# Patient Record
Sex: Female | Born: 1955 | Race: White | Hispanic: No | Smoking: Former smoker
Health system: Southern US, Community
[De-identification: ages and names within clinical notes are randomized; demographics above are authoritative.]

## PROBLEM LIST (undated history)

## (undated) DIAGNOSIS — K219 Gastro-esophageal reflux disease without esophagitis: Secondary | ICD-10-CM

## (undated) DIAGNOSIS — I10 Essential (primary) hypertension: Secondary | ICD-10-CM

## (undated) DIAGNOSIS — M1990 Unspecified osteoarthritis, unspecified site: Secondary | ICD-10-CM

## (undated) DIAGNOSIS — F419 Anxiety disorder, unspecified: Secondary | ICD-10-CM

## (undated) DIAGNOSIS — S2239XA Fracture of one rib, unspecified side, initial encounter for closed fracture: Secondary | ICD-10-CM

## (undated) DIAGNOSIS — N3281 Overactive bladder: Secondary | ICD-10-CM

## (undated) DIAGNOSIS — E7800 Pure hypercholesterolemia, unspecified: Secondary | ICD-10-CM

## (undated) DIAGNOSIS — S42409A Unspecified fracture of lower end of unspecified humerus, initial encounter for closed fracture: Secondary | ICD-10-CM

## (undated) DIAGNOSIS — F329 Major depressive disorder, single episode, unspecified: Secondary | ICD-10-CM

## (undated) DIAGNOSIS — M549 Dorsalgia, unspecified: Secondary | ICD-10-CM

## (undated) DIAGNOSIS — F32A Depression, unspecified: Secondary | ICD-10-CM

## (undated) HISTORY — DX: Gastro-esophageal reflux disease without esophagitis: K21.9

## (undated) HISTORY — DX: Overactive bladder: N32.81

## (undated) HISTORY — DX: Depression, unspecified: F32.A

## (undated) HISTORY — PX: TUBAL LIGATION: SHX77

## (undated) HISTORY — DX: Anxiety disorder, unspecified: F41.9

## (undated) HISTORY — DX: Major depressive disorder, single episode, unspecified: F32.9

## (undated) HISTORY — DX: Fracture of one rib, unspecified side, initial encounter for closed fracture: S22.39XA

## (undated) HISTORY — PX: HEMORRHOID SURGERY: SHX153

## (undated) HISTORY — DX: Unspecified osteoarthritis, unspecified site: M19.90

## (undated) HISTORY — DX: Dorsalgia, unspecified: M54.9

## (undated) HISTORY — DX: Essential (primary) hypertension: I10

## (undated) HISTORY — DX: Unspecified fracture of lower end of unspecified humerus, initial encounter for closed fracture: S42.409A

## (undated) HISTORY — PX: WRIST SURGERY: SHX841

---

## 1984-01-22 HISTORY — PX: ABDOMINAL HYSTERECTOMY: SHX81

## 1992-01-22 HISTORY — PX: CERVICAL DISC SURGERY: SHX588

## 2000-03-21 ENCOUNTER — Encounter: Payer: Self-pay | Admitting: Family Medicine

## 2000-03-21 LAB — CONVERTED CEMR LAB

## 2002-02-02 ENCOUNTER — Encounter: Payer: Self-pay | Admitting: General Surgery

## 2002-02-05 ENCOUNTER — Ambulatory Visit (HOSPITAL_COMMUNITY): Admission: RE | Admit: 2002-02-05 | Discharge: 2002-02-05 | Payer: Self-pay | Admitting: General Surgery

## 2002-02-05 ENCOUNTER — Encounter (INDEPENDENT_AMBULATORY_CARE_PROVIDER_SITE_OTHER): Payer: Self-pay | Admitting: *Deleted

## 2003-01-22 HISTORY — PX: ANKLE SURGERY: SHX546

## 2003-12-13 ENCOUNTER — Ambulatory Visit: Payer: Self-pay | Admitting: Professional

## 2004-09-27 ENCOUNTER — Ambulatory Visit: Payer: Self-pay | Admitting: Family Medicine

## 2005-03-03 ENCOUNTER — Emergency Department: Payer: Self-pay | Admitting: General Practice

## 2005-03-11 ENCOUNTER — Emergency Department (HOSPITAL_COMMUNITY): Admission: EM | Admit: 2005-03-11 | Discharge: 2005-03-11 | Payer: Self-pay | Admitting: Emergency Medicine

## 2005-03-12 ENCOUNTER — Emergency Department (HOSPITAL_COMMUNITY): Admission: EM | Admit: 2005-03-12 | Discharge: 2005-03-12 | Payer: Self-pay | Admitting: *Deleted

## 2005-04-16 ENCOUNTER — Ambulatory Visit: Payer: Self-pay | Admitting: Family Medicine

## 2005-05-17 ENCOUNTER — Ambulatory Visit: Payer: Self-pay | Admitting: Family Medicine

## 2005-07-05 ENCOUNTER — Ambulatory Visit: Payer: Self-pay | Admitting: Family Medicine

## 2005-11-25 ENCOUNTER — Ambulatory Visit: Payer: Self-pay | Admitting: Family Medicine

## 2005-12-02 ENCOUNTER — Ambulatory Visit: Payer: Self-pay | Admitting: Family Medicine

## 2005-12-25 ENCOUNTER — Ambulatory Visit: Payer: Self-pay | Admitting: Family Medicine

## 2006-04-07 ENCOUNTER — Ambulatory Visit: Payer: Self-pay | Admitting: Family Medicine

## 2006-06-19 ENCOUNTER — Emergency Department: Payer: Self-pay | Admitting: General Practice

## 2006-06-26 ENCOUNTER — Encounter: Payer: Self-pay | Admitting: Family Medicine

## 2006-06-26 DIAGNOSIS — F411 Generalized anxiety disorder: Secondary | ICD-10-CM

## 2006-06-26 DIAGNOSIS — I10 Essential (primary) hypertension: Secondary | ICD-10-CM | POA: Insufficient documentation

## 2006-06-26 DIAGNOSIS — K649 Unspecified hemorrhoids: Secondary | ICD-10-CM | POA: Insufficient documentation

## 2006-06-26 DIAGNOSIS — F329 Major depressive disorder, single episode, unspecified: Secondary | ICD-10-CM

## 2006-06-26 DIAGNOSIS — K219 Gastro-esophageal reflux disease without esophagitis: Secondary | ICD-10-CM

## 2006-06-27 ENCOUNTER — Ambulatory Visit: Payer: Self-pay | Admitting: Family Medicine

## 2006-06-27 DIAGNOSIS — N951 Menopausal and female climacteric states: Secondary | ICD-10-CM | POA: Insufficient documentation

## 2006-07-01 ENCOUNTER — Ambulatory Visit: Payer: Self-pay | Admitting: Family Medicine

## 2006-07-04 ENCOUNTER — Telehealth (INDEPENDENT_AMBULATORY_CARE_PROVIDER_SITE_OTHER): Payer: Self-pay | Admitting: *Deleted

## 2006-07-29 ENCOUNTER — Ambulatory Visit: Payer: Self-pay | Admitting: Family Medicine

## 2006-07-29 LAB — CONVERTED CEMR LAB
ALT: 11 units/L (ref 0–35)
AST: 15 units/L (ref 0–37)
Albumin: 3.6 g/dL (ref 3.5–5.2)
Alkaline Phosphatase: 91 units/L (ref 39–117)
BUN: 10 mg/dL (ref 6–23)
Basophils Absolute: 0.1 10*3/uL (ref 0.0–0.1)
Basophils Relative: 1.3 % — ABNORMAL HIGH (ref 0.0–1.0)
Bilirubin, Direct: 0.1 mg/dL (ref 0.0–0.3)
CO2: 29 meq/L (ref 19–32)
Calcium: 8.8 mg/dL (ref 8.4–10.5)
Chloride: 110 meq/L (ref 96–112)
Cholesterol: 264 mg/dL (ref 0–200)
Creatinine, Ser: 1 mg/dL (ref 0.4–1.2)
Direct LDL: 199.5 mg/dL
Eosinophils Absolute: 0.3 10*3/uL (ref 0.0–0.6)
Eosinophils Relative: 4.6 % (ref 0.0–5.0)
GFR calc Af Amer: 76 mL/min
GFR calc non Af Amer: 63 mL/min
Glucose, Bld: 80 mg/dL (ref 70–99)
HCT: 37.2 % (ref 36.0–46.0)
HDL: 28.3 mg/dL — ABNORMAL LOW (ref >39.0)
Hemoglobin: 13 g/dL (ref 12.0–15.0)
Lymphocytes Relative: 46.5 % — ABNORMAL HIGH (ref 12.0–46.0)
MCHC: 35 g/dL (ref 30.0–36.0)
MCV: 94.4 fL (ref 78.0–100.0)
Monocytes Absolute: 0.7 10*3/uL (ref 0.2–0.7)
Monocytes Relative: 9.6 % (ref 3.0–11.0)
Neutro Abs: 2.7 10*3/uL (ref 1.4–7.7)
Neutrophils Relative %: 38 % — ABNORMAL LOW (ref 43.0–77.0)
Platelets: 315 10*3/uL (ref 150–400)
Potassium: 4.4 meq/L (ref 3.5–5.1)
RBC: 3.94 M/uL (ref 3.87–5.11)
RDW: 12.2 % (ref 11.5–14.6)
Sodium: 141 meq/L (ref 135–145)
TSH: 3.49 microintl units/mL (ref 0.35–5.50)
Total Bilirubin: 0.4 mg/dL (ref 0.3–1.2)
Total CHOL/HDL Ratio: 9.3
Total Protein: 6.4 g/dL (ref 6.0–8.3)
Triglycerides: 195 mg/dL — ABNORMAL HIGH (ref 0–149)
VLDL: 39 mg/dL (ref 0–40)
WBC: 7 10*3/uL (ref 4.5–10.5)

## 2006-07-31 ENCOUNTER — Encounter: Payer: Self-pay | Admitting: Family Medicine

## 2006-07-31 ENCOUNTER — Ambulatory Visit: Payer: Self-pay | Admitting: Family Medicine

## 2006-07-31 ENCOUNTER — Other Ambulatory Visit: Admission: RE | Admit: 2006-07-31 | Discharge: 2006-07-31 | Payer: Self-pay | Admitting: Family Medicine

## 2006-07-31 DIAGNOSIS — E7800 Pure hypercholesterolemia, unspecified: Secondary | ICD-10-CM

## 2006-07-31 DIAGNOSIS — E782 Mixed hyperlipidemia: Secondary | ICD-10-CM | POA: Insufficient documentation

## 2006-08-06 ENCOUNTER — Encounter (INDEPENDENT_AMBULATORY_CARE_PROVIDER_SITE_OTHER): Payer: Self-pay | Admitting: *Deleted

## 2006-08-25 ENCOUNTER — Telehealth (INDEPENDENT_AMBULATORY_CARE_PROVIDER_SITE_OTHER): Payer: Self-pay | Admitting: *Deleted

## 2006-08-27 ENCOUNTER — Encounter: Payer: Self-pay | Admitting: Family Medicine

## 2006-08-27 ENCOUNTER — Ambulatory Visit: Payer: Self-pay | Admitting: Family Medicine

## 2006-08-29 ENCOUNTER — Encounter (INDEPENDENT_AMBULATORY_CARE_PROVIDER_SITE_OTHER): Payer: Self-pay | Admitting: *Deleted

## 2006-09-02 ENCOUNTER — Ambulatory Visit: Payer: Self-pay | Admitting: Family Medicine

## 2006-09-02 ENCOUNTER — Telehealth (INDEPENDENT_AMBULATORY_CARE_PROVIDER_SITE_OTHER): Payer: Self-pay | Admitting: *Deleted

## 2006-09-08 LAB — CONVERTED CEMR LAB
ALT: 13 units/L (ref 0–35)
AST: 13 units/L (ref 0–37)

## 2006-09-16 ENCOUNTER — Telehealth: Payer: Self-pay | Admitting: Family Medicine

## 2006-10-30 ENCOUNTER — Ambulatory Visit: Payer: Self-pay | Admitting: Family Medicine

## 2006-10-30 LAB — CONVERTED CEMR LAB
ALT: 46 units/L — ABNORMAL HIGH (ref 0–35)
AST: 36 units/L (ref 0–37)
BUN: 8 mg/dL (ref 6–23)
CO2: 28 meq/L (ref 19–32)
Calcium: 9.1 mg/dL (ref 8.4–10.5)
Chloride: 106 meq/L (ref 96–112)
Cholesterol: 143 mg/dL (ref 0–200)
Creatinine, Ser: 1 mg/dL (ref 0.4–1.2)
GFR calc Af Amer: 76 mL/min
GFR calc non Af Amer: 63 mL/min
Glucose, Bld: 94 mg/dL (ref 70–99)
HDL: 26.9 mg/dL — ABNORMAL LOW (ref >39.0)
LDL Cholesterol: 92 mg/dL (ref 0–99)
Potassium: 3.9 meq/L (ref 3.5–5.1)
Sodium: 140 meq/L (ref 135–145)
Total CHOL/HDL Ratio: 5.3
Triglycerides: 123 mg/dL (ref 0–149)
VLDL: 25 mg/dL (ref 0–40)

## 2006-11-03 ENCOUNTER — Ambulatory Visit: Payer: Self-pay | Admitting: Family Medicine

## 2006-12-25 ENCOUNTER — Ambulatory Visit: Payer: Self-pay | Admitting: Family Medicine

## 2006-12-31 ENCOUNTER — Ambulatory Visit: Payer: Self-pay | Admitting: Family Medicine

## 2006-12-31 DIAGNOSIS — G8929 Other chronic pain: Secondary | ICD-10-CM | POA: Insufficient documentation

## 2006-12-31 DIAGNOSIS — M549 Dorsalgia, unspecified: Secondary | ICD-10-CM | POA: Insufficient documentation

## 2007-02-03 ENCOUNTER — Telehealth (INDEPENDENT_AMBULATORY_CARE_PROVIDER_SITE_OTHER): Payer: Self-pay | Admitting: *Deleted

## 2007-03-16 ENCOUNTER — Telehealth: Payer: Self-pay | Admitting: Family Medicine

## 2007-03-17 ENCOUNTER — Telehealth: Payer: Self-pay | Admitting: Family Medicine

## 2007-03-25 ENCOUNTER — Ambulatory Visit: Payer: Self-pay | Admitting: Family Medicine

## 2007-04-02 ENCOUNTER — Ambulatory Visit: Payer: Self-pay | Admitting: Family Medicine

## 2007-04-20 ENCOUNTER — Ambulatory Visit: Payer: Self-pay | Admitting: Family Medicine

## 2007-05-08 ENCOUNTER — Ambulatory Visit: Payer: Self-pay | Admitting: Family Medicine

## 2007-06-17 ENCOUNTER — Encounter: Payer: Self-pay | Admitting: Family Medicine

## 2007-07-03 ENCOUNTER — Telehealth: Payer: Self-pay | Admitting: Family Medicine

## 2007-07-13 ENCOUNTER — Telehealth: Payer: Self-pay | Admitting: Family Medicine

## 2007-07-27 ENCOUNTER — Encounter (INDEPENDENT_AMBULATORY_CARE_PROVIDER_SITE_OTHER): Payer: Self-pay | Admitting: *Deleted

## 2007-08-07 ENCOUNTER — Telehealth: Payer: Self-pay | Admitting: Family Medicine

## 2007-09-17 ENCOUNTER — Telehealth (INDEPENDENT_AMBULATORY_CARE_PROVIDER_SITE_OTHER): Payer: Self-pay | Admitting: *Deleted

## 2007-10-22 ENCOUNTER — Encounter: Payer: Self-pay | Admitting: Family Medicine

## 2007-11-21 ENCOUNTER — Emergency Department: Payer: Self-pay | Admitting: Emergency Medicine

## 2007-12-10 ENCOUNTER — Encounter: Payer: Self-pay | Admitting: Family Medicine

## 2007-12-15 ENCOUNTER — Ambulatory Visit: Payer: Self-pay | Admitting: Family Medicine

## 2007-12-15 LAB — CONVERTED CEMR LAB
ALT: 16 units/L (ref 0–35)
AST: 16 units/L (ref 0–37)
Albumin: 3.8 g/dL (ref 3.5–5.2)
Alkaline Phosphatase: 83 units/L (ref 39–117)
BUN: 12 mg/dL (ref 6–23)
Basophils Absolute: 0 10*3/uL (ref 0.0–0.1)
Basophils Relative: 0.4 % (ref 0.0–3.0)
Bilirubin, Direct: 0.1 mg/dL (ref 0.0–0.3)
CO2: 27 meq/L (ref 19–32)
Calcium: 9.1 mg/dL (ref 8.4–10.5)
Chloride: 106 meq/L (ref 96–112)
Cholesterol: 147 mg/dL (ref 0–200)
Creatinine, Ser: 1.1 mg/dL (ref 0.4–1.2)
Eosinophils Absolute: 0.1 10*3/uL (ref 0.0–0.7)
Eosinophils Relative: 1.3 % (ref 0.0–5.0)
GFR calc Af Amer: 68 mL/min
GFR calc non Af Amer: 56 mL/min
Glucose, Bld: 92 mg/dL (ref 70–99)
HCT: 39.6 % (ref 36.0–46.0)
HDL: 37.9 mg/dL — ABNORMAL LOW (ref >39.0)
Hemoglobin: 13.6 g/dL (ref 12.0–15.0)
LDL Cholesterol: 87 mg/dL (ref 0–99)
Lymphocytes Relative: 15.9 % (ref 12.0–46.0)
MCHC: 34.4 g/dL (ref 30.0–36.0)
MCV: 94.1 fL (ref 78.0–100.0)
Monocytes Absolute: 0.6 10*3/uL (ref 0.1–1.0)
Monocytes Relative: 7.2 % (ref 3.0–12.0)
Neutro Abs: 6.5 10*3/uL (ref 1.4–7.7)
Neutrophils Relative %: 75.2 % (ref 43.0–77.0)
Platelets: 180 10*3/uL (ref 150–400)
Potassium: 4.3 meq/L (ref 3.5–5.1)
RBC: 4.21 M/uL (ref 3.87–5.11)
RDW: 11.9 % (ref 11.5–14.6)
Sodium: 138 meq/L (ref 135–145)
TSH: 1.4 microintl units/mL (ref 0.35–5.50)
Total Bilirubin: 0.9 mg/dL (ref 0.3–1.2)
Total CHOL/HDL Ratio: 3.9
Total Protein: 7.4 g/dL (ref 6.0–8.3)
Triglycerides: 109 mg/dL (ref 0–149)
VLDL: 22 mg/dL (ref 0–40)
WBC: 8.6 10*3/uL (ref 4.5–10.5)

## 2007-12-23 ENCOUNTER — Ambulatory Visit: Payer: Self-pay | Admitting: Family Medicine

## 2007-12-23 DIAGNOSIS — K921 Melena: Secondary | ICD-10-CM | POA: Insufficient documentation

## 2008-01-05 ENCOUNTER — Telehealth: Payer: Self-pay | Admitting: Family Medicine

## 2008-01-11 ENCOUNTER — Encounter: Payer: Self-pay | Admitting: Family Medicine

## 2008-01-14 ENCOUNTER — Encounter (INDEPENDENT_AMBULATORY_CARE_PROVIDER_SITE_OTHER): Payer: Self-pay | Admitting: *Deleted

## 2008-01-14 ENCOUNTER — Ambulatory Visit: Payer: Self-pay | Admitting: Family Medicine

## 2008-01-14 LAB — CONVERTED CEMR LAB
OCCULT 1: POSITIVE
OCCULT 2: NEGATIVE
OCCULT 3: NEGATIVE

## 2008-03-09 ENCOUNTER — Telehealth: Payer: Self-pay | Admitting: Family Medicine

## 2008-07-04 ENCOUNTER — Telehealth: Payer: Self-pay | Admitting: Family Medicine

## 2008-10-27 ENCOUNTER — Telehealth: Payer: Self-pay | Admitting: Family Medicine

## 2008-11-04 ENCOUNTER — Encounter: Payer: Self-pay | Admitting: Family Medicine

## 2008-12-29 ENCOUNTER — Emergency Department: Payer: Self-pay | Admitting: Emergency Medicine

## 2008-12-29 ENCOUNTER — Encounter: Payer: Self-pay | Admitting: Family Medicine

## 2008-12-30 ENCOUNTER — Encounter: Payer: Self-pay | Admitting: Family Medicine

## 2009-01-12 ENCOUNTER — Ambulatory Visit: Payer: Self-pay | Admitting: Family Medicine

## 2009-01-18 ENCOUNTER — Telehealth: Payer: Self-pay | Admitting: Family Medicine

## 2009-01-23 ENCOUNTER — Telehealth: Payer: Self-pay | Admitting: Family Medicine

## 2009-01-30 ENCOUNTER — Telehealth: Payer: Self-pay | Admitting: Family Medicine

## 2009-02-27 ENCOUNTER — Telehealth: Payer: Self-pay | Admitting: Family Medicine

## 2009-04-06 ENCOUNTER — Encounter: Payer: Self-pay | Admitting: Family Medicine

## 2009-04-27 ENCOUNTER — Encounter: Payer: Self-pay | Admitting: Family Medicine

## 2009-05-01 ENCOUNTER — Encounter: Payer: Self-pay | Admitting: Family Medicine

## 2009-05-17 ENCOUNTER — Ambulatory Visit: Payer: Self-pay | Admitting: Internal Medicine

## 2009-05-25 ENCOUNTER — Encounter: Payer: Self-pay | Admitting: Family Medicine

## 2009-05-25 ENCOUNTER — Telehealth: Payer: Self-pay | Admitting: Family Medicine

## 2009-06-29 ENCOUNTER — Encounter: Payer: Self-pay | Admitting: Family Medicine

## 2009-08-18 ENCOUNTER — Telehealth: Payer: Self-pay | Admitting: Family Medicine

## 2009-08-28 ENCOUNTER — Telehealth: Payer: Self-pay | Admitting: Family Medicine

## 2009-08-29 ENCOUNTER — Encounter (INDEPENDENT_AMBULATORY_CARE_PROVIDER_SITE_OTHER): Payer: Self-pay | Admitting: *Deleted

## 2009-09-15 ENCOUNTER — Ambulatory Visit: Payer: Self-pay | Admitting: Family Medicine

## 2009-09-15 ENCOUNTER — Encounter: Payer: Self-pay | Admitting: Family Medicine

## 2009-10-30 ENCOUNTER — Telehealth: Payer: Self-pay | Admitting: Family Medicine

## 2009-11-20 ENCOUNTER — Telehealth: Payer: Self-pay | Admitting: Family Medicine

## 2009-12-05 ENCOUNTER — Encounter: Payer: Self-pay | Admitting: Family Medicine

## 2010-01-10 ENCOUNTER — Ambulatory Visit: Payer: Self-pay | Admitting: Family Medicine

## 2010-01-10 LAB — CONVERTED CEMR LAB
ALT: 18 units/L (ref 0–35)
AST: 18 units/L (ref 0–37)
BUN: 16 mg/dL (ref 6–23)
Basophils Relative: 0.8 % (ref 0.0–3.0)
Bilirubin, Direct: 0.1 mg/dL (ref 0.0–0.3)
Chloride: 109 meq/L (ref 96–112)
Eosinophils Relative: 4 % (ref 0.0–5.0)
GFR calc non Af Amer: 60.98 mL/min (ref >60.00)
HCT: 37.4 % (ref 36.0–46.0)
LDL Cholesterol: 72 mg/dL (ref 0–99)
Lymphs Abs: 2.5 10*3/uL (ref 0.7–4.0)
Monocytes Relative: 7.3 % (ref 3.0–12.0)
Neutrophils Relative %: 49.1 % (ref 43.0–77.0)
Platelets: 163 10*3/uL (ref 150.0–400.0)
Potassium: 3.7 meq/L (ref 3.5–5.1)
RBC: 3.9 M/uL (ref 3.87–5.11)
Total Bilirubin: 0.7 mg/dL (ref 0.3–1.2)
Total Protein: 6.7 g/dL (ref 6.0–8.3)
VLDL: 17.2 mg/dL (ref 0.0–40.0)
WBC: 6.4 10*3/uL (ref 4.5–10.5)

## 2010-01-17 ENCOUNTER — Telehealth: Payer: Self-pay | Admitting: Family Medicine

## 2010-01-18 ENCOUNTER — Ambulatory Visit: Payer: Self-pay | Admitting: Family Medicine

## 2010-01-18 ENCOUNTER — Other Ambulatory Visit
Admission: RE | Admit: 2010-01-18 | Discharge: 2010-01-18 | Payer: Self-pay | Source: Home / Self Care | Admitting: Family Medicine

## 2010-01-18 LAB — CONVERTED CEMR LAB: Pap Smear: NORMAL

## 2010-01-31 ENCOUNTER — Encounter (INDEPENDENT_AMBULATORY_CARE_PROVIDER_SITE_OTHER): Payer: Self-pay | Admitting: *Deleted

## 2010-02-06 ENCOUNTER — Encounter: Payer: Self-pay | Admitting: Family Medicine

## 2010-02-06 ENCOUNTER — Ambulatory Visit: Payer: Self-pay

## 2010-02-08 ENCOUNTER — Encounter: Payer: Self-pay | Admitting: Family Medicine

## 2010-02-08 ENCOUNTER — Ambulatory Visit: Payer: Self-pay

## 2010-02-15 ENCOUNTER — Telehealth (INDEPENDENT_AMBULATORY_CARE_PROVIDER_SITE_OTHER): Payer: Self-pay | Admitting: *Deleted

## 2010-02-20 NOTE — Progress Notes (Signed)
Summary: refill request for alprazolam  Phone Note Refill Request Message from:  Fax from Pharmacy  Refills Requested: Medication #1:  ALPRAZOLAM 1 MG TABS take one by mouth two times a day   Last Refilled: 07/22/2009 Faxed request from [facility], [phone].  Initial call taken by: Lowella Petties CMA,  August 18, 2009 3:04 PM  Follow-up for Phone Call        signed.  Follow-up by: Crawford Givens MD,  August 18, 2009 3:09 PM  Additional Follow-up for Phone Call Additional follow up Details #1::        Medication phoned to pharmacy.  Additional Follow-up by: Delilah Shan CMA Filipe Greathouse Dull),  August 18, 2009 4:41 PM    Prescriptions: ALPRAZOLAM 1 MG TABS (ALPRAZOLAM) take one by mouth two times a day  #60 x 2   Entered and Authorized by:   Crawford Givens MD   Signed by:   Crawford Givens MD on 08/18/2009   Method used:   Print then Give to Patient   RxID:   [id number]

## 2010-02-20 NOTE — Progress Notes (Signed)
Summary: [facility]  Phone Note Refill Request Message from:  Ozzie Hoyle [phone] on October 30, 2009 9:48 AM  faxed request for [facility] 15mg , not on patient active or non-active med list. Last filled 11/07/08   Method Requested: Electronic Initial call taken by: Mervin Hack CMA Duncan Dull),  October 30, 2009 9:49 AM  Follow-up for Phone Call        tried calling pt's number in chart, number has been disconnected. Will call the pharmacy to ask the pt to call the office. DeShannon Katrinka Blazing CMA Duncan Dull)  October 30, 2009 9:50 AM   Additional Follow-up for Phone Call Additional follow up Details #1::        I agree with the above.  This needs clarification before refilling it.  No reply to me needed, unless extra information becomes available.  Additional Follow-up by: Crawford Givens MD,  October 30, 2009 1:49 PM    Additional Follow-up for Phone Call Additional follow up Details #2::    Called and spoke to pharmacist Thayer Ohm) and was informed that this rx was transferred from [facility] on 11/07/08 and the rx had 6 refills when they got this rx from them and that the rx was written by Dr. Hetty Ely. Thayer Ohm states that they have refilled it 3 times since it was transferred and the refills have now expired. Unable to reach patient at phone numbers and requested that Thayer Ohm have patient call the office with more information regarding this refill request. Sydell Axon LPN  October 30, 2009 4:49 PM  Follow-up by: Crawford Givens MD,  October 30, 2009 5:01 PM  Additional Follow-up for Phone Call Additional follow up Details #3:: Details for Additional Follow-up Action Taken: agreed, will await further details.  Additional Follow-up by: Crawford Givens MD,  October 30, 2009 5:01 PM   Appended Document: [facility] Patient called in.  Phone number was changed in EMR.  Patient requests this Rx.  She has an appt. with Dr. Hetty Ely in December.  Appended Document: [facility] Will you refill this until December?  Appended Document:  [facility] See script sent to [facility].  Appended Document: [facility] Patient notified that script was sent to [facility] per Dr. Hetty Ely.

## 2010-02-20 NOTE — Progress Notes (Signed)
Summary: Rx-Xanax  Phone Note Refill Request Message from:  Fax from Pharmacy on February 27, 2009 9:00 AM  Refills Requested: Medication #1:  ALPRAZOLAM 1 MG TABS take one by mouth two times a day   Last Refilled: 01/28/2009 #60. [facility] [address] ph# [phone] fax# [phone]  Initial call taken by: Silas Sacramento CMA,  February 27, 2009 9:03 AM  Follow-up for Phone Call        Rx called to pharmacy Follow-up by: Sydell Axon LPN,  February 27, 2009 10:21 AM    Prescriptions: ALPRAZOLAM 1 MG TABS (ALPRAZOLAM) take one by mouth two times a day  #60 x 2   Entered and Authorized by:   Shaune Leeks MD   Signed by:   Shaune Leeks MD on 02/27/2009   Method used:   Telephoned to ...       [facility] [address] (retail)       [address]       [place], [place]  [address]       Ph: [phone]       Fax: [phone]   RxID:   [id number]

## 2010-02-20 NOTE — Medication Information (Signed)
Summary: [facility] [employer] [facility]-Accupril,Lipitor  [facility] Assistance [facility]-Accupril,Lipitor   Imported By: Beau Fanny 04/07/2009 09:16:56  _____________________________________________________________________  External Attachment:    Type:   Image     Comment:   External Document
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
## 2010-02-20 NOTE — Progress Notes (Signed)
Summary: refill request for mobic  Phone Note Refill Request Message from:  Fax from Pharmacy  Refills Requested: Medication #1:  mobic 15 mg's Faxed request from [facility], this is not on med list.  Initial call taken by: Lowella Petties CMA,  August 28, 2009 3:01 PM  Follow-up for Phone Call        not on med list.  please call patient and verify all meds and then send it back to me.  thanks.  Follow-up by: Crawford Givens MD,  August 28, 2009 9:58 PM  Additional Follow-up for Phone Call Additional follow up Details #1::        Pt.s VM states that her mailbox is full so I could not leave a message.  That is the only phone number we have for her.  I phoned the pharmacy and relayed this message to them and to ask her to call this office.  Lugene Fuquay CMA (AAMA)  August 29, 2009 10:14 AM

## 2010-02-20 NOTE — Medication Information (Signed)
Summary: [facility] Patient Application  [facility] Patient Application   Imported By: Beau Fanny 01/24/2009 15:47:39  _____________________________________________________________________  External Attachment:    Type:   Image     Comment:   External Document

## 2010-02-20 NOTE — Medication Information (Signed)
Summary: Armed forces technical officer to Care-notification pt has been accepted  Armed forces technical officer to Care-notification pt has been accepted   Imported By: Beau Fanny 07/11/2009 10:43:19  _____________________________________________________________________  External Attachment:    Type:   Image     Comment:   External Document

## 2010-02-20 NOTE — Letter (Signed)
Summary: Beth Parker letter  [place] at [facility]  [address] [place], [place] [address]   Phone: [phone]  Fax: [phone]       08/29/2009 MRN: [id number]  [facility] [address] [place], [place]  [address]  Dear Beth Parker,  [facility] [place] and [place] announce the retirement of Beth Parker, M.D., from full-time practice at the [facility] office effective July 20, 2009 and his plans of returning part-time.  It is important to Beth Parker and to our practice that you understand that [facility] has seven physicians in our office for your health care needs.  We will continue to offer the same exceptional care that you have today.    Beth Parker has spoken to many of you about his plans for retirement and returning part-time in the fall.   We will continue to work with you through the transition to schedule appointments for you in the office and meet the high standards that [facility] is committed to.   Again, it is with great pleasure that we share the news that Beth Parker will return to [facility] in October of 2011 with a reduced schedule.    If you have any questions, or would like to request an appointment with one of our physicians, please call us at [phone] and press the option for Scheduling an appointment.  We take pleasure in providing you with excellent patient care and look forward to seeing you at your next office visit.  Our [facility] Physicians are:  Beth Parker, M.D. Beth Parker, M.D. Beth Parker, M.D. Beth Parker, M.D. Beth Parker, M.D. Beth Parker, M.D. We proudly welcomed Beth Parker, M.D. and Beth Parker, M.D. to the practice in July/August 2011.  Sincerely,  Beth Parker [facility]

## 2010-02-20 NOTE — Miscellaneous (Signed)
  Clinical Lists Changes  Medications: Rx of ACCUPRIL 20 MG TABS (QUINAPRIL HCL) 1 DAILY BY MOUTH;  #90 x 3;  Signed;  Entered by: Shaune Leeks MD;  Authorized by: Shaune Leeks MD;  Method used: Print then Give to Patient Rx of LIPITOR 40 MG TABS (ATORVASTATIN CALCIUM) one tab by mouth at night.;  #90 x 3;  Signed;  Entered by: Shaune Leeks MD;  Authorized by: Shaune Leeks MD;  Method used: Print then Give to Patient    Prescriptions: LIPITOR 40 MG TABS (ATORVASTATIN CALCIUM) one tab by mouth at night.  #90 x 3   Entered and Authorized by:   Shaune Leeks MD   Signed by:   Shaune Leeks MD on 04/06/2009   Method used:   Print then Give to Patient   RxID:   [id number] ACCUPRIL 20 MG TABS (QUINAPRIL HCL) 1 DAILY BY MOUTH  #90 x 3   Entered and Authorized by:   Shaune Leeks MD   Signed by:   Shaune Leeks MD on 04/06/2009   Method used:   Print then Give to Patient   RxID:   [id number]

## 2010-02-20 NOTE — Progress Notes (Signed)
Summary: refill request for xanax  Phone Note Refill Request Message from:  Fax from Pharmacy  Refills Requested: Medication #1:  ALPRAZOLAM 1 MG TABS take one by mouth two times a day   Last Refilled: 10/21/2009 Faxed request from [place], [phone].  Initial call taken by: Lowella Petties CMA, AAMA,  November 20, 2009 9:16 AM  Follow-up for Phone Call        needs 30 min OV before next round of refills.  please call in.  Follow-up by: Crawford Givens MD,  November 20, 2009 9:38 AM  Additional Follow-up for Phone Call Additional follow up Details #1::        The person that answered this phone says that this is not [facility]'s number.  Left message at pharmacy for patient to return call.  Delilah Shan CMA Duncan Dull)  November 20, 2009 10:13 AM   Patient returned call.  Phone number changed in EMR.  Pt. has appt. with Dr. Hetty Ely in December.  Medication phoned to pharmacy. Lugene Fuquay CMA (AAMA)  November 21, 2009 9:10 AM     Prescriptions: ALPRAZOLAM 1 MG TABS (ALPRAZOLAM) take one by mouth two times a day  #60 x 2   Entered and Authorized by:   Crawford Givens MD   Signed by:   Crawford Givens MD on 11/20/2009   Method used:   Telephoned to ...         RxID:   [id number]

## 2010-02-20 NOTE — Medication Information (Signed)
Summary: [facility] Patient Assistance Program Form  [employer] Patient Assistance [employer] Form   Imported By: Beau Fanny 12/20/2009 13:46:09  _____________________________________________________________________  External Attachment:    Type:   Image     Comment:   External Document

## 2010-02-20 NOTE — Letter (Signed)
Summary: [employer] notification of pt's appt. w/   [employer]ability [employer] Services notification of pt's appt. w/ Dr. Toy Cookey   Imported By: Beau Fanny 05/26/2009 09:59:16  _____________________________________________________________________  External Attachment:    Type:   Image     Comment:   External Document

## 2010-02-20 NOTE — Progress Notes (Signed)
Summary: RX Alprazolam  Phone Note Refill Request Call back at [phone] Message from:  [facility] on January 30, 2009 9:36 AM  Refills Requested: Medication #1:  ALPRAZOLAM 1 MG TABS take one by mouth two times a day   Last Refilled: 12/28/2008 Received faxed refill request.   Method Requested: Telephone to Pharmacy Initial call taken by: Sydell Axon LPN,  January 30, 2009 9:37 AM  Follow-up for Phone Call        she got 60 on 12/23 (directions are for two times a day )-- ? is early to refill Follow-up by: Judith Part MD,  January 30, 2009 10:26 AM  Additional Follow-up for Phone Call Additional follow up Details #1::        Spoke to pharmacist and was informed that patient did get #60 on 01/12/09. Pharmacist Rosanne Ashing) will inform patient that refill is too early. Sydell Axon LPN  January 30, 2009 11:09 AM

## 2010-02-20 NOTE — Medication Information (Signed)
Summary: [facility] Form-Protonix  [facility] Form-Protonix   Imported By: Beau Fanny 05/02/2009 10:22:50  _____________________________________________________________________  External Attachment:    Type:   Image     Comment:   External Document

## 2010-02-20 NOTE — Progress Notes (Signed)
Summary: Rx Alprazolam  Phone Note Refill Request Call back at [phone] Message from:  [facility] on May 25, 2009 8:13 AM  Refills Requested: Medication #1:  ALPRAZOLAM 1 MG TABS take one by mouth two times a day Received faxed refill request please advise.   Method Requested: Telephone to Pharmacy Initial call taken by: Linde Gillis CMA Duncan Dull),  May 25, 2009 8:14 AM  Follow-up for Phone Call        Rx called to [facility] pharmacy Follow-up by: Linde Gillis CMA Duncan Dull),  May 25, 2009 2:11 PM    Prescriptions: ALPRAZOLAM 1 MG TABS (ALPRAZOLAM) take one by mouth two times a day  #60 x 2   Entered and Authorized by:   Shaune Leeks MD   Signed by:   Shaune Leeks MD on 05/25/2009   Method used:   Telephoned to ...         RxID:   [id number]

## 2010-02-22 ENCOUNTER — Ambulatory Visit (INDEPENDENT_AMBULATORY_CARE_PROVIDER_SITE_OTHER): Payer: Self-pay | Admitting: Family Medicine

## 2010-02-22 ENCOUNTER — Encounter: Payer: Self-pay | Admitting: Family Medicine

## 2010-02-22 DIAGNOSIS — S8000XA Contusion of unspecified knee, initial encounter: Secondary | ICD-10-CM

## 2010-02-22 DIAGNOSIS — S139XXA Sprain of joints and ligaments of unspecified parts of neck, initial encounter: Secondary | ICD-10-CM
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
## 2010-02-22 NOTE — Letter (Signed)
Summary: Results Follow up Letter  [facility]  [address] [place], [place] [address]   Phone: [phone]  Fax: [phone]    01/31/2010 MRN: [id number]    [facility] [address] [place], [place]  [address]    Dear Ms. Mesa,  The following are the results of your recent test(s):  Test         Result    Pap Smear:        Normal __X___  Not Normal _____ Comments: Please repeat in one year. ______________________________________________________ Cholesterol: LDL(Bad cholesterol):         Your goal is less than:         HDL (Good cholesterol):       Your goal is more than: Comments:  ______________________________________________________ Mammogram:        Normal _____  Not Normal _____ Comments:  ___________________________________________________________________ Hemoccult:        Normal _____  Not normal _______ Comments:    _____________________________________________________________________ Other Tests:    We routinely do not discuss normal results over the telephone.  If you desire a copy of the results, or you have any questions about this information we can discuss them at your next office visit.   Sincerely,    Laurita Quint, MD

## 2010-02-22 NOTE — Progress Notes (Signed)
Summary: refil request for mobic  Phone Note Refill Request Message from:  Fax from Pharmacy  Refills Requested: Medication #1:  MOBIC 15 MG TABS one tab by mouth once daily as needed.   Last Refilled: 11/22/2009 Faxed request from [facility].  Initial call taken by: Lowella Petties CMA, AAMA,  January 17, 2010 10:00 AM  Follow-up for Phone Call        Will hold until seen tomm. Follow-up by: Shaune Leeks MD,  January 17, 2010 1:24 PM

## 2010-02-22 NOTE — Progress Notes (Signed)
Summary: copy of med list mailed  Phone Note Call from Patient   Caller: Patient Summary of Call: Pt requests a copy of her med list be mailed to her home address.  Copy mailed. Initial call taken by: Lowella Petties CMA, AAMA,  February 15, 2010 2:41 PM
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
## 2010-03-09 ENCOUNTER — Telehealth: Payer: Self-pay | Admitting: Family Medicine

## 2010-03-14 NOTE — Progress Notes (Signed)
Summary: refill request for vicodin  Phone Note Refill Request Message from:  Fax from Pharmacy  Refills Requested: Medication #1:  VICODIN ES 7.5-750 MG TABS one tab by mouth three times a day for pain.   Last Refilled: Dec 20, 1957 Faxed request from [facility].  Initial call taken by: Lowella Petties CMA, AAMA,  March 09, 2010 8:36 AM  Follow-up for Phone Call        please call in.  If pain is not improved, please notify Dr. Hetty Ely for patient to go to ortho.  Follow-up by: Crawford Givens MD,  March 09, 2010 9:10 AM  Additional Follow-up for Phone Call Additional follow up Details #1::        Patient Advised. Medication phoned to pharmacy.  Additional Follow-up by: Delilah Shan CMA (AAMA),  March 09, 2010 9:27 AM    Prescriptions: VICODIN ES 7.5-750 MG TABS (HYDROCODONE-ACETAMINOPHEN) one tab by mouth three times a day for pain.  #60 x 0   Entered and Authorized by:   Crawford Givens MD   Signed by:   Crawford Givens MD on 03/09/2010   Method used:   Telephoned to ...         RxID:   [id number]

## 2010-04-05 ENCOUNTER — Encounter: Payer: Self-pay | Admitting: Family Medicine

## 2010-04-10 NOTE — Miscellaneous (Signed)
  Clinical Lists Changes  Observations: Added new observation of PAST SURG HX: Caesarean section Hemorrhoidectomy- int and ext Hysterectomy- R ovary remains. (cramps/pain) (1986) Tubal ligation Disk surgery (Dr Gerrit Heck) (1994) Right wrist surgery- ganglion cyst EGD- esophagitis (Dr Sharen Hint)  (08/25/1997) INT&EXT HEMMorhoidectomies (Dr Derrell Lolling)  (01/2002) L ankle surgery (2005) R Brest Biopsy Benign (02/28/2010) (04/05/2010 17:27) Added new observation of CHIEF CMPLNT: Preventive Care (04/05/2010 17:27) Added new observation of MAMMOGRAM: Abnormal Right, See U/S Report (02/08/2010 17:30) Added new observation of MAMMOGRAM: Normal Left, Abnormal Right (02/06/2010 17:28)      PAP Screening:    Last PAP smear:  01/18/2010  Mammogram Screening:    Last Mammogram:  02/08/2010  Mammogram Results:    Date of Exam:  02/08/2010    Results:  Abnormal Right, See U/S Report  Mammogram Comments:    Suspicious lesion, referred to surgery.  Osteoporosis Risk Assessment:  Risk Factors for Fracture or Low Bone Density:   Race (White or Asian):     yes   Smoking status:       quit  Immunization & Chemoprophylaxis:    Tetanus vaccine: Td  (06/19/2006)    Past Surgical History:    Caesarean section    Hemorrhoidectomy- int and ext    Hysterectomy- R ovary remains. (cramps/pain) (1986)    Tubal ligation    Disk surgery (Dr Gerrit Heck) (1994)    Right wrist surgery- ganglion cyst    EGD- esophagitis (Dr Sharen Hint)  (08/25/1997)    INT&EXT HEMMorhoidectomies (Dr Derrell Lolling)  (01/2002)    L ankle surgery (2005)    R Brest Biopsy Benign (02/28/2010)

## 2010-04-16 ENCOUNTER — Encounter: Payer: Self-pay | Admitting: Family Medicine

## 2010-04-19 ENCOUNTER — Telehealth: Payer: Self-pay | Admitting: *Deleted

## 2010-04-19 ENCOUNTER — Ambulatory Visit: Payer: Self-pay | Admitting: Family Medicine

## 2010-04-19 DIAGNOSIS — M549 Dorsalgia, unspecified: Secondary | ICD-10-CM

## 2010-04-19 NOTE — Telephone Encounter (Signed)
She is at risk for taking NSAIDs due tio her past stomach issues and I will not prescribe narcotics. I would suggest she see ortho. She can take Tyl ES 2 tabs 3 times a day.

## 2010-04-19 NOTE — Telephone Encounter (Signed)
Patient had an appt for today, but called and cancelled it. She is now calling back asking if she can get something called in for her knee and back pain. Uses [facility] [address]

## 2010-04-19 NOTE — Telephone Encounter (Signed)
I would also suggest she start on Glucosamine per label.

## 2010-04-20 NOTE — Telephone Encounter (Signed)
Spoke with patient, she is agreeable to seeing ortho, but says that she will need referral. She says that she prefers going to [facility].

## 2010-04-25 ENCOUNTER — Encounter: Payer: Self-pay | Admitting: Family Medicine

## 2010-04-25 ENCOUNTER — Ambulatory Visit (INDEPENDENT_AMBULATORY_CARE_PROVIDER_SITE_OTHER): Payer: Self-pay | Admitting: Family Medicine

## 2010-04-25 DIAGNOSIS — F411 Generalized anxiety disorder: Secondary | ICD-10-CM

## 2010-04-25 DIAGNOSIS — F329 Major depressive disorder, single episode, unspecified: Secondary | ICD-10-CM

## 2010-04-25 DIAGNOSIS — I10 Essential (primary) hypertension: Secondary | ICD-10-CM

## 2010-04-25 DIAGNOSIS — F3289 Other specified depressive episodes: Secondary | ICD-10-CM

## 2010-04-25 MED ORDER — FLUOXETINE HCL 40 MG PO CAPS: 40.0000 mg | ORAL_CAPSULE | Freq: Every day | ORAL | Status: DC

## 2010-04-25 MED ORDER — ALPRAZOLAM 1 MG PO TABS: 1.0000 mg | ORAL_TABLET | Freq: Two times a day (BID) | ORAL | Status: DC

## 2010-04-25 NOTE — Patient Instructions (Addendum)
RTC 3 mos for recheck. Hopefully decrease Xanax back to bid then.

## 2010-04-25 NOTE — Assessment & Plan Note (Signed)
Stable. BP: 100/60 mmHg

## 2010-04-25 NOTE — Assessment & Plan Note (Signed)
Anxiety is elevated due to situational problems. Will increase Meds to help deal with this and hopefully be able to decrease in the future.

## 2010-04-25 NOTE — Assessment & Plan Note (Signed)
Sounds to be handling this part of the psychologic problem ok today. But will adjust meds due to anxiousness.

## 2010-04-25 NOTE — Progress Notes (Signed)
Addended by: Sydell Axon on: 04/25/2010 12:36 PM   Modules accepted: Orders
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
## 2010-04-26 MED ORDER — FLUOXETINE HCL 40 MG PO CAPS: 40.0000 mg | ORAL_CAPSULE | Freq: Every day | ORAL | Status: DC

## 2010-04-26 NOTE — Progress Notes (Signed)
Addended by: Laurita Quint on: 04/26/2010 05:53 PM   Modules accepted: Orders

## 2010-06-08 NOTE — Op Note (Signed)
NAME:  ALEAHA, FICKLING                          ACCOUNT NO.:  [id number]   MEDICAL RECORD NO.:  [id number]                   PATIENT TYPE:  AMB   LOCATION:  DAY                                  FACILITY:  [facility]   PHYSICIAN:  Angelia Mould. Derrell Lolling, M.D.             DATE OF BIRTH:  1955/04/20   DATE OF PROCEDURE:  02/05/2002  DATE OF DISCHARGE:                                 OPERATIVE REPORT   PREOPERATIVE DIAGNOSIS:  Internal and external hemorrhoids.   POSTOPERATIVE DIAGNOSIS:  Internal and external hemorrhoids.   OPERATION PERFORMED:  1. Rigid proctoscopy.  2. Internal and external hemorrhoidectomy.   SURGEON:  Angelia Mould. Derrell Lolling, M.D.   ANESTHESIA:   INDICATIONS FOR PROCEDURE:  The patient is a 55 year old white female who  has had hemorrhoids for many years.  More recently, she has had significant  protrusion and pain but minimal bleeding.  This has been getting very  uncomfortable for her and is interfering with her lifestyle.  On exam, she  has huge external hemorrhoids in the right posterior position which are  keratinized and completely epithelialized.  On anoscopy, she has small  internal hemorrhoids, right anterior and right posterior and there is  nothing on the left side.  The patient was brought to the operating room  electively.   DESCRIPTION OF PROCEDURE:  Following induction of general endotracheal  anesthesia, the patient was placed in dorsal lithotomy position.  The  patient's perianal area was prepped and draped in sterile fashion.  Rigid  proctoscopy was carried out to about 18 or 19 cm.  That was as far as I  could go because of poor prep.  I did wash out everything I could and the  rectal mucosa looks normal.   We then recleansed the perianal skin.  Anoscope was inserted after a gentle  rectal dilatation.  I injected the internal hemorrhoids, right anterior and  right posterior with 0.5% Marcaine with epinephrine, 9 cc mixed with Wydase  1 cc.  I then  injected all of the perianal tissues fairly extensively with  about 25 cc of 0.5% Marcaine with epinephrine.   Anoscope was inserted.  I excised hemorrhoids in the right posterior  position which was the large symptomatic area and I also excised hemorrhoids  in the right anterior position which were smaller.  Nothing was required in  the left lateral.  I did both areas on the right side with a similar  technique which will be dictated a single time.   A transfixion figure-of-eight suture of 2-0 chromic was placed above the  hemorrhoidal pile in the rectal mucosa being careful to take bites of mucosa  only.  Using electrocautery I scored the area of intended excision of mucosa  and skin being very conservative on the anoderm.  All of the hemorrhoidal  piles were excised all the way down to the level of the internal sphincter  but the internal sphincter was left intact.  Some mucosal dissection was  required to get all of the venous plexus.  Hemostasis was excellent and  achieved with electrocautery.  I closed the rectal mucosa, dentate line, and  anoderm with running sutures of 2-0 chromic.  This was done right anterior  and right posterior.  I had a nice mucosal bridge between the two suture  lines.  I observed the wound for over five minutes and there really was no  bleeding.  External bandage was placed and the patient taken to recovery  room in stable condition.  The estimated blood loss was about 20 cc.  Complications were none.  Sponge, needle and instrument counts were correct.                                                Angelia Mould. Derrell Lolling, M.D.    HMI/MEDQ  D:  02/05/2002  T:  02/05/2002  Job:  [id number]   cc:   Laurita Quint, M.D.  [address] [place]  [place] [address]  Fax: [phone]

## 2010-06-13 ENCOUNTER — Ambulatory Visit (INDEPENDENT_AMBULATORY_CARE_PROVIDER_SITE_OTHER): Payer: Self-pay | Admitting: Family Medicine

## 2010-06-13 ENCOUNTER — Encounter: Payer: Self-pay | Admitting: Family Medicine

## 2010-06-13 DIAGNOSIS — N6310 Unspecified lump in the right breast, unspecified quadrant: Secondary | ICD-10-CM | POA: Insufficient documentation

## 2010-06-13 DIAGNOSIS — M549 Dorsalgia, unspecified: Secondary | ICD-10-CM

## 2010-06-13 DIAGNOSIS — N63 Unspecified lump in unspecified breast: Secondary | ICD-10-CM

## 2010-06-13 MED ORDER — HYDROCODONE-ACETAMINOPHEN 7.5-750 MG PO TABS: 1.0000 | ORAL_TABLET | Freq: Three times a day (TID) | ORAL | Status: DC | PRN

## 2010-06-13 NOTE — Patient Instructions (Signed)
Refer to Dr Lemar Livings for right breast mass. Was recem ntly seen for excisional biopsy.

## 2010-06-13 NOTE — Assessment & Plan Note (Signed)
Feels to be scar tissue. She was just seen and treated by Dr Lemar Livings for what sounds like excisional biopsy so will have her see him to eval and decide on mammo or other avenue.

## 2010-06-13 NOTE — Progress Notes (Signed)
  Subjective:    Patient ID: Beth Parker, female    DOB: November 30, 1955, 55 y.o.   MRN: [id number]  HPI Pt here for breast knot in the right breast. She has been feeling this for the last couple of months. She has never been aware of this previously. No nipple discharge nor changes of the skin. It is slightly tender to palpation and feels to her to be the size of a pea, slightly larger than a BB. She has had a spot removed from the right breast at [facility] approx 8 mos ago by unknown doctor. There is no record of this in the chart.. The lump was removed from the RLQ of the right breast and she now feels the pea in the RUQ.  She also complains of back pain today, a chronic problem for her. She has been evaluated multiple times by multiple people in the past for this and typically takes pain medication as needed.     Review of SystemsNoncontributory except as above.       Objective:   Physical Exam  Pulmonary/Chest:       L breast pendulous but nml. Right breast with pea-sized superficial scar-tissue feeling lesion at he upper end of what appears to be a strretchmark or old small laceration, mobile and nontender.  Back mobile, ROM minimally limited, Gait nml.        Assessment & Plan:
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
## 2010-06-25 ENCOUNTER — Other Ambulatory Visit: Payer: Self-pay | Admitting: Family Medicine

## 2010-06-25 NOTE — Telephone Encounter (Signed)
Is it okay to refill this? Please advise dispense amount and number of refills.

## 2010-06-25 NOTE — Telephone Encounter (Signed)
Rx called to pharmacy

## 2010-07-06 ENCOUNTER — Other Ambulatory Visit: Payer: Self-pay | Admitting: Family Medicine

## 2010-07-06 NOTE — Telephone Encounter (Signed)
Please call in

## 2010-07-09 ENCOUNTER — Other Ambulatory Visit: Payer: Self-pay | Admitting: Family Medicine

## 2010-07-09 NOTE — Telephone Encounter (Signed)
Rx called to [facility]. 

## 2010-07-10 ENCOUNTER — Telehealth: Payer: Self-pay | Admitting: *Deleted

## 2010-07-10 NOTE — Telephone Encounter (Signed)
Forms from [facility] and [employer] are on your desk.  This is Dr. Lorenza Chick pt but [employer] is asking if you can sign the forms for patient assistance and write out new scripts for accupril and lipitor.

## 2010-07-10 NOTE — Telephone Encounter (Signed)
I'll address the hard copy.  

## 2010-07-11 ENCOUNTER — Other Ambulatory Visit: Payer: Self-pay | Admitting: *Deleted

## 2010-07-11 MED ORDER — FLUOXETINE HCL 20 MG PO CAPS: 60.0000 mg | ORAL_CAPSULE | Freq: Every day | ORAL | Status: DC

## 2010-07-11 NOTE — Telephone Encounter (Signed)
This was sent to me about a refill on the xanax.  It was sent in on 04/25/10 with refills.  Please clarify with the pharmacy.  Thanks.

## 2010-07-11 NOTE — Telephone Encounter (Signed)
She was upped to 40mg , 1.5 a day to make 60mg  total.  I changed it in the chart.  Please call it to her pharmacy.  Thanks.

## 2010-07-11 NOTE — Telephone Encounter (Signed)
Patient says that Dr. Hetty Ely recently increase her prozac to 60 mg daily, but we have 40 mg in chart. She says that her insurance will not cover the 60 mg, but if rx can be written for the 20 mg taking 3 daily they would cover. Please advise.

## 2010-07-13 ENCOUNTER — Telehealth: Payer: Self-pay | Admitting: *Deleted

## 2010-07-13 NOTE — Telephone Encounter (Signed)
Rx called to pharmacy and to clarify dosage and instructions.

## 2010-07-13 NOTE — Telephone Encounter (Signed)
Opened in error

## 2010-07-18 NOTE — Telephone Encounter (Signed)
Prozac called to [facility]- pt needed the 20 mg's capsules, to take 3 a day because she couldn't half the capsules to take one and a half a day.

## 2010-07-20 ENCOUNTER — Other Ambulatory Visit: Payer: Self-pay | Admitting: Family Medicine

## 2010-07-21 NOTE — Telephone Encounter (Signed)
Is it okay to refill this ?  

## 2010-07-22 NOTE — Telephone Encounter (Signed)
Please clarify this with pharmacy.  This appears to be too soon.  Let me know.  Thanks.

## 2010-07-23 NOTE — Telephone Encounter (Signed)
Spoke to pharmacist and was advised that she picked #30 up on 07/20/10, which would be a 10 day supply.

## 2010-07-23 NOTE — Telephone Encounter (Signed)
This is too soon.  Deny this.  Please forward to Dr. Hetty Ely as a FYI.  Thanks.

## 2010-07-24 NOTE — Telephone Encounter (Signed)
Pharmacist Thayer Ohm) notified as instructed by telephone. Sent to Dr. Hetty Ely for his review.

## 2010-07-25 NOTE — Telephone Encounter (Incomplete)
Please let pt know, eventhough the script is written as it is, I expect her to take no more than an average of one a day. In other words, 30 should last her one month and we have discussed that.

## 2010-07-26 ENCOUNTER — Ambulatory Visit: Payer: Self-pay | Admitting: Family Medicine

## 2010-07-26 NOTE — Telephone Encounter (Signed)
Spoke with patient and advised her as instructed.

## 2010-07-26 NOTE — Telephone Encounter (Signed)
Left message for patient to call back  

## 2010-08-01 ENCOUNTER — Other Ambulatory Visit: Payer: Self-pay | Admitting: Family Medicine

## 2010-08-01 MED ORDER — FLUOXETINE HCL 20 MG PO CAPS: 60.0000 mg | ORAL_CAPSULE | Freq: Every day | ORAL | Status: DC

## 2010-08-02 ENCOUNTER — Other Ambulatory Visit: Payer: Self-pay | Admitting: *Deleted

## 2010-08-02 NOTE — Telephone Encounter (Signed)
Written script for prozac mailed to [facility].

## 2010-08-17 ENCOUNTER — Other Ambulatory Visit: Payer: Self-pay | Admitting: Family Medicine

## 2010-08-17 MED ORDER — PANTOPRAZOLE SODIUM 40 MG PO TBEC: 40.0000 mg | DELAYED_RELEASE_TABLET | Freq: Two times a day (BID) | ORAL | Status: DC

## 2010-08-23 ENCOUNTER — Other Ambulatory Visit: Payer: Self-pay | Admitting: Family Medicine

## 2010-08-23 HISTORY — PX: BREAST BIOPSY: SHX20

## 2010-08-25 ENCOUNTER — Emergency Department: Payer: Self-pay | Admitting: Emergency Medicine

## 2010-08-29 ENCOUNTER — Ambulatory Visit: Payer: Self-pay

## 2010-08-30 ENCOUNTER — Ambulatory Visit (INDEPENDENT_AMBULATORY_CARE_PROVIDER_SITE_OTHER): Payer: Self-pay | Admitting: Family Medicine

## 2010-08-30 ENCOUNTER — Encounter: Payer: Self-pay | Admitting: Family Medicine

## 2010-08-30 DIAGNOSIS — I10 Essential (primary) hypertension: Secondary | ICD-10-CM

## 2010-08-30 MED ORDER — METOPROLOL SUCCINATE ER 100 MG PO TB24: 100.0000 mg | ORAL_TABLET | Freq: Every day | ORAL | Status: DC

## 2010-08-30 NOTE — Assessment & Plan Note (Signed)
Mildly high today but significantly higher when seen in the ER with pain of knee and back from a fall. Will increase Metoprolol to 100mg  a day and change dosing of this XL equivalent medication to once a day at night. She is currently on 25mg  bid, eqiuv to 25mg x2 at night. Will increase to 25mg  x 3 for one week and then go to 25mg  x 4 until out of current pills and start 100mg  at night with prescription given today. See back in two months. BP Readings from Last 3 Encounters:  08/30/10 134/88  06/13/10 108/70  04/25/10 100/60

## 2010-08-30 NOTE — Progress Notes (Signed)
  Subjective:    Patient ID: Beth Parker, female    DOB: November 08, 1955, 55 y.o.   MRN: [id number]  HPI Pt here for high BP. She was seen at [facility] ER for knee pain and her BP was 170/95 on repeat after being slightly higher initially. She had not missed a lot of her medication for BP was out of her Prozac for a week prior. She was having a lot of pain when seen from her knee and her back. She had fallen down the back steps and was not paying attention as she was going to make a phonecall on her mobile phone. Her knee occas gives out on her and cannot afford to see the orthopedist. She hit her knee and twisted it (Xray at ER negative for fracture) and her back causes chronic pain but unable to afford to see an Ortho.  SHe knows to restrict NSAID use due to her BP and says she does not use this class of medications. She had her mammo yesterday. She has not yet seen Dr Lemar Livings again but has appt to see him 8/.23 (reason for her last visit here was pea sized lesion she felt in her breast.)    Review of Systems  Constitutional: Negative for fever, chills, diaphoresis, activity change and fatigue.  HENT: Negative for ear pain, congestion, rhinorrhea and postnasal drip.   Eyes: Negative for redness.  Respiratory: Negative for cough, chest tightness, shortness of breath and wheezing.   Cardiovascular: Negative for chest pain.  Musculoskeletal: Positive for back pain (chronic, stable for long time.).  Neurological: Negative for dizziness, tremors and light-headedness.       Reason for fall seems to be inattention.       Objective:   Physical Exam        Assessment & Plan:

## 2010-08-30 NOTE — Telephone Encounter (Signed)
Rx called to pharmacy

## 2010-08-30 NOTE — Patient Instructions (Signed)
RTC 2 mos for BP recheck. Start taking Metoprolol (Toprol XL equiv) 4 pills at night (currently taking one twice a day) Take 3 pills at night for the first week then go to four.  Given new prescription to take the place of the current Toprol prescription.

## 2010-08-30 NOTE — Telephone Encounter (Signed)
Opened in error

## 2010-09-10 ENCOUNTER — Other Ambulatory Visit: Payer: Self-pay | Admitting: *Deleted

## 2010-09-10 NOTE — Telephone Encounter (Signed)
Received refill request electronically from pharmacy. Is it okay to refill medications? Please verify dispense amount and number of refills.

## 2010-09-10 NOTE — Telephone Encounter (Signed)
Request rx's for xanax and vicodin sent to [facility]

## 2010-09-10 NOTE — Telephone Encounter (Signed)
Okay #30 x 0 of the hydrocodone The alprazolam was refilled for 6 months in April. Not due till October as fas as I can tell. Please check with the pharmacy about whether she used all them. This may need to wait for Dr Loni Muse review

## 2010-09-11 NOTE — Telephone Encounter (Signed)
Please check to see if this was handled

## 2010-09-11 NOTE — Telephone Encounter (Signed)
This is a duplicate request. Refill request already sent to Dr. Hetty Ely.

## 2010-09-12 ENCOUNTER — Other Ambulatory Visit: Payer: Self-pay | Admitting: *Deleted

## 2010-09-12 MED ORDER — ALPRAZOLAM 1 MG PO TABS: 1.0000 mg | ORAL_TABLET | Freq: Two times a day (BID) | ORAL | Status: DC

## 2010-09-12 MED ORDER — HYDROCODONE-ACETAMINOPHEN 7.5-750 MG PO TABS: ORAL_TABLET | ORAL | Status: DC

## 2010-09-12 NOTE — Telephone Encounter (Signed)
Dr Hetty Ely, requests came in earlier in the week but somehow got lost in system.  Patient would like to pick these up today.  Uses [facility] [address]

## 2010-09-12 NOTE — Telephone Encounter (Signed)
Medicines [facility] to [facility], pt advised.

## 2010-09-26 ENCOUNTER — Other Ambulatory Visit: Payer: Self-pay | Admitting: Family Medicine

## 2010-09-26 NOTE — Telephone Encounter (Signed)
Rx called to pharmacy

## 2010-09-26 NOTE — Telephone Encounter (Signed)
Received faxed refill request from pharmacy. Medication was last refilled on 09/12/10 with #30. Is it okay to refill medication?

## 2010-10-02 ENCOUNTER — Telehealth: Payer: Self-pay | Admitting: *Deleted
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
## 2010-10-03 MED ORDER — ALPRAZOLAM 1 MG PO TABS: 1.0000 mg | ORAL_TABLET | Freq: Two times a day (BID) | ORAL | Status: DC

## 2010-10-03 NOTE — Telephone Encounter (Signed)
Patient notified as instructed by telephone. Rx called to pharmacy. 

## 2010-10-03 NOTE — Telephone Encounter (Signed)
Pt may have 30 more. She just got 75 tablets less than one month ago. Please be more careful with how many of these she is taking.

## 2010-10-08 ENCOUNTER — Telehealth: Payer: Self-pay | Admitting: *Deleted

## 2010-10-08 NOTE — Telephone Encounter (Signed)
Dr Sherin Quarry called to let you know that he is seeing pt and she will be given psychotropic meds.  Please call him if you need to discuss.  Pt will be seeing you later in the week.

## 2010-10-10 ENCOUNTER — Other Ambulatory Visit: Payer: Self-pay | Admitting: Family Medicine

## 2010-10-10 NOTE — Telephone Encounter (Signed)
Medication phoned to pharmacy.  

## 2010-10-11 ENCOUNTER — Ambulatory Visit: Payer: Self-pay | Admitting: Family Medicine

## 2010-10-26 ENCOUNTER — Other Ambulatory Visit: Payer: Self-pay | Admitting: Family Medicine

## 2010-10-28 NOTE — Telephone Encounter (Signed)
I denied the rx.  According to Schaller's last rx note, it was done in 9/12 and that should have lasted 2 months.  Will need to be seen to discuss, she had OV with Hetty Ely this week. Please forward to him as a FYI.  Thanks.

## 2010-10-29 ENCOUNTER — Other Ambulatory Visit: Payer: Self-pay | Admitting: Family Medicine

## 2010-10-29 NOTE — Telephone Encounter (Signed)
Received refill request electronically from pharmacy. Is it okay to refill medication? 

## 2010-10-29 NOTE — Telephone Encounter (Signed)
This was supposed to last two months. It has been one. She has appt to see me Wed. Will discuss then.

## 2010-10-31 ENCOUNTER — Encounter: Payer: Self-pay | Admitting: Family Medicine

## 2010-10-31 ENCOUNTER — Ambulatory Visit (INDEPENDENT_AMBULATORY_CARE_PROVIDER_SITE_OTHER): Payer: Self-pay | Admitting: Family Medicine

## 2010-10-31 DIAGNOSIS — S96919A Strain of unspecified muscle and tendon at ankle and foot level, unspecified foot, initial encounter: Secondary | ICD-10-CM

## 2010-10-31 DIAGNOSIS — M549 Dorsalgia, unspecified: Secondary | ICD-10-CM

## 2010-10-31 DIAGNOSIS — G4720 Circadian rhythm sleep disorder, unspecified type: Secondary | ICD-10-CM

## 2010-10-31 DIAGNOSIS — S8000XA Contusion of unspecified knee, initial encounter: Secondary | ICD-10-CM

## 2010-10-31 DIAGNOSIS — G4700 Insomnia, unspecified: Secondary | ICD-10-CM | POA: Insufficient documentation

## 2010-10-31 DIAGNOSIS — S93409A Sprain of unspecified ligament of unspecified ankle, initial encounter: Secondary | ICD-10-CM

## 2010-10-31 MED ORDER — HYDROCODONE-ACETAMINOPHEN 7.5-750 MG PO TABS: 1.0000 | ORAL_TABLET | Freq: Three times a day (TID) | ORAL | Status: DC | PRN

## 2010-10-31 NOTE — Patient Instructions (Signed)
Avoid NSAIDS. Limit Vicodin to 30 a month. Use heat and ice. Use sleep hygiene to improve sleep pattern.

## 2010-10-31 NOTE — Assessment & Plan Note (Signed)
Discussed sleep hygiene and method of reestablishing good sleep pattern. Avoid medication, make bedroom cool, dark, quiet place. Routine going to bed and when to get up.

## 2010-10-31 NOTE — Assessment & Plan Note (Signed)
Continues. Shown gentle ROM exercises shown. Use Vicodin as needed no more than an average of one a day, 30 a month. Use heat and ice as discussed.

## 2010-10-31 NOTE — Assessment & Plan Note (Signed)
Shown ROM exercises to be done regularly for one month and then go to strengthening exercises. Heat and ice as needed.
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
## 2010-10-31 NOTE — Assessment & Plan Note (Signed)
Try knee brace she has for a few weeks to see if bracing helps but do not use chronically as my opinion is braces weaken intrinsic stability over the long term. Use heat and ice. Avoid lateral stress. Avoid NSAIDS. Use Tyl prn. Shown quad strengthening exercises.

## 2010-11-27 ENCOUNTER — Other Ambulatory Visit: Payer: Self-pay | Admitting: Family Medicine

## 2010-11-27 NOTE — Telephone Encounter (Signed)
Received refill request electronically from pharmacy. Is it okay to refill medication? 

## 2010-11-27 NOTE — Telephone Encounter (Signed)
Rx called to pharmacy

## 2010-12-19 ENCOUNTER — Other Ambulatory Visit: Payer: Self-pay | Admitting: Family Medicine

## 2010-12-19 NOTE — Telephone Encounter (Signed)
Received refill request electronically from pharmacy. Is it okay to refill medication? 

## 2010-12-19 NOTE — Telephone Encounter (Signed)
Rx called to pharmacy as instructed. 

## 2010-12-29 ENCOUNTER — Emergency Department: Payer: Self-pay | Admitting: Internal Medicine

## 2011-01-02 ENCOUNTER — Ambulatory Visit (INDEPENDENT_AMBULATORY_CARE_PROVIDER_SITE_OTHER): Payer: Self-pay | Admitting: Family Medicine

## 2011-01-02 ENCOUNTER — Encounter: Payer: Self-pay | Admitting: Family Medicine

## 2011-01-02 DIAGNOSIS — I10 Essential (primary) hypertension: Secondary | ICD-10-CM

## 2011-01-02 DIAGNOSIS — M272 Inflammatory conditions of jaws: Secondary | ICD-10-CM

## 2011-01-02 MED ORDER — HYDROCODONE-ACETAMINOPHEN 7.5-750 MG PO TABS: ORAL_TABLET | ORAL | Status: DC

## 2011-01-02 NOTE — Progress Notes (Signed)
  Subjective:    Patient ID: Beth Parker, female    DOB: Jan 26, 1955, 55 y.o.   MRN: [id number]  HPI  Pt here as acute appt after being seen in the ER at [facility] for left lower jaw infection, having had an abscess I&D'd. She was given Clindamycin 300mg  tid for ten days and told to use heat. She has not soaked but continues to take her medication which she is tolerating well. She also says she was given Vicodin for pain which continues to bother her from the jaw toward the ear on the left side. She continues to have pain and requests more pain medication.    Review of SystemsNoncontributory except as above.       Objective:   Physical ExamSmall 5-8 mm scab on the left lower jaw with approx 1cm sclerotic area below the skin surrounding the scab, no fluctulance felt, no erythema and no warmth.        Assessment & Plan:

## 2011-01-02 NOTE — Assessment & Plan Note (Signed)
BP slightly high today but has been great in the past. Will follow and recheck next appt. BP Readings from Last 3 Encounters:  01/02/11 140/88  10/31/10 120/80  08/30/10 134/88

## 2011-01-02 NOTE — Assessment & Plan Note (Signed)
Resolving. Finish clindamycin and start using hot soaks routinely. See instructions. Will give 15 more Vicodin for pain to augment her routine once a day Vicodin. Taper per the prescription. RTC if infection worsens.

## 2011-01-02 NOTE — Patient Instructions (Signed)
Use heat applied to jaw regularly for the next few weeks, at least three times a day for a week. Augment regular dose of Vicodin with extra Vicodin for max of three a day and rapidly taper back to usual once a day dosing.

## 2011-01-17 ENCOUNTER — Other Ambulatory Visit: Payer: Self-pay | Admitting: Family Medicine

## 2011-01-17 NOTE — Telephone Encounter (Signed)
Received refill request electronically from pharmacy. Is it okay to refill medication? 

## 2011-01-17 NOTE — Telephone Encounter (Signed)
Rx called to pharmacy

## 2011-01-31 ENCOUNTER — Emergency Department: Payer: Self-pay | Admitting: *Deleted

## 2011-02-04 ENCOUNTER — Emergency Department: Payer: Self-pay | Admitting: Emergency Medicine

## 2011-02-04 ENCOUNTER — Other Ambulatory Visit: Payer: Self-pay | Admitting: Family Medicine

## 2011-02-04 LAB — COMPREHENSIVE METABOLIC PANEL
Albumin: 3.6 g/dL (ref 3.4–5.0)
BUN: 12 mg/dL (ref 7–18)
Bilirubin,Total: 0.4 mg/dL (ref 0.2–1.0)
Chloride: 108 mmol/L — ABNORMAL HIGH (ref 98–107)
Creatinine: 0.93 mg/dL (ref 0.60–1.30)
EGFR (African American): >60
Glucose: 88 mg/dL (ref 65–99)
SGPT (ALT): 33 U/L
Total Protein: 7.3 g/dL (ref 6.4–8.2)

## 2011-02-04 LAB — CBC
HGB: 12.2 g/dL (ref 12.0–16.0)
MCH: 31.1 pg (ref 26.0–34.0)
MCV: 94 fL (ref 80–100)
RBC: 3.93 10*6/uL (ref 3.80–5.20)
WBC: 6.8 10*3/uL (ref 3.6–11.0)

## 2011-02-04 NOTE — Telephone Encounter (Signed)
Received refill electronically from pharmacy. Is it okay to refill medication? 

## 2011-02-05 NOTE — Telephone Encounter (Signed)
Medication phoned to pharmacy.  

## 2011-02-05 NOTE — Telephone Encounter (Signed)
Please call in.  Per Dr. Lorenza Chick note, she was to average 1 pill a day.

## 2011-02-07 ENCOUNTER — Encounter: Payer: Self-pay | Admitting: Family Medicine

## 2011-02-07 ENCOUNTER — Ambulatory Visit (INDEPENDENT_AMBULATORY_CARE_PROVIDER_SITE_OTHER): Payer: Self-pay | Admitting: Family Medicine

## 2011-02-07 VITALS — BP 148/90 | HR 77 | Temp 98.3°F | Wt 184.0 lb

## 2011-02-07 DIAGNOSIS — S2239XA Fracture of one rib, unspecified side, initial encounter for closed fracture: Secondary | ICD-10-CM

## 2011-02-07 DIAGNOSIS — S2231XA Fracture of one rib, right side, initial encounter for closed fracture: Secondary | ICD-10-CM | POA: Insufficient documentation
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
## 2011-02-07 NOTE — Patient Instructions (Signed)
You can start taking ibuprofen 200mg  (3 tabs at a time with food, up to 3 times a day) on the 21st.   Keep taking the percocet for now and let me know how you are doing with the pain next week.  Recheck CXR in 6 weeks.  Schedule that on the way out.  No office visit with me for that, just the CXR.

## 2011-02-07 NOTE — Assessment & Plan Note (Signed)
Continue current pain meds until percocet is used up, then taper to vicodin.  She already has rx for vicodin.  Can start ibuprofen on the 21st, GI caution given.  D/w pt about using a pillow to cushion her chest with cough.  Lungs clear, no reason to extend the abx.  Return for repeat CXR in 6 weeks.  She agrees.

## 2011-02-22 ENCOUNTER — Other Ambulatory Visit: Payer: Self-pay | Admitting: *Deleted

## 2011-02-22 MED ORDER — HYDROCODONE-ACETAMINOPHEN 7.5-500 MG PO TABS: 1.0000 | ORAL_TABLET | Freq: Three times a day (TID) | ORAL | Status: DC | PRN

## 2011-02-22 NOTE — Telephone Encounter (Signed)
[facility] sent fax stating that patient wants her Vicodin refilled due to cracked rib.

## 2011-02-22 NOTE — Telephone Encounter (Signed)
Medication phoned to pharmacy.  

## 2011-02-22 NOTE — Telephone Encounter (Signed)
Please call in

## 2011-02-25 ENCOUNTER — Ambulatory Visit: Payer: Self-pay | Admitting: Family Medicine

## 2011-03-01 ENCOUNTER — Emergency Department: Payer: Self-pay | Admitting: Emergency Medicine

## 2011-03-06 ENCOUNTER — Ambulatory Visit: Payer: Self-pay | Admitting: Family Medicine

## 2011-03-07 ENCOUNTER — Ambulatory Visit (INDEPENDENT_AMBULATORY_CARE_PROVIDER_SITE_OTHER): Payer: Self-pay | Admitting: Family Medicine

## 2011-03-07 ENCOUNTER — Encounter: Payer: Self-pay | Admitting: Family Medicine

## 2011-03-07 DIAGNOSIS — S0993XA Unspecified injury of face, initial encounter: Secondary | ICD-10-CM

## 2011-03-07 MED ORDER — HYDROCODONE-ACETAMINOPHEN 7.5-500 MG PO TABS: 1.0000 | ORAL_TABLET | Freq: Four times a day (QID) | ORAL | Status: DC | PRN
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
## 2011-03-11 ENCOUNTER — Telehealth: Payer: Self-pay | Admitting: Family Medicine

## 2011-03-11 NOTE — Telephone Encounter (Signed)
She can take the current med, 2 tabs at a time, up to 6 a day.  No change in rx/strength.  If pain continues, she'll need further eval here in clinic.
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
## 2011-03-11 NOTE — Telephone Encounter (Signed)
Patient advised.

## 2011-03-19 ENCOUNTER — Telehealth: Payer: Self-pay | Admitting: Family Medicine

## 2011-03-19 ENCOUNTER — Other Ambulatory Visit: Payer: Self-pay | Admitting: *Deleted

## 2011-03-19 MED ORDER — HYDROCODONE-ACETAMINOPHEN 7.5-500 MG PO TABS: 1.0000 | ORAL_TABLET | Freq: Four times a day (QID) | ORAL | Status: DC | PRN

## 2011-03-19 NOTE — Telephone Encounter (Signed)
She needs an OV this week.  Call in 20 pills of vicodin in meantime. Same sig, no refill.  Thanks.
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
## 2011-03-19 NOTE — Telephone Encounter (Signed)
N/A and no A/M.  Shall I still call in the Rx?

## 2011-03-19 NOTE — Telephone Encounter (Signed)
Yes and please try again.  Thanks.

## 2011-03-19 NOTE — Telephone Encounter (Signed)
Medication phoned to pharmacy.  Patient advised.  Appt. Scheduled.

## 2011-03-20 ENCOUNTER — Telehealth: Payer: Self-pay | Admitting: Family Medicine
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
## 2011-03-20 NOTE — Telephone Encounter (Signed)
See prev note, rx was sent yesterday.

## 2011-03-20 NOTE — Telephone Encounter (Signed)
Called and spoke to the patient and was advised that she has already picked the medication up from the pharmacy.

## 2011-03-21 ENCOUNTER — Ambulatory Visit (INDEPENDENT_AMBULATORY_CARE_PROVIDER_SITE_OTHER)
Admission: RE | Admit: 2011-03-21 | Discharge: 2011-03-21 | Disposition: A | Discharge status: Home / Self Care | Payer: Self-pay | Source: Ambulatory Visit | Attending: Family Medicine | Admitting: Family Medicine

## 2011-03-21 ENCOUNTER — Ambulatory Visit (INDEPENDENT_AMBULATORY_CARE_PROVIDER_SITE_OTHER): Payer: Self-pay | Admitting: Family Medicine

## 2011-03-21 ENCOUNTER — Encounter: Payer: Self-pay | Admitting: Family Medicine

## 2011-03-21 DIAGNOSIS — S2239XA Fracture of one rib, unspecified side, initial encounter for closed fracture: Secondary | ICD-10-CM

## 2011-03-21 DIAGNOSIS — S2231XA Fracture of one rib, right side, initial encounter for closed fracture: Secondary | ICD-10-CM

## 2011-03-21 DIAGNOSIS — F3289 Other specified depressive episodes: Secondary | ICD-10-CM

## 2011-03-21 DIAGNOSIS — J069 Acute upper respiratory infection, unspecified: Secondary | ICD-10-CM

## 2011-03-21 DIAGNOSIS — F329 Major depressive disorder, single episode, unspecified: Secondary | ICD-10-CM

## 2011-03-21 NOTE — Patient Instructions (Signed)
Take up to 3 pain pills a day for now, gradually work back down to 2 pills then 1 pill a day average.   Call me next week with an update.  See Shirlee Limerick about your referral before you leave today.
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
## 2011-03-22 DIAGNOSIS — J069 Acute upper respiratory infection, unspecified: Secondary | ICD-10-CM | POA: Insufficient documentation

## 2011-03-22 NOTE — Assessment & Plan Note (Signed)
Likely viral, nontoxic, f/u prn.  Supportive tx in meantime.

## 2011-03-22 NOTE — Assessment & Plan Note (Signed)
She'll need to est with new psych clinic, her old MD's office is closing.  She'll d/w them about where to f/u for psych care.

## 2011-03-22 NOTE — Assessment & Plan Note (Signed)
Negative cxr. Continue pain meds for now, call back next week with update and will wean her pain meds back to baseline.

## 2011-03-23 ENCOUNTER — Other Ambulatory Visit: Payer: Self-pay | Admitting: Family Medicine

## 2011-03-25 ENCOUNTER — Other Ambulatory Visit: Payer: Self-pay | Admitting: Family Medicine

## 2011-03-25 ENCOUNTER — Other Ambulatory Visit: Payer: Self-pay | Admitting: *Deleted

## 2011-03-25 NOTE — Telephone Encounter (Signed)
Electronic refill request

## 2011-03-26 MED ORDER — HYDROCODONE-ACETAMINOPHEN 7.5-500 MG PO TABS: 1.0000 | ORAL_TABLET | Freq: Two times a day (BID) | ORAL | Status: DC | PRN

## 2011-03-26 NOTE — Telephone Encounter (Signed)
1 month supply to be called in.  Will need to last 30 days.  Wean down to bid dosing.

## 2011-03-26 NOTE — Telephone Encounter (Signed)
See next refill request.  

## 2011-03-26 NOTE — Telephone Encounter (Signed)
Medication phoned to pharmacy. Patient advised.  

## 2011-04-24 ENCOUNTER — Encounter: Payer: Self-pay | Admitting: Family Medicine

## 2011-04-24 ENCOUNTER — Ambulatory Visit (INDEPENDENT_AMBULATORY_CARE_PROVIDER_SITE_OTHER): Payer: Self-pay | Admitting: Family Medicine

## 2011-04-24 VITALS — BP 118/80 | HR 72 | Temp 98.3°F | Wt 184.5 lb

## 2011-04-24 DIAGNOSIS — M549 Dorsalgia, unspecified: Secondary | ICD-10-CM

## 2011-04-24 DIAGNOSIS — G43909 Migraine, unspecified, not intractable, without status migrainosus: Secondary | ICD-10-CM

## 2011-04-24 MED ORDER — HYDROCODONE-ACETAMINOPHEN 7.5-500 MG PO TABS: 1.0000 | ORAL_TABLET | Freq: Two times a day (BID) | ORAL | Status: DC | PRN

## 2011-04-24 MED ORDER — METHOCARBAMOL 500 MG PO TABS: 500.0000 mg | ORAL_TABLET | Freq: Four times a day (QID) | ORAL | Status: AC

## 2011-04-24 MED ORDER — MELOXICAM 7.5 MG PO TABS: 7.5000 mg | ORAL_TABLET | Freq: Every day | ORAL | Status: DC
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
## 2011-04-24 NOTE — Patient Instructions (Signed)
Take the hydrocodone as rarely as possible.  Take the mobic with food daily and use the robaxin as needed for muscle spasms or headaches.  Take care.

## 2011-04-25 ENCOUNTER — Encounter: Payer: Self-pay | Admitting: Family Medicine

## 2011-04-25 DIAGNOSIS — G43909 Migraine, unspecified, not intractable, without status migrainosus: Secondary | ICD-10-CM | POA: Insufficient documentation
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
## 2011-05-01 ENCOUNTER — Emergency Department: Payer: Self-pay | Admitting: Emergency Medicine

## 2011-05-02 ENCOUNTER — Telehealth: Payer: Self-pay

## 2011-05-02 DIAGNOSIS — M549 Dorsalgia, unspecified: Secondary | ICD-10-CM

## 2011-05-02 NOTE — Telephone Encounter (Signed)
Pt request referral for pain management for lower back pain radiating down right leg. Pt saw Dr Para March 04/24/11; pt said med not helping pain. Pt can be reached at [phone]

## 2011-05-02 NOTE — Telephone Encounter (Signed)
Referral ordered

## 2011-05-07 ENCOUNTER — Telehealth: Payer: Self-pay | Admitting: Family Medicine

## 2011-05-07 NOTE — Telephone Encounter (Signed)
Noted  

## 2011-05-07 NOTE — Telephone Encounter (Signed)
Spoke with pt to help set up her referral for the center for pain and rehab medicine. She would have to have $365 up front and the patient says she can't do that. She is trying to get [facility] care through [facility] but hasn't got it yet. She is saying that she will call us when she has the charity care and will at that time try her referral again.

## 2011-05-08 ENCOUNTER — Telehealth: Payer: Self-pay | Admitting: Family Medicine

## 2011-05-08 NOTE — Telephone Encounter (Signed)
Patient notified as instructed by telephone. 

## 2011-05-08 NOTE — Telephone Encounter (Signed)
Will not fill early.  She needs to back down to 1 a day on average.

## 2011-05-08 NOTE — Telephone Encounter (Signed)
Caller: William/Spouse; PCP: Crawford Givens [name]); CB#: [phone]; ; ; Call regarding Wants Refill On Pain Medication/Vicodin; per Epic, last Rx for Lortab 7.5/500 #30 written 04/24/11 with a note that states "limit use as much as possible-30 pills for 30 days."  Advised that new Rx may not be written till 05/24/11; patient still requests note be sent to ask for refill.  INFO TO OFFICE FOR PROVIDER REVIEW/RX/CALLBACK. MAY REACH PATIENT AT [phone].

## 2011-05-16 ENCOUNTER — Ambulatory Visit: Payer: Self-pay | Admitting: Family Medicine

## 2011-05-17 ENCOUNTER — Ambulatory Visit (INDEPENDENT_AMBULATORY_CARE_PROVIDER_SITE_OTHER): Payer: Self-pay | Admitting: Family Medicine

## 2011-05-17 ENCOUNTER — Encounter: Payer: Self-pay | Admitting: Family Medicine

## 2011-05-17 VITALS — BP 144/100 | HR 78 | Temp 98.0°F | Wt 188.0 lb

## 2011-05-17 DIAGNOSIS — M25519 Pain in unspecified shoulder: Secondary | ICD-10-CM

## 2011-05-17 DIAGNOSIS — M549 Dorsalgia, unspecified: Secondary | ICD-10-CM

## 2011-05-17 MED ORDER — HYDROCODONE-ACETAMINOPHEN 7.5-500 MG PO TABS: 1.0000 | ORAL_TABLET | Freq: Two times a day (BID) | ORAL | Status: DC | PRN

## 2011-05-17 NOTE — Assessment & Plan Note (Signed)
Likely cuff impingement and would continue current meds for now with ROM.

## 2011-05-17 NOTE — Patient Instructions (Signed)
If you get the phone number for the charity care, I'll call on your behalf.   Take the vicodin, average 1 pill a day.
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
## 2011-05-17 NOTE — Assessment & Plan Note (Signed)
I am treating her pain in good faith.  I would like her to see about the subsidized care as they may be able to provide extra services for diagnositics/treatment at a lower cost.  She agrees.  It's reasonable not to image today as she has documented findings from prev and no emergent sx.  I'll call on her behalf if she'll get me the number.  Continue meds as is for now.  I would like not to escalate her opiates.  Continue other meds in meantime.

## 2011-05-19 ENCOUNTER — Emergency Department (HOSPITAL_COMMUNITY)
Admission: EM | Admit: 2011-05-19 | Discharge: 2011-05-20 | Disposition: A | Discharge status: Home / Self Care | Payer: Self-pay | Attending: Emergency Medicine | Admitting: Emergency Medicine

## 2011-05-19 ENCOUNTER — Emergency Department (HOSPITAL_COMMUNITY): Payer: Self-pay

## 2011-05-19 ENCOUNTER — Encounter (HOSPITAL_COMMUNITY): Payer: Self-pay | Admitting: Emergency Medicine

## 2011-05-19 DIAGNOSIS — R197 Diarrhea, unspecified: Secondary | ICD-10-CM | POA: Insufficient documentation

## 2011-05-19 DIAGNOSIS — R112 Nausea with vomiting, unspecified: Secondary | ICD-10-CM | POA: Insufficient documentation

## 2011-05-19 DIAGNOSIS — R7309 Other abnormal glucose: Secondary | ICD-10-CM | POA: Insufficient documentation

## 2011-05-19 DIAGNOSIS — K219 Gastro-esophageal reflux disease without esophagitis: Secondary | ICD-10-CM | POA: Insufficient documentation

## 2011-05-19 DIAGNOSIS — R739 Hyperglycemia, unspecified: Secondary | ICD-10-CM

## 2011-05-19 HISTORY — DX: Pure hypercholesterolemia, unspecified: E78.00

## 2011-05-19 LAB — COMPREHENSIVE METABOLIC PANEL
AST: 17 U/L (ref 0–37)
Albumin: 4.2 g/dL (ref 3.5–5.2)
Calcium: 9.4 mg/dL (ref 8.4–10.5)
Creatinine, Ser: 0.79 mg/dL (ref 0.50–1.10)

## 2011-05-19 LAB — URINALYSIS, ROUTINE W REFLEX MICROSCOPIC
Glucose, UA: 100 mg/dL — AB
Specific Gravity, Urine: 1.025 (ref 1.005–1.030)
Urobilinogen, UA: 0.2 mg/dL (ref 0.0–1.0)

## 2011-05-19 LAB — DIFFERENTIAL
Basophils Absolute: 0 10*3/uL (ref 0.0–0.1)
Basophils Relative: 0 % (ref 0–1)
Eosinophils Relative: 0 % (ref 0–5)
Monocytes Absolute: 0.3 10*3/uL (ref 0.1–1.0)

## 2011-05-19 LAB — CBC
HCT: 37.6 % (ref 36.0–46.0)
MCHC: 34.3 g/dL (ref 30.0–36.0)
MCV: 90.8 fL (ref 78.0–100.0)
RDW: 13.1 % (ref 11.5–15.5)

## 2011-05-19 LAB — URINE MICROSCOPIC-ADD ON

## 2011-05-19 LAB — LIPASE, BLOOD: Lipase: 13 U/L (ref 11–59)

## 2011-05-19 MED ORDER — ONDANSETRON HCL 4 MG PO TABS: 4.0000 mg | ORAL_TABLET | Freq: Three times a day (TID) | ORAL | Status: AC | PRN

## 2011-05-19 MED ORDER — HYDROCODONE-ACETAMINOPHEN 5-325 MG PO TABS: 1.0000 | ORAL_TABLET | ORAL | Status: AC | PRN

## 2011-05-19 MED ORDER — SODIUM CHLORIDE 0.9 % IV SOLN
1000.0000 mL | Freq: Once | INTRAVENOUS | Status: AC
  Administered 2011-05-19: 1000 mL via INTRAVENOUS

## 2011-05-19 MED ORDER — ONDANSETRON HCL 4 MG/2ML IJ SOLN
4.0000 mg | Freq: Once | INTRAMUSCULAR | Status: AC
  Administered 2011-05-19: 4 mg via INTRAVENOUS
  Filled 2011-05-19: qty 2

## 2011-05-19 MED ORDER — SODIUM CHLORIDE 0.9 % IV SOLN: 1000.0000 mL | INTRAVENOUS | Status: DC

## 2011-05-19 MED ORDER — HYDROMORPHONE HCL PF 1 MG/ML IJ SOLN
1.0000 mg | Freq: Once | INTRAMUSCULAR | Status: AC
  Administered 2011-05-19: 1 mg via INTRAVENOUS
  Filled 2011-05-19: qty 1
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
## 2011-05-19 NOTE — ED Notes (Signed)
C/o nausea, vomiting, and diarrhea since this morning.  Unable to take pain meds for her back pain.

## 2011-05-19 NOTE — Discharge Instructions (Signed)
Please read the information below.  Follow closely with your primary care provider.  Drink plenty of fluids over the next few days.  If you develop fevers, uncontrolled abdominal pain or vomiting, or are unable to tolerate fluids by mouth, return immediately to the ER.  You may return to the ER at any time for worsening condition or any new symptoms that concern you.  B.R.A.T. Diet Your doctor has recommended the B.R.A.T. diet for you or your child until the condition improves. This is often used to help control diarrhea and vomiting symptoms. If you or your child can tolerate clear liquids, you may have:  Bananas.   Rice.   Applesauce.   Toast (and other simple starches such as crackers, potatoes, noodles).  Be sure to avoid dairy products, meats, and fatty foods until symptoms are better. Fruit juices such as apple, grape, and prune juice can make diarrhea worse. Avoid these. Continue this diet for 2 days or as instructed by your caregiver. Document Released: 01/07/2005 Document Revised: 12/27/2010 Document Reviewed: 06/26/2006 [facility] Patient Information 2012 [place], [place].  Hyperglycemia Hyperglycemia occurs when the glucose (sugar) in your blood is too high. Hyperglycemia can happen for many reasons, but it most often happens to people who do not know they have diabetes or are not managing their diabetes properly.  Whether you have diabetes or not, there are other causes of hyperglycemia. Hyperglycemia can occur when you have diabetes, but it can also occur in other situations that you might not be as aware of, such as:  Diabetes.   Pre-diabetes. Before people develop Type 2 diabetes, they almost always have "pre-diabetes." This is when your blood glucose levels are higher than normal, but not yet high enough to be diagnosed as diabetes.   Stress. Stress can elevate your blood glucose.   Taking steroids. One side effect can be a rise in blood glucose.  SYMPTOMS  Signs of  hyperglycemia include having extreme thirst, dry mouth, blurred vision, needing to urinate often, being overly tired, weak or sleepy, or having tingling in the feet or leg. DIAGNOSIS  Diagnosis is made by monitoring blood glucose. TREATMENT  Knowing the cause of the hyperglycemia is important before the hyperglycemia can be treated. Treatment may include, but is not be limited to:  Education.   Change or adjustment in medications, your meal plan, exercise plan or lifestyle.   Treatment for an illness, infection, etc.   More frequent blood glucose monitoring.   Decreasing or stopping steroids.  HOME CARE INSTRUCTIONS   Test your blood glucose as directed.   Exercise regularly.   Eat wholesome, balanced meals. Eat often and at regular, fixed times.   Being at an ideal weight is important. If needed, losing as little as 10 to 15 pounds may help improve blood glucose levels.  SEEK MEDICAL CARE IF:   You have questions about medicine, activity or diet.   You continue to have symptoms such as increased thirst, urination or weight gain.  SEEK IMMEDIATE MEDICAL CARE IF:   You are vomiting or have diarrhea.   Your breath smells fruity.   You are breathing faster or slower.   You are very sleepy or incoherent.   You have numbness, tingling, or pain in your feet or hands.   You have chest pain.   Your symptoms get worse even though you have been following your caregiver's orders.  Document Released: 01/07/2005 Document Revised: 12/27/2010 Document Reviewed: 08/30/2008 [facility] Patient Information 2012 [place], [place].Hyperglycemia Hyperglycemia occurs  when the glucose (sugar) in your blood is too high. Hyperglycemia can happen for many reasons, but it most often happens to people who do not know they have diabetes or are not managing their diabetes properly.  Whether you have diabetes or not, there are other causes of hyperglycemia. Hyperglycemia can occur when you have diabetes,  but it can also occur in other situations that you might not be as aware of, such as:  Diabetes.   Pre-diabetes. Before people develop Type 2 diabetes, they almost always have "pre-diabetes." This is when your blood glucose levels are higher than normal, but not yet high enough to be diagnosed as diabetes.   Stress. Stress can elevate your blood glucose.   Taking steroids. One side effect can be a rise in blood glucose.  SYMPTOMS  Signs of hyperglycemia include having extreme thirst, dry mouth, blurred vision, needing to urinate often, being overly tired, weak or sleepy, or having tingling in the feet or leg. DIAGNOSIS  Diagnosis is made by monitoring blood glucose. TREATMENT  Knowing the cause of the hyperglycemia is important before the hyperglycemia can be treated. Treatment may include, but is not be limited to:  Education.   Change or adjustment in medications, your meal plan, exercise plan or lifestyle.   Treatment for an illness, infection, etc.   More frequent blood glucose monitoring.   Decreasing or stopping steroids.  HOME CARE INSTRUCTIONS   Test your blood glucose as directed.   Exercise regularly.   Eat wholesome, balanced meals. Eat often and at regular, fixed times.   Being at an ideal weight is important. If needed, losing as little as 10 to 15 pounds may help improve blood glucose levels.  SEEK MEDICAL CARE IF:   You have questions about medicine, activity or diet.   You continue to have symptoms such as increased thirst, urination or weight gain.  SEEK IMMEDIATE MEDICAL CARE IF:   You are vomiting or have diarrhea.   Your breath smells fruity.   You are breathing faster or slower.   You are very sleepy or incoherent.   You have numbness, tingling, or pain in your feet or hands.   You have chest pain.   Your symptoms get worse even though you have been following your caregiver's orders.  Document Released: 01/07/2005 Document Revised: 12/27/2010  Document Reviewed: 08/30/2008 [facility] Patient Information 2012 [place], [place].

## 2011-05-19 NOTE — ED Provider Notes (Signed)
9:07 PM Patient moved to CDU for holding, IVF, nausea medication, symptomatic improvement.  Patient with N/V/D since this morning.  Labs show hyperglycemia 164, other slight abnormalities.  UA shoes ketones, protein, glucose.  Xray without acute process.  Pt reports she is feeling much better.  No nausea at present.  IVF runnning.  Plan is for hydration, then d/c home   11:07 PM Patient has had 2L IVF, is tolerating PO.  No further episodes of diarrhea or emesis.  Plan is for d/c home with zofran, small number of vicodin (requested by patient).  PCP follow up. Patient verbalizes understanding and agrees with plan.    Dillard Cannon [place], [place] 05/19/11 2308

## 2011-05-19 NOTE — ED Notes (Signed)
Placed NS IV infusion of pressure bag since IV was positional and would not infuse with pump.

## 2011-05-31 ENCOUNTER — Other Ambulatory Visit: Payer: Self-pay

## 2011-05-31 NOTE — Telephone Encounter (Signed)
Pt left v/m requesting refill Hydrocodone APAP 7.5 mg to [facility]. Pt last seen 05/17/11 and pt can be reached at [phone].Please advise.

## 2011-06-02 MED ORDER — HYDROCODONE-ACETAMINOPHEN 7.5-500 MG PO TABS: 1.0000 | ORAL_TABLET | Freq: Two times a day (BID) | ORAL | Status: DC | PRN

## 2011-06-02 NOTE — Telephone Encounter (Signed)
Please call in

## 2011-06-03 ENCOUNTER — Other Ambulatory Visit: Payer: Self-pay

## 2011-06-03 NOTE — Telephone Encounter (Signed)
Pt called to ck on refill of hydrocodone apap 7.5/500 mg. Wanted me to ck with [facility] to see if filled. I spoke with Judeth Cornfield at [facility] and was told did receive refill but according to instructions pt cannot pick up until 06/16/11. Patient notified as instructed by telephone. Pt said OK and hung up.

## 2011-06-03 NOTE — Telephone Encounter (Signed)
Medication phoned to pharmacy.  

## 2011-06-05 ENCOUNTER — Telehealth: Payer: Self-pay

## 2011-06-05 NOTE — Telephone Encounter (Signed)
Spoke to representative at [facility] and was informed that there is nothing that the doctor can do regarding this. Patient needs to contact them and provide certain documentation to find out if she qualifies for their help. Unable to reach patient by telephone to advise her of this. Will try again later.

## 2011-06-05 NOTE — Telephone Encounter (Signed)
Please call them and see what they need from [place], if they have a form to send me.  Thanks.

## 2011-06-05 NOTE — Telephone Encounter (Signed)
Spoke to patient by telephone and gave her the information for [facility].

## 2011-06-05 NOTE — Telephone Encounter (Signed)
At 05/17/11 Dr Para March made note for pt to get phone # for subsidized care to help extra services for diagnostics/treatment at lower cost. [facility] care # is [phone].

## 2011-06-05 NOTE — Telephone Encounter (Signed)
Noted  

## 2011-06-12 ENCOUNTER — Ambulatory Visit (INDEPENDENT_AMBULATORY_CARE_PROVIDER_SITE_OTHER): Payer: Self-pay | Admitting: Family Medicine

## 2011-06-12 ENCOUNTER — Encounter: Payer: Self-pay | Admitting: Family Medicine

## 2011-06-12 VITALS — BP 130/80 | HR 69 | Temp 98.1°F | Ht 63.5 in | Wt 184.1 lb

## 2011-06-12 DIAGNOSIS — M549 Dorsalgia, unspecified: Secondary | ICD-10-CM

## 2011-06-12 DIAGNOSIS — F329 Major depressive disorder, single episode, unspecified: Secondary | ICD-10-CM

## 2011-06-12 DIAGNOSIS — F3289 Other specified depressive episodes: Secondary | ICD-10-CM

## 2011-06-12 MED ORDER — DICLOFENAC SODIUM 75 MG PO TBEC: 75.0000 mg | DELAYED_RELEASE_TABLET | Freq: Two times a day (BID) | ORAL | Status: DC

## 2011-06-12 MED ORDER — PREDNISONE 20 MG PO TABS: 20.0000 mg | ORAL_TABLET | Freq: Every day | ORAL | Status: AC

## 2011-06-12 MED ORDER — CYCLOBENZAPRINE HCL 10 MG PO TABS: 10.0000 mg | ORAL_TABLET | Freq: Every evening | ORAL | Status: AC | PRN

## 2011-06-12 MED ORDER — PREDNISONE 20 MG PO TABS: 20.0000 mg | ORAL_TABLET | Freq: Every day | ORAL | Status: DC

## 2011-06-12 NOTE — Patient Instructions (Signed)
Finish your prednisone then take the diclofenac
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
## 2011-07-09 ENCOUNTER — Other Ambulatory Visit: Payer: Self-pay | Admitting: *Deleted

## 2011-07-09 NOTE — Telephone Encounter (Signed)
Faxed refill request.  Med not on meds list, added for refill request.  Please advise.

## 2011-07-09 NOTE — Telephone Encounter (Signed)
Verify with pharmacy when the last rx was picked up and let me know.

## 2011-07-10 NOTE — Telephone Encounter (Signed)
Called and spoke to Hustler at [place] and was advised that patient last got this filled on 03/16/11 #60. Patient has been getting this filled at [facility]. Called and spoke to the pharmacist at [facility] and was advised that Vicodin was called in on 06/03/11, but patient was requesting this early again and patient was not able to pick it up until 06/18/11. Called and advised patient that she can not get this refilled until next Wednesday because it is too early per Dr. Para March. Advised patient that she needs to only use one pharmacy for narcotics. Patient stated that she is switching back to [facility] because they are cheaper. Advised patient that she can no longer switch pharmacies and any infraction could result in no narcotics or possible dismissal from the practice per Dr. Para March. Pharmacy notified that rx is denied because it is too early for the refill.

## 2011-07-10 NOTE — Telephone Encounter (Signed)
Agreed.  Thanks.  

## 2011-07-18 ENCOUNTER — Other Ambulatory Visit: Payer: Self-pay | Admitting: Family Medicine

## 2011-07-19 NOTE — Telephone Encounter (Signed)
Electronic refill request.  Please advise. 

## 2011-07-21 NOTE — Telephone Encounter (Signed)
Please call in

## 2011-07-22 NOTE — Telephone Encounter (Signed)
Medication phoned to pharmacy.  

## 2011-07-23 ENCOUNTER — Other Ambulatory Visit: Payer: Self-pay | Admitting: *Deleted

## 2011-07-23 MED ORDER — QUINAPRIL HCL 20 MG PO TABS: 20.0000 mg | ORAL_TABLET | Freq: Every day | ORAL | Status: DC

## 2011-08-06 ENCOUNTER — Ambulatory Visit (INDEPENDENT_AMBULATORY_CARE_PROVIDER_SITE_OTHER): Payer: Self-pay | Admitting: Family Medicine

## 2011-08-06 ENCOUNTER — Encounter: Payer: Self-pay | Admitting: Family Medicine

## 2011-08-06 ENCOUNTER — Ambulatory Visit (INDEPENDENT_AMBULATORY_CARE_PROVIDER_SITE_OTHER)
Admission: RE | Admit: 2011-08-06 | Discharge: 2011-08-06 | Disposition: A | Discharge status: Home / Self Care | Payer: Self-pay | Source: Ambulatory Visit | Attending: Family Medicine | Admitting: Family Medicine

## 2011-08-06 VITALS — BP 154/94 | HR 92 | Temp 98.2°F | Wt 194.0 lb

## 2011-08-06 DIAGNOSIS — M549 Dorsalgia, unspecified: Secondary | ICD-10-CM

## 2011-08-06 NOTE — Patient Instructions (Addendum)
Bring a list of all of your meds to every visit.  Let us know what medicines you are taking.  We'll contact you with your xray report. I can't change or refill your meds today.

## 2011-08-06 NOTE — Progress Notes (Signed)
Patient states she is on a medication that her psychiatrist prescribed for bipolar but she doesn't know the name. Beth Parker, CMA  [patient cal ed about this- doxepin added to med list, 150mg  qhs.]  Back pain, bending, walking.  She called about charity care and is awaiting a form from [facility].  Same pain as prev but a worse degree. Just above the waistline and to either side.  Occ radicular pain, to R side. No foot drop.  Sensation in foot intact.  Gait is affected by pain but not by true weakness.  She can tie her shoes, but "very carefully."  No more wrecks.  She had fallen, tripped recently, "walking through the junk in the house."  We talked about clearing her path.    She is out of vicodin.  Ran out yesterday. Rx was to last through 08/16/11. See plan.   Meds, vitals, and allergies reviewed.   ROS: See HPI.  Otherwise, noncontributory.  Uncomfortable but NAD rrr ctab Back w/o midline pain except in L spine.  B paraspinal muscle tenderness in L spine.   Symmetric gait, 3 half steps to turn around.  No foot drop. No weakness in legs.

## 2011-08-07 NOTE — Assessment & Plan Note (Signed)
She misused her vicodin by using it up early, not notifying me, and then asking for refill today.  This is a dismisable event.  I explained to patient that she had to follow medical recs re: her pain meds or I would dismiss her.  I am trying to treat her in good faith. She does have old L 1 fx.  See notes on repeat imaging. No acute findings today.

## 2011-08-22 ENCOUNTER — Other Ambulatory Visit: Payer: Self-pay | Admitting: *Deleted

## 2011-08-22 MED ORDER — HYDROCODONE-ACETAMINOPHEN 7.5-500 MG PO TABS: ORAL_TABLET | ORAL | Status: DC

## 2011-08-22 NOTE — Telephone Encounter (Signed)
Faxed refill request from [facility] [address], last filled 07/22/11.

## 2011-08-22 NOTE — Telephone Encounter (Signed)
Please call in

## 2011-08-22 NOTE — Telephone Encounter (Signed)
Medication phoned to pharmacy.  

## 2011-09-19 ENCOUNTER — Other Ambulatory Visit: Payer: Self-pay | Admitting: Family Medicine

## 2011-09-20 NOTE — Telephone Encounter (Signed)
Electronic refill request

## 2011-09-23 NOTE — Telephone Encounter (Signed)
Please call in

## 2011-09-24 NOTE — Telephone Encounter (Signed)
Rx called to pharmacy as instructed. 

## 2011-10-01 ENCOUNTER — Other Ambulatory Visit: Payer: Self-pay | Admitting: Family Medicine

## 2011-10-01 DIAGNOSIS — E7800 Pure hypercholesterolemia, unspecified: Secondary | ICD-10-CM

## 2011-10-08 ENCOUNTER — Other Ambulatory Visit (INDEPENDENT_AMBULATORY_CARE_PROVIDER_SITE_OTHER): Payer: Self-pay

## 2011-10-08 DIAGNOSIS — E7800 Pure hypercholesterolemia, unspecified: Secondary | ICD-10-CM

## 2011-10-08 LAB — COMPREHENSIVE METABOLIC PANEL
ALT: 30 U/L (ref 0–35)
Albumin: 4 g/dL (ref 3.5–5.2)
CO2: 23 mEq/L (ref 19–32)
Calcium: 8.6 mg/dL (ref 8.4–10.5)
Chloride: 101 mEq/L (ref 96–112)
GFR: 46.94 mL/min — ABNORMAL LOW (ref >60.00)
Sodium: 138 mEq/L (ref 135–145)
Total Protein: 7.3 g/dL (ref 6.0–8.3)

## 2011-10-08 LAB — LIPID PANEL
Cholesterol: 157 mg/dL (ref 0–200)
Total CHOL/HDL Ratio: 5

## 2011-10-11 ENCOUNTER — Other Ambulatory Visit: Payer: Self-pay | Admitting: *Deleted

## 2011-10-11 MED ORDER — PANTOPRAZOLE SODIUM 40 MG PO TBEC: 40.0000 mg | DELAYED_RELEASE_TABLET | Freq: Two times a day (BID) | ORAL | Status: DC

## 2011-10-11 MED ORDER — ATORVASTATIN CALCIUM 40 MG PO TABS: 40.0000 mg | ORAL_TABLET | Freq: Every day | ORAL | Status: DC

## 2011-10-15 ENCOUNTER — Other Ambulatory Visit: Payer: Self-pay | Admitting: *Deleted

## 2011-10-15 ENCOUNTER — Encounter: Payer: Self-pay | Admitting: Family Medicine

## 2011-10-15 ENCOUNTER — Ambulatory Visit (INDEPENDENT_AMBULATORY_CARE_PROVIDER_SITE_OTHER): Payer: Self-pay | Admitting: Family Medicine

## 2011-10-15 VITALS — BP 140/90 | HR 89 | Temp 98.3°F | Ht 65.0 in | Wt 191.0 lb

## 2011-10-15 DIAGNOSIS — Z1211 Encounter for screening for malignant neoplasm of colon: Secondary | ICD-10-CM

## 2011-10-15 DIAGNOSIS — Z23 Encounter for immunization: Secondary | ICD-10-CM

## 2011-10-15 DIAGNOSIS — M549 Dorsalgia, unspecified: Secondary | ICD-10-CM

## 2011-10-15 DIAGNOSIS — R739 Hyperglycemia, unspecified: Secondary | ICD-10-CM

## 2011-10-15 DIAGNOSIS — R7309 Other abnormal glucose: Secondary | ICD-10-CM

## 2011-10-15 DIAGNOSIS — F329 Major depressive disorder, single episode, unspecified: Secondary | ICD-10-CM

## 2011-10-15 DIAGNOSIS — I10 Essential (primary) hypertension: Secondary | ICD-10-CM

## 2011-10-15 DIAGNOSIS — E7800 Pure hypercholesterolemia, unspecified: Secondary | ICD-10-CM

## 2011-10-15 DIAGNOSIS — Z0000 Encounter for general adult medical examination without abnormal findings: Secondary | ICD-10-CM

## 2011-10-15 MED ORDER — DICLOFENAC SODIUM 75 MG PO TBEC: 75.0000 mg | DELAYED_RELEASE_TABLET | Freq: Two times a day (BID) | ORAL | Status: AC

## 2011-10-15 MED ORDER — QUINAPRIL HCL 20 MG PO TABS: 20.0000 mg | ORAL_TABLET | Freq: Every day | ORAL | Status: DC

## 2011-10-15 MED ORDER — METOPROLOL SUCCINATE ER 100 MG PO TB24: 100.0000 mg | ORAL_TABLET | Freq: Every day | ORAL | Status: DC

## 2011-10-15 MED ORDER — HYDROCODONE-ACETAMINOPHEN 7.5-500 MG PO TABS: ORAL_TABLET | ORAL | Status: DC

## 2011-10-15 NOTE — Patient Instructions (Addendum)
Call about your mammogram.   Go to the lab on the way out to get the stool cards.  We'll contact you with your lab report. Take care.  I want to see you back in 6 months to recheck your blood pressure.   Call the social security office about your disability papers.  Take care.

## 2011-10-15 NOTE — Progress Notes (Signed)
CPE- See plan.  Routine anticipatory guidance given to patient.  See health maintenance. Tetanus 2008 Flu shot done today.   S/p hysterectomy.  No indication for pap.  D/w patient [id number] for colon cancer screening, including IFOB vs. colonoscopy.  Risks and benefits of both were discussed and patient voiced understanding.  Pt elects for: IFOB.   Due for mammogram.  She has a card and will call about her appointment for mammogram.  No lumps, masses, nipple discharge per patient.   Advance directive d/w pt.  Married.  She would want her husband designated if she were incapacitated.  Encouraged to discuss with husband.    She has f/u with psych today.  She didn't tolerate fluphenazine due to a tremor and stopped it.    Hypertension:    Using medication without problems or lightheadedness: yes Chest pain with exertion:no Edema:no Short of breath:no  Elevated Cholesterol: Using medications without problems:yes Muscle aches: yes, at baseline and not likely from statin Diet compliance: 'fair' Exercise: limited by pain  Was asking about disability forms.  I referred her back to [employer] office.    Back pain continues along with knees and shoulders.  At baseline.  No ADE, no constipation or sedation.  Inc pain walking, using a cane to compensate with some relief.    PMH and SH reviewed  Meds, vitals, and allergies reviewed.   ROS: See HPI.  Otherwise negative.    GEN: nad, alert and oriented HEENT: mucous membranes moist NECK: supple w/o LA CV: rrr. PULM: ctab, no inc wob ABD: soft, +bs EXT: no edema SKIN: no acute rash

## 2011-10-16 DIAGNOSIS — R739 Hyperglycemia, unspecified: Secondary | ICD-10-CM | POA: Insufficient documentation

## 2011-10-16 DIAGNOSIS — R7301 Impaired fasting glucose: Secondary | ICD-10-CM | POA: Insufficient documentation

## 2011-10-16 DIAGNOSIS — Z0000 Encounter for general adult medical examination without abnormal findings: Secondary | ICD-10-CM | POA: Insufficient documentation

## 2011-10-16 NOTE — Assessment & Plan Note (Signed)
Needs low carb diet, needs to lose weight gradually.  D/w pt.

## 2011-10-16 NOTE — Assessment & Plan Note (Addendum)
D/w pt.  Continue current meds.  I would not inc her opiates at this point.  She needs to work on weight to dec the strain/loading on joints.  Advised again to sell motorcycle and find other transport. She was asking about disability forms. I don't do disability exams.  I referred her back to [employer] office.

## 2011-10-16 NOTE — Assessment & Plan Note (Signed)
Controlled, needs to lose weight, continue current meds.

## 2011-10-16 NOTE — Assessment & Plan Note (Signed)
Routine anticipatory guidance given to patient.  See health maintenance. Tetanus 2008 Flu shot 2013 S/p hysterectomy.  No indication for pap.  D/w patient [id number] for colon cancer screening, including IFOB vs. colonoscopy.  Risks and benefits of both were discussed and patient voiced understanding.  Pt elects for: IFOB.   Due for mammogram.  She has a card and will call about her appointment for mammogram.  No lumps, masses, nipple discharge per patient.   Advance directive d/w pt.  Married.  She would want her husband designated if she were incapacitated.  Encouraged to discuss with husband.

## 2011-10-16 NOTE — Assessment & Plan Note (Signed)
Per psych 

## 2011-10-16 NOTE — Assessment & Plan Note (Signed)
Controlled, needs to lose weight, continue current meds.  Labs d/w pt.

## 2011-11-12 ENCOUNTER — Other Ambulatory Visit: Payer: Self-pay | Admitting: *Deleted

## 2011-11-12 NOTE — Telephone Encounter (Signed)
Faxed refill request.  Last filled 10/17/11.

## 2011-11-13 MED ORDER — HYDROCODONE-ACETAMINOPHEN 7.5-500 MG PO TABS: ORAL_TABLET | ORAL | Status: DC

## 2011-11-13 NOTE — Telephone Encounter (Signed)
Pt request status of hydrocodone refill. Advised pt called in to [facility].

## 2011-11-13 NOTE — Telephone Encounter (Signed)
rx called into pharmacy

## 2011-11-13 NOTE — Telephone Encounter (Signed)
Please call in

## 2011-11-14 ENCOUNTER — Other Ambulatory Visit: Payer: Self-pay | Admitting: *Deleted

## 2012-03-21 ENCOUNTER — Emergency Department (HOSPITAL_COMMUNITY): Payer: Self-pay

## 2012-03-21 ENCOUNTER — Encounter (HOSPITAL_COMMUNITY): Payer: Self-pay | Admitting: Adult Health

## 2012-03-21 ENCOUNTER — Emergency Department (HOSPITAL_COMMUNITY)
Admission: EM | Admit: 2012-03-21 | Discharge: 2012-03-22 | Disposition: A | Discharge status: Home / Self Care | Payer: Self-pay | Attending: Emergency Medicine | Admitting: Emergency Medicine

## 2012-03-21 DIAGNOSIS — Z79899 Other long term (current) drug therapy: Secondary | ICD-10-CM | POA: Insufficient documentation

## 2012-03-21 DIAGNOSIS — Z8679 Personal history of other diseases of the circulatory system: Secondary | ICD-10-CM | POA: Insufficient documentation

## 2012-03-21 DIAGNOSIS — R197 Diarrhea, unspecified: Secondary | ICD-10-CM | POA: Insufficient documentation

## 2012-03-21 DIAGNOSIS — E7800 Pure hypercholesterolemia, unspecified: Secondary | ICD-10-CM | POA: Insufficient documentation

## 2012-03-21 DIAGNOSIS — R61 Generalized hyperhidrosis: Secondary | ICD-10-CM | POA: Insufficient documentation

## 2012-03-21 DIAGNOSIS — Z8739 Personal history of other diseases of the musculoskeletal system and connective tissue: Secondary | ICD-10-CM | POA: Insufficient documentation

## 2012-03-21 DIAGNOSIS — R112 Nausea with vomiting, unspecified: Secondary | ICD-10-CM | POA: Insufficient documentation

## 2012-03-21 DIAGNOSIS — Z87828 Personal history of other (healed) physical injury and trauma: Secondary | ICD-10-CM | POA: Insufficient documentation

## 2012-03-21 DIAGNOSIS — F411 Generalized anxiety disorder: Secondary | ICD-10-CM | POA: Insufficient documentation

## 2012-03-21 DIAGNOSIS — I10 Essential (primary) hypertension: Secondary | ICD-10-CM | POA: Insufficient documentation

## 2012-03-21 DIAGNOSIS — K219 Gastro-esophageal reflux disease without esophagitis: Secondary | ICD-10-CM | POA: Insufficient documentation

## 2012-03-21 DIAGNOSIS — Z9889 Other specified postprocedural states: Secondary | ICD-10-CM | POA: Insufficient documentation

## 2012-03-21 DIAGNOSIS — F3289 Other specified depressive episodes: Secondary | ICD-10-CM | POA: Insufficient documentation

## 2012-03-21 DIAGNOSIS — Z87891 Personal history of nicotine dependence: Secondary | ICD-10-CM | POA: Insufficient documentation

## 2012-03-21 DIAGNOSIS — Z90710 Acquired absence of both cervix and uterus: Secondary | ICD-10-CM | POA: Insufficient documentation

## 2012-03-21 LAB — COMPREHENSIVE METABOLIC PANEL
Albumin: 4.6 g/dL (ref 3.5–5.2)
BUN: 14 mg/dL (ref 6–23)
CO2: 24 mEq/L (ref 19–32)
Chloride: 103 mEq/L (ref 96–112)
Creatinine, Ser: 1.13 mg/dL — ABNORMAL HIGH (ref 0.50–1.10)
GFR calc Af Amer: 62 mL/min — ABNORMAL LOW (ref >90)
GFR calc non Af Amer: 54 mL/min — ABNORMAL LOW (ref >90)
Glucose, Bld: 157 mg/dL — ABNORMAL HIGH (ref 70–99)
Total Bilirubin: 0.8 mg/dL (ref 0.3–1.2)

## 2012-03-21 LAB — LIPASE, BLOOD: Lipase: 18 U/L (ref 11–59)

## 2012-03-21 LAB — CBC WITH DIFFERENTIAL/PLATELET
Basophils Relative: 0 % (ref 0–1)
HCT: 37.4 % (ref 36.0–46.0)
Hemoglobin: 13.3 g/dL (ref 12.0–15.0)
MCHC: 35.6 g/dL (ref 30.0–36.0)
Monocytes Absolute: 0.3 10*3/uL (ref 0.1–1.0)
Monocytes Relative: 6 % (ref 3–12)
Neutro Abs: 3.9 10*3/uL (ref 1.7–7.7)

## 2012-03-21 LAB — POCT I-STAT TROPONIN I: Troponin i, poc: 0 ng/mL (ref 0.00–0.08)

## 2012-03-21 MED ORDER — METOCLOPRAMIDE HCL 5 MG/ML IJ SOLN
10.0000 mg | Freq: Once | INTRAMUSCULAR | Status: AC
  Administered 2012-03-21: 10 mg via INTRAVENOUS
  Filled 2012-03-21: qty 2

## 2012-03-21 MED ORDER — SODIUM CHLORIDE 0.9 % IV BOLUS (SEPSIS)
1000.0000 mL | Freq: Once | INTRAVENOUS | Status: AC
  Administered 2012-03-21: 1000 mL via INTRAVENOUS

## 2012-03-21 MED ORDER — ONDANSETRON HCL 4 MG/2ML IJ SOLN
4.0000 mg | Freq: Once | INTRAMUSCULAR | Status: AC
  Administered 2012-03-21: 4 mg via INTRAVENOUS
  Filled 2012-03-21: qty 2

## 2012-03-21 MED ORDER — MORPHINE SULFATE 4 MG/ML IJ SOLN
4.0000 mg | Freq: Once | INTRAMUSCULAR | Status: AC
  Administered 2012-03-21: 4 mg via INTRAVENOUS
  Filled 2012-03-21: qty 1

## 2012-03-21 MED ORDER — ONDANSETRON 4 MG PO TBDP
ORAL_TABLET | ORAL | Status: AC
  Filled 2012-03-21: qty 2

## 2012-03-21 MED ORDER — ONDANSETRON 4 MG PO TBDP
8.0000 mg | ORAL_TABLET | Freq: Once | ORAL | Status: AC
  Administered 2012-03-21: 8 mg via ORAL

## 2012-03-21 NOTE — ED Notes (Signed)
Pt. Reports N/V and diarrhea since 1200. Pt. States diarrhea is dark/black, denies vomiting blood. C/o lower abdominal pain.

## 2012-03-21 NOTE — ED Notes (Addendum)
Presents with lower quadrant abdominal pain that began today at lunch associated with diaphoresis, nausea, vomiting and dark loose stools. Pt is actively vomiting and clammy. Hypertensive 212/116. Alert, oriented, denies headache, denies dizziness. Pain described as cramping and constant. Denies urinary symptoms and vaginal discharge or bleeding.

## 2012-03-22 LAB — URINALYSIS, MICROSCOPIC ONLY
Ketones, ur: 40 mg/dL — AB
Leukocytes, UA: NEGATIVE
Nitrite: NEGATIVE
Protein, ur: 30 mg/dL — AB
Urobilinogen, UA: 0.2 mg/dL (ref 0.0–1.0)

## 2012-03-22 MED ORDER — METOCLOPRAMIDE HCL 10 MG PO TABS: 10.0000 mg | ORAL_TABLET | Freq: Three times a day (TID) | ORAL | Status: DC | PRN

## 2012-03-22 NOTE — ED Provider Notes (Signed)
History     CSN: [id number]  Arrival date & time 03/21/12  1941   First MD Initiated Contact with Patient 03/21/12 2111      Chief Complaint  Patient presents with  . Abdominal Pain    (Consider location/radiation/quality/duration/timing/severity/associated sxs/prior treatment) HPI Comments: 57 y.o. Female presents with diffuse lower quadrant abdominal pain that started at a church breakfast earlier today. Pt rates pain 10/10. Admits nausea, vomiting, diaphoresis and few episodes of dark, loose stools. Denies fever, headache, dizziness, chest pain, difficulty breathing, hematochezia, difficulty urinating, dysuria, hematuria, vaginal discharge, or vaginal bleeding. Describes pain as crampy and constant. Nothing makes it better or worse. Pt tried no interventions.   PMHx significant for gastroenteritis and multiple abdominal surgeries (c-section, tubal ligation, hysterectomy)  Patient is a 57 y.o. female presenting with abdominal pain.  Abdominal Pain Associated symptoms: diarrhea, nausea and vomiting   Associated symptoms: no chest pain, no constipation, no dysuria, no fever, no hematuria, no shortness of breath, no sore throat, no vaginal bleeding and no vaginal discharge     Past Medical History  Diagnosis Date  . Rib fracture     R 5th, 01/2011  . Migraine   . Depression   . Anxiety   . Arthritis     knee and back, h/o chronic L1 fracture  . GERD (gastroesophageal reflux disease)   . Hypertension   . High cholesterol   . Back pain     h/o L1 fracture    Past Surgical History  Procedure Laterality Date  . Hemorrhoid surgery      internal and external  . Cervical disc surgery  1994  . Wrist surgery      right (ganglion cyst)  . Ankle surgery  2005  . Cesarean section    . Breast biopsy  08-23-2010    right (benign)  . Tubal ligation    . Abdominal hysterectomy  1986    Right ovary remains    Family History  Problem Relation Age of Onset  . Hypertension Mother    . Alzheimer's disease Mother   . Cancer Father     prostate  . Mental illness Father   . Mental illness Brother   . Mental illness Sister   . Breast cancer Sister   . Cancer Sister     breast cancer  . Colon cancer Neg Hx     History  Substance Use Topics  . Smoking status: Former Games developer  . Smokeless tobacco: Never Used     Comment: quit over 8 years ago  . Alcohol Use: No    OB History   Grav Para Term Preterm Abortions TAB SAB Ect Mult Living                  Review of Systems  Constitutional: Positive for diaphoresis. Negative for fever.  HENT: Negative for sore throat, rhinorrhea, neck pain and neck stiffness.   Eyes: Negative for visual disturbance.  Respiratory: Negative for apnea, chest tightness and shortness of breath.   Cardiovascular: Negative for chest pain and palpitations.  Gastrointestinal: Positive for nausea, vomiting, abdominal pain and diarrhea. Negative for constipation and blood in stool.  Genitourinary: Negative for dysuria, hematuria, flank pain, vaginal bleeding, vaginal discharge and pelvic pain.  Musculoskeletal: Negative for gait problem.  Skin: Negative for rash.  Neurological: Negative for dizziness, weakness, light-headedness, numbness and headaches.    Allergies  Fluphenazine  Home Medications   Current Outpatient Rx  Name  Route  Sig  Dispense  Refill  . ALPRAZolam (XANAX) 1 MG tablet   Oral   Take 1 mg by mouth 3 (three) times daily.           Marland Kitchen atorvastatin (LIPITOR) 40 MG tablet   Oral   Take 1 tablet (40 mg total) by mouth daily.   90 tablet   3   . diclofenac (VOLTAREN) 75 MG EC tablet   Oral   Take 1 tablet (75 mg total) by mouth 2 (two) times daily.   60 tablet   5   . HYDROcodone-acetaminophen (LORTAB) 7.5-500 MG per tablet   Oral   Take 1 tablet by mouth 2 (two) times daily as needed for pain.         Marland Kitchen lurasidone (LATUDA) 40 MG TABS   Oral   Take 40 mg by mouth daily with breakfast.         .  metoprolol succinate (TOPROL-XL) 100 MG 24 hr tablet   Oral   Take 1 tablet (100 mg total) by mouth daily. Take with or immediately following a meal.   90 tablet   3   . pantoprazole (PROTONIX) 40 MG tablet   Oral   Take 1 tablet (40 mg total) by mouth 2 (two) times daily.   180 tablet   3   . quinapril (ACCUPRIL) 20 MG tablet   Oral   Take 1 tablet (20 mg total) by mouth daily.   90 tablet   3   . metoCLOPramide (REGLAN) 10 MG tablet   Oral   Take 1 tablet (10 mg total) by mouth 3 (three) times daily as needed (for nausea).   20 tablet   0     BP 139/78  Pulse 103  Temp(Src) 98.6 F (37 C) (Oral)  Resp 18  SpO2 97%  Physical Exam  Nursing note and vitals reviewed. Constitutional: She is oriented to person, place, and time. She appears well-developed and well-nourished. No distress.  HENT:  Head: Normocephalic and atraumatic.  Eyes: Conjunctivae and EOM are normal.  Neck: Normal range of motion. Neck supple.  No meningeal signs  Cardiovascular: Normal rate, regular rhythm, normal heart sounds and intact distal pulses.  Exam reveals no gallop and no friction rub.   No murmur heard. Pulmonary/Chest: Effort normal and breath sounds normal. No respiratory distress. She has no wheezes. She has no rales. She exhibits no tenderness.  Abdominal: Soft. Bowel sounds are normal. She exhibits no distension. There is tenderness. There is no rebound and no guarding.  Diffuse lower quadrant tenderness. No pain at McBurney's point. No Murphy sign. No ecchymosis.   Musculoskeletal: Normal range of motion. She exhibits no edema and no tenderness.  Neurological: She is alert and oriented to person, place, and time. No cranial nerve deficit.  Skin: Skin is warm and dry. She is not diaphoretic. No erythema.    ED Course  Procedures (including critical care time)  Labs Reviewed  CBC WITH DIFFERENTIAL - Abnormal; Notable for the following:    Neutrophils Relative 78 (*)    All other  components within normal limits  COMPREHENSIVE METABOLIC PANEL - Abnormal; Notable for the following:    Glucose, Bld 157 (*)    Creatinine, Ser 1.13 (*)    Total Protein 8.4 (*)    Alkaline Phosphatase 145 (*)    GFR calc non Af Amer 54 (*)    GFR calc Af Amer 62 (*)    All other components within normal limits  URINALYSIS, MICROSCOPIC ONLY - Abnormal; Notable for the following:    APPearance CLOUDY (*)    Ketones, ur 40 (*)    Protein, ur 30 (*)    Bacteria, UA FEW (*)    Squamous Epithelial / LPF FEW (*)    All other components within normal limits  LIPASE, BLOOD  POCT I-STAT TROPONIN I   Dg Abd Acute W/chest  03/21/2012  *RADIOLOGY REPORT*  Clinical Data: Diffuse abdominal pain  ACUTE ABDOMEN SERIES (ABDOMEN 2 VIEW & CHEST 1 VIEW)  Comparison: 05/19/2011  Findings: Lungs are clear.  Deformity of the distal right clavicle is again noted.  Cardiomediastinal contours within normal range.  No free intraperitoneal air. The bowel gas pattern is nonspecific, with a relative paucity of small bowel gas.  Air within normal caliber colon, therefore no evidence for complete obstruction. Organ outlines are normal where seen. No acute or aggressive osseous abnormality identified.  IMPRESSION: Nonspecific however nonobstructive bowel gas pattern.   Original Report Authenticated By: Jearld Lesch, M.D.     Date: 03/21/2012  Rate: 82  Rhythm: normal sinus rhythm  QRS Axis: normal  Intervals: normal  ST/T Wave abnormalities: normal  Conduction Disutrbances: none  Narrative Interpretation: Normal EKG  Old EKG Reviewed: None available   1. Emesis       MDM  Nausea and pain significant and difficult to control. Hypertensive to 200s/100s. IV Zofran and one dose of Morphine ineffective. Will try reglan and second dose of morphine with bolus. Look at labs, imaging, urinalysis and re-assess.   Pain and nausea under control. HTN resolved to 140s/80s.EKG shows normal sinus rhythm and troponin  negative. Labs and imaging unremarkable with the exception of increasing Alk Phos of 145 that was discussed with pt by Dr. Rubin Payor. Patient is nontoxic, nonseptic appearing, in no apparent distress.  Patient does not meet the SIRS or Sepsis criteria.    On repeat exam patient does not have a surgical abdomen and there are no peritoneal signs.  No indication of appendicitis, bowel obstruction, bowel perforation, cholecystitis, diverticulitis.  Patient discharged home with symptomatic treatment and given strict instructions for follow-up with their primary care physician.  I have also discussed reasons to return immediately to the ER.  Patient expresses understanding and agrees with plan.     Glade Nurse, PA-C 03/22/12 1252

## 2012-03-23 NOTE — ED Provider Notes (Signed)
Medical screening examination/treatment/procedure(s) were conducted as a shared visit with non-physician practitioner(s) and myself.  I personally evaluated the patient during the encounter. Patient resides with abdominal pain and nausea. Improved after treatment. Laboratory reassuring. Also had hypertension is improved with pain control. Will be discharged home  Juliet Rude. Rubin Payor, MD 03/23/12 [phone]

## 2012-04-24 DIAGNOSIS — S069XAA Unspecified intracranial injury with loss of consciousness status unknown, initial encounter: Secondary | ICD-10-CM | POA: Insufficient documentation

## 2012-05-06 ENCOUNTER — Other Ambulatory Visit: Payer: Self-pay | Admitting: Family Medicine

## 2012-05-06 MED ORDER — PANTOPRAZOLE SODIUM 40 MG PO TBEC: 40.0000 mg | DELAYED_RELEASE_TABLET | Freq: Two times a day (BID) | ORAL | Status: DC
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
## 2012-10-07 DIAGNOSIS — H2510 Age-related nuclear cataract, unspecified eye: Secondary | ICD-10-CM | POA: Insufficient documentation

## 2012-12-05 ENCOUNTER — Emergency Department: Payer: Self-pay | Admitting: Emergency Medicine

## 2012-12-05 LAB — BASIC METABOLIC PANEL
Anion Gap: 4 — ABNORMAL LOW (ref 7–16)
Anion Gap: 6 — ABNORMAL LOW (ref 7–16)
BUN: 11 mg/dL (ref 7–18)
BUN: 9 mg/dL (ref 7–18)
Calcium, Total: 8.8 mg/dL (ref 8.5–10.1)
Chloride: 110 mmol/L — ABNORMAL HIGH (ref 98–107)
Chloride: 115 mmol/L — ABNORMAL HIGH (ref 98–107)
EGFR (African American): >60
EGFR (Non-African Amer.): >60
EGFR (Non-African Amer.): >60
Osmolality: 275 (ref 275–301)

## 2012-12-05 LAB — TROPONIN I
Troponin-I: <0.02 ng/mL
Troponin-I: <0.02 ng/mL

## 2012-12-05 LAB — CBC
MCH: 30.7 pg (ref 26.0–34.0)
MCHC: 33.7 g/dL (ref 32.0–36.0)
MCV: 91 fL (ref 80–100)
WBC: 6 10*3/uL (ref 3.6–11.0)

## 2012-12-05 LAB — CK TOTAL AND CKMB (NOT AT ARMC): CK, Total: 117 U/L (ref 21–215)

## 2013-03-23 ENCOUNTER — Emergency Department: Payer: Self-pay | Admitting: Emergency Medicine

## 2013-08-10 ENCOUNTER — Emergency Department: Payer: Self-pay | Admitting: Emergency Medicine

## 2013-10-06 ENCOUNTER — Ambulatory Visit: Payer: Self-pay | Admitting: Anesthesiology

## 2014-04-04 ENCOUNTER — Ambulatory Visit: Payer: Self-pay | Admitting: Family Medicine

## 2014-05-06 ENCOUNTER — Ambulatory Visit
Admit: 2014-05-06 | Disposition: A | Discharge status: Home / Self Care | Payer: Self-pay | Attending: Family Medicine | Admitting: Family Medicine

## 2014-05-30 ENCOUNTER — Ambulatory Visit: Payer: Medicaid Other | Admitting: Certified Registered"

## 2014-05-30 ENCOUNTER — Ambulatory Visit
Admission: RE | Admit: 2014-05-30 | Discharge: 2014-05-30 | Disposition: A | Discharge status: Home / Self Care | Payer: Medicaid Other | Source: Ambulatory Visit | Attending: Gastroenterology | Admitting: Gastroenterology

## 2014-05-30 ENCOUNTER — Encounter
Admission: RE | Disposition: A | Discharge status: Home / Self Care | Payer: Self-pay | Source: Ambulatory Visit | Attending: Gastroenterology

## 2014-05-30 ENCOUNTER — Encounter: Payer: Self-pay | Admitting: Gastroenterology

## 2014-05-30 DIAGNOSIS — Z79899 Other long term (current) drug therapy: Secondary | ICD-10-CM | POA: Diagnosis not present

## 2014-05-30 DIAGNOSIS — Z86010 Personal history of colonic polyps: Secondary | ICD-10-CM

## 2014-05-30 DIAGNOSIS — Z1211 Encounter for screening for malignant neoplasm of colon: Secondary | ICD-10-CM | POA: Diagnosis not present

## 2014-05-30 DIAGNOSIS — K5730 Diverticulosis of large intestine without perforation or abscess without bleeding: Secondary | ICD-10-CM | POA: Diagnosis not present

## 2014-05-30 DIAGNOSIS — Z800 Family history of malignant neoplasm of digestive organs: Secondary | ICD-10-CM | POA: Insufficient documentation

## 2014-05-30 DIAGNOSIS — K621 Rectal polyp: Secondary | ICD-10-CM | POA: Diagnosis not present

## 2014-05-30 DIAGNOSIS — K210 Gastro-esophageal reflux disease with esophagitis: Secondary | ICD-10-CM | POA: Insufficient documentation

## 2014-05-30 HISTORY — PX: COLONOSCOPY: SHX5424

## 2014-05-30 HISTORY — PX: ESOPHAGOGASTRODUODENOSCOPY: SHX5428

## 2014-05-30 SURGERY — COLONOSCOPY
Anesthesia: General

## 2014-05-30 MED ORDER — SODIUM CHLORIDE 0.9 % IV SOLN
INTRAVENOUS | Status: DC
  Administered 2014-05-30: 1000 mL via INTRAVENOUS

## 2014-05-30 MED ORDER — PROPOFOL INFUSION 10 MG/ML OPTIME
INTRAVENOUS | Status: DC | PRN
Start: 2014-05-30 — End: 2014-05-30
  Administered 2014-05-30: 120 ug/kg/min via INTRAVENOUS

## 2014-05-30 MED ORDER — PROPOFOL 10 MG/ML IV BOLUS
INTRAVENOUS | Status: DC | PRN
  Administered 2014-05-30: 50 mg via INTRAVENOUS

## 2014-05-30 MED ORDER — SODIUM CHLORIDE 0.9 % IV SOLN
INTRAVENOUS | Status: DC | PRN
  Administered 2014-05-30: 11:00:00 via INTRAVENOUS

## 2014-05-30 MED ORDER — LIDOCAINE HCL (CARDIAC) 20 MG/ML IV SOLN
INTRAVENOUS | Status: DC | PRN
  Administered 2014-05-30: 60 mg via INTRAVENOUS

## 2014-05-30 MED ORDER — MIDAZOLAM HCL 2 MG/2ML IJ SOLN
INTRAMUSCULAR | Status: DC | PRN
  Administered 2014-05-30: 2 mg via INTRAVENOUS

## 2014-05-30 NOTE — H&P (Signed)
  Date of Initial H&P: 05/17/2014  History reviewed, patient examined, no change in status, stable for surgery. 

## 2014-05-30 NOTE — Op Note (Signed)
[facility] Patient Name: Beth HartshornKaren Covin Procedure Date: 05/30/2014 10:21 AM MRN: [id number] Account #: [id number] Date of Birth: [birth year] Admit Type: Outpatient Age: [id number] Room: [facility] 4 Gender: Female Note Status: Finalized Procedure:         Colonoscopy Indications:       Screening in patient at increased risk: Family history of                     1st-degree relative with colorectal cancer Providers:         Ezzard StandingPaul Y. Bluford Kaufmannh, MD Medicines:         Monitored Anesthesia Care Complications:     No immediate complications. Procedure:         Pre-Anesthesia Assessment:                    - Prior to the procedure, a History and Physical was                     performed, and patient medications, allergies and                     sensitivities were reviewed. The patient's tolerance of                     previous anesthesia was reviewed.                    - The risks and benefits of the procedure and the sedation                     options and risks were discussed with the patient. All                     questions were answered and informed consent was obtained.                    - After reviewing the risks and benefits, the patient was                     deemed in satisfactory condition to undergo the procedure.                    After obtaining informed consent, the colonoscope was                     passed under direct vision. Throughout the procedure, the                     patient's blood pressure, pulse, and oxygen saturations                     were monitored continuously. The Colonoscope was                     introduced through the anus and advanced to the the cecum,                     identified by appendiceal orifice and ileocecal valve. The                     colonoscopy was performed without difficulty. The patient                     tolerated the procedure well. The  quality of the bowel                     preparation was  fair. Findings:      Multiple small-mouthed diverticula were found in the sigmoid colon and       in the descending colon.      A medium polyp was found in the rectum. The polyp was semi-pedunculated.       The polyp was removed with a hot snare. Resection and retrieval were       complete.      The exam was otherwise without abnormality. Impression:        - Diverticulosis in the sigmoid colon and in the                     descending colon.                    - One medium polyp in the rectum. Resected and retrieved.                    - The examination was otherwise normal. Recommendation:    - Discharge patient to home.                    - Await pathology results.                    - Repeat colonoscopy in 3 - 5 years for surveillance based                     on pathology results.                    - The findings and recommendations were discussed with the                     patient. Wallace CullensPaul Y Stanley Helmuth, MD 05/30/2014 11:46:22 AM This report has been signed electronically. Number of Addenda: 0 Note Initiated On: 05/30/2014 10:21 AM Scope Withdrawal Time: 0 hours 8 minutes 28 seconds  Total Procedure Duration: 0 hours 10 minutes 56 seconds       [facility]

## 2014-05-30 NOTE — Transfer of Care (Signed)
Immediate Anesthesia Transfer of Care Note  Patient: Beth Parker  Procedure(s) Performed: Procedure(s): COLONOSCOPY (N/A) ESOPHAGOGASTRODUODENOSCOPY (EGD) (N/A)  Patient Location: Endoscopy Unit  Anesthesia Type:General  Level of Consciousness: awake, alert , oriented and patient cooperative  Airway & Oxygen Therapy: Patient Spontanous Breathing and Patient connected to nasal cannula oxygen  Post-op Assessment: Report given to RN, Post -op Vital signs reviewed and stable and Patient moving all extremities X 4  Post vital signs: Reviewed and stable  Last Vitals:  Filed Vitals:   05/30/14 1155  BP: 146/91  Pulse: 74  Temp: 36.8 C  Resp: 17    Complications: No apparent anesthesia complications

## 2014-05-30 NOTE — Op Note (Signed)
[facility] Patient Name: Beth HartshornKaren Parker Procedure Date: 05/30/2014 10:22 AM MRN: [id number] Account #: [id number] Date of Birth: Nov 05, 1955 Admit Type: Outpatient Age: [age] Room[facility] Gender: Female Note Status: Finalized Procedure:         Upper GI endoscopy Indications:       Suspected esophageal reflux Providers:         Ezzard StandingPaul Y. Bluford Kaufmannh, MD Referring MD:      Sallye LatNo Local Md, MD (Referring MD) Medicines:         Monitored Anesthesia Care Complications:     No immediate complications. Procedure:         Pre-Anesthesia Assessment:                    - Prior to the procedure, a History and Physical was                     performed, and patient medications, allergies and                     sensitivities were reviewed. The patient's tolerance of                     previous anesthesia was reviewed.                    - The risks and benefits of the procedure and the sedation                     options and risks were discussed with the patient. All                     questions were answered and informed consent was obtained.                    - After reviewing the risks and benefits, the patient was                     deemed in satisfactory condition to undergo the procedure.                    After obtaining informed consent, the endoscope was passed                     under direct vision. Throughout the procedure, the                     patient's blood pressure, pulse, and oxygen saturations                     were monitored continuously. The Endoscope was introduced                     through the mouth, and advanced to the second part of                     duodenum. The upper GI endoscopy was accomplished without                     difficulty. The patient tolerated the procedure well. Findings:      LA Grade A (one or more mucosal breaks less than 5 mm, not extending       between tops of 2 mucosal folds) esophagitis was found at the  gastroesophageal junction. Biopsies were taken with a cold forceps for       histology.      The exam was otherwise without abnormality.      The entire examined stomach was normal.      The examined duodenum was normal. Impression:        - LA Grade A reflux esophagitis. Biopsied.                    - The examination was otherwise normal.                    - Normal stomach.                    - Normal examined duodenum. Recommendation:    - Discharge patient to home.                    - Observe patient's clinical course.                    - Continue present medications.                    - Await pathology results.                    - The findings and recommendations were discussed with the                     patient. Procedure Code(s): --- Professional ---                    [id number], Esophagogastroduodenoscopy, flexible, transoral;                     with biopsy, single or multiple Diagnosis Code(s): --- Professional ---                    K21.0, Gastro-esophageal reflux disease with esophagitis CPT copyright 2014 American Medical Association. All rights reserved. The codes documented in this report are preliminary and upon coder review may  be revised to meet current compliance requirements. Wallace CullensPaul Y Telvin Reinders, MD 05/30/2014 11:28:52 AM This report has been signed electronically. Number of Addenda: 0 Note Initiated On: 05/30/2014 10:22 AM      [facility]

## 2014-05-30 NOTE — Anesthesia Preprocedure Evaluation (Signed)
Anesthesia Evaluation  Patient identified by MRN, date of birth, ID band Patient awake    Reviewed: Allergy & Precautions, H&P , NPO status , Patient's Chart, lab work & pertinent test results, reviewed documented beta blocker date and time   Airway Mallampati: II  TM Distance: >3 FB Neck ROM: full    Dental no notable dental hx. (+) Teeth Intact   Pulmonary neg pulmonary ROS,  breath sounds clear to auscultation  Pulmonary exam normal       Cardiovascular Exercise Tolerance: Good hypertension, negative cardio ROS  Rhythm:regular Rate:Normal     Neuro/Psych  Headaches, PSYCHIATRIC DISORDERS negative neurological ROS  negative psych ROS   GI/Hepatic negative GI ROS, Neg liver ROS, GERD-  ,  Endo/Other  negative endocrine ROS  Renal/GU negative Renal ROS  negative genitourinary   Musculoskeletal   Abdominal   Peds  Hematology negative hematology ROS (+)   Anesthesia Other Findings   Reproductive/Obstetrics negative OB ROS                             Anesthesia Physical Anesthesia Plan  ASA: III  Anesthesia Plan: General   Post-op Pain Management:    Induction:   Airway Management Planned:   Additional Equipment:   Intra-op Plan:   Post-operative Plan:   Informed Consent: I have reviewed the patients History and Physical, chart, labs and discussed the procedure including the risks, benefits and alternatives for the proposed anesthesia with the patient or authorized representative who has indicated his/her understanding and acceptance.   Dental Advisory Given  Plan Discussed with: CRNA  Anesthesia Plan Comments:         Anesthesia Quick Evaluation

## 2014-05-30 NOTE — Anesthesia Postprocedure Evaluation (Signed)
  Anesthesia Post-op Note  Patient: Beth Parker  Procedure(s) Performed: Procedure(s): COLONOSCOPY (N/A) ESOPHAGOGASTRODUODENOSCOPY (EGD) (N/A)  Anesthesia type:General  Patient location: PACU  Post pain: Pain level controlled  Post assessment: Post-op Vital signs reviewed, Patient's Cardiovascular Status Stable, Respiratory Function Stable, Patent Airway and No signs of Nausea or vomiting  Post vital signs: Reviewed and stable  Last Vitals:  Filed Vitals:   05/30/14 1230  BP:   Pulse: 75  Temp:   Resp: 12    Level of consciousness: awake, alert  and patient cooperative  Complications: No apparent anesthesia complications

## 2014-05-31 LAB — SURGICAL PATHOLOGY

## 2014-06-01 ENCOUNTER — Encounter: Payer: Self-pay | Admitting: Gastroenterology

## 2015-06-19 ENCOUNTER — Emergency Department
Admission: EM | Admit: 2015-06-19 | Discharge: 2015-06-19 | Disposition: A | Discharge status: Home / Self Care | Payer: Medicaid Other | Attending: Emergency Medicine | Admitting: Emergency Medicine

## 2015-06-19 ENCOUNTER — Encounter: Payer: Self-pay | Admitting: Emergency Medicine

## 2015-06-19 DIAGNOSIS — I10 Essential (primary) hypertension: Secondary | ICD-10-CM | POA: Insufficient documentation

## 2015-06-19 DIAGNOSIS — Z79899 Other long term (current) drug therapy: Secondary | ICD-10-CM | POA: Insufficient documentation

## 2015-06-19 DIAGNOSIS — M545 Low back pain: Secondary | ICD-10-CM | POA: Diagnosis present

## 2015-06-19 DIAGNOSIS — M479 Spondylosis, unspecified: Secondary | ICD-10-CM | POA: Insufficient documentation

## 2015-06-19 DIAGNOSIS — Z791 Long term (current) use of non-steroidal anti-inflammatories (NSAID): Secondary | ICD-10-CM | POA: Diagnosis not present

## 2015-06-19 DIAGNOSIS — M5410 Radiculopathy, site unspecified: Secondary | ICD-10-CM | POA: Insufficient documentation

## 2015-06-19 DIAGNOSIS — F329 Major depressive disorder, single episode, unspecified: Secondary | ICD-10-CM | POA: Diagnosis not present

## 2015-06-19 DIAGNOSIS — Z87891 Personal history of nicotine dependence: Secondary | ICD-10-CM | POA: Insufficient documentation

## 2015-06-19 DIAGNOSIS — M179 Osteoarthritis of knee, unspecified: Secondary | ICD-10-CM | POA: Insufficient documentation

## 2015-06-19 MED ORDER — DEXAMETHASONE SODIUM PHOSPHATE 10 MG/ML IJ SOLN
10.0000 mg | Freq: Once | INTRAMUSCULAR | Status: AC
  Administered 2015-06-19: 10 mg via INTRAMUSCULAR
  Filled 2015-06-19: qty 1

## 2015-06-19 MED ORDER — METHYLPREDNISOLONE 4 MG PO TBPK: ORAL_TABLET | ORAL | Status: DC

## 2015-06-19 MED ORDER — HYDROMORPHONE HCL 1 MG/ML IJ SOLN
1.0000 mg | Freq: Once | INTRAMUSCULAR | Status: AC
  Administered 2015-06-19: 1 mg via INTRAMUSCULAR
  Filled 2015-06-19: qty 1

## 2015-06-19 NOTE — ED Notes (Signed)
Pt to ed with c/o lower back pain and radiating down right leg.

## 2015-06-19 NOTE — ED Provider Notes (Signed)
[facility] Emergency Department Provider Note   ____________________________________________  Time seen: Approximately 10:26 AM  I have reviewed the triage vital signs and the nursing notes.   HISTORY  Chief Complaint Back Pain    HPI Beth Parker is a 60 y.o. female patient complaining of low back pain radiating down left leg.Patient has history of chronic low back pain. Patient state her pain is worsened in the last 3 days. Patient denies any bladder or bowel dysfunction. Patient stated pain is radiating down her right leg. Patient is under pain management currently takes multiple Hydrocodone. Patient states she's looking for new pain management doctor since his medication is not helping.   Past Medical History  Diagnosis Date  . Rib fracture     R 5th, 01/2011  . Migraine   . Depression   . Anxiety   . Arthritis     knee and back, h/o chronic L1 fracture  . GERD (gastroesophageal reflux disease)   . Hypertension   . High cholesterol   . Back pain     h/o L1 fracture    Patient Active Problem List   Diagnosis Date Noted  . Routine general medical examination at a health care facility 10/16/2011  . Hyperglycemia 10/16/2011  . Shoulder pain 05/17/2011  . Migraine 04/25/2011  . Fracture of rib of right side 02/07/2011  . 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 Breast mass, right 06/13/2010  . BACK PAIN, CHRONIC 12/31/2006  . HYPERCHOLESTEROLEMIA 07/31/2006  . STATE, SYMPTOMATIC MENOPAUSE/FEM CLIMACTERIC 06/27/2006  . ANXIETY 06/26/2006  . DEPRESSION 06/26/2006  . HYPERTENSION 06/26/2006  . HEMORRHOIDS 06/26/2006  . GERD 06/26/2006    Past Surgical History  Procedure Laterality Date  . Hemorrhoid surgery      internal and external  . Cervical disc surgery  1994  . Wrist surgery      right (ganglion cyst)  . Ankle surgery  2005  . Cesarean section    . Breast biopsy  08-23-2010    right (benign)  . Tubal ligation    .  Abdominal hysterectomy  1986    Right ovary remains  . Colonoscopy N/A 05/30/2014    Procedure: COLONOSCOPY;  Surgeon: Wallace Cullens, MD;  Location: [facility] ENDOSCOPY;  Service: Gastroenterology;  Laterality: N/A;  . Esophagogastroduodenoscopy N/A 05/30/2014    Procedure: ESOPHAGOGASTRODUODENOSCOPY (EGD);  Surgeon: Wallace Cullens, MD;  Location: [facility] ENDOSCOPY;  Service: Gastroenterology;  Laterality: N/A;    Current Outpatient Rx  Name  Route  Sig  Dispense  Refill  . meloxicam (MOBIC) 15 MG tablet   Oral   Take 15 mg by mouth daily.         Marland Kitchen ALPRAZolam (XANAX) 1 MG tablet   Oral   Take 1 mg by mouth 3 (three) times daily.           Marland Kitchen atorvastatin (LIPITOR) 40 MG tablet   Oral   Take 1 tablet (40 mg total) by mouth daily.   90 tablet   3   . budesonide-formoterol (SYMBICORT) 160-4.5 MCG/ACT inhaler   Inhalation   Inhale 2 puffs into the lungs 2 (two) times daily.         Tery Sanfilippo Calcium (STOOL SOFTENER PO)   Oral   Take 1 capsule by mouth 2 (two) times daily.         Marland Kitchen etodolac (LODINE) 400 MG tablet   Oral   Take 400 mg by mouth 2 (two) times daily.         Marland Kitchen  furosemide (LASIX) 20 MG tablet   Oral   Take 20 mg by mouth daily.         Marland Kitchen. HYDROcodone-acetaminophen (LORTAB) 7.5-500 MG per tablet   Oral   Take 1 tablet by mouth every 4 (four) hours as needed for pain.          . hydrOXYzine (ATARAX/VISTARIL) 25 MG tablet   Oral   Take 25 mg by mouth at bedtime.         Marland Kitchen. lisinopril (PRINIVIL,ZESTRIL) 20 MG tablet   Oral   Take 20 mg by mouth daily.         . methylPREDNISolone (MEDROL DOSEPAK) 4 MG TBPK tablet      Take Tapered dose as directed   21 tablet   0   . metoCLOPramide (REGLAN) 10 MG tablet   Oral   Take 1 tablet (10 mg total) by mouth 3 (three) times daily as needed (for nausea).   20 tablet   0   . metoprolol (LOPRESSOR) 50 MG tablet   Oral   Take 50 mg by mouth 2 (two) times daily.         . metoprolol succinate (TOPROL-XL) 100 MG  24 hr tablet   Oral   Take 1 tablet (100 mg total) by mouth daily. Take with or immediately following a meal.   90 tablet   3   . mirtazapine (REMERON) 15 MG tablet   Oral   Take 15 mg by mouth at bedtime.         Marland Kitchen. omeprazole (PRILOSEC) 40 MG capsule   Oral   Take 40 mg by mouth daily.         . ondansetron (ZOFRAN) 4 MG tablet   Oral   Take 4 mg by mouth every 8 (eight) hours as needed for nausea or vomiting.         . pantoprazole (PROTONIX) 40 MG tablet   Oral   Take 1 tablet (40 mg total) by mouth 2 (two) times daily.   180 tablet   3   . PARoxetine (PAXIL) 10 MG tablet   Oral   Take 10 mg by mouth daily.         . quinapril (ACCUPRIL) 20 MG tablet   Oral   Take 1 tablet (20 mg total) by mouth daily.   90 tablet   3   . simvastatin (ZOCOR) 40 MG tablet   Oral   Take 40 mg by mouth daily.         Marland Kitchen. tiotropium (SPIRIVA) 18 MCG inhalation capsule   Inhalation   Place 18 mcg into inhaler and inhale daily.         . traZODone (DESYREL) 150 MG tablet   Oral   Take 150 mg by mouth at bedtime.           Allergies Fluphenazine  Family History  Problem Relation Age of Onset  . Hypertension Mother   . Alzheimer's disease Mother   . Cancer Father     prostate  . Mental illness Father   . Mental illness Brother   . Mental illness Sister   . Breast cancer Sister   . Cancer Sister     breast cancer  . Colon cancer Neg Hx     Social History Social History  Substance Use Topics  . Smoking status: Former Games developermoker  . Smokeless tobacco: Never Used     Comment: quit over 8 years ago  . Alcohol  Use: No    Review of Systems Constitutional: No fever/chills Eyes: No visual changes. ENT: No sore throat. Cardiovascular: Denies chest pain. Respiratory: Denies shortness of breath. Gastrointestinal: No abdominal pain.  No nausea, no vomiting.  No diarrhea.  No constipation. Genitourinary: Negative for dysuria. Musculoskeletal:Chronic back pain  under pain management. Skin: Negative for rash. Neurological: Negative for headaches, focal weakness or numbness. Psychiatric:anxiety/depression Endocrine:HTN Allergic/Immunilogical: Fluphenazine.  ____________________________________________   PHYSICAL EXAM:  VITAL SIGNS: ED Triage Vitals  Enc Vitals Group     BP 06/19/15 1016 132/72 mmHg     Pulse Rate 06/19/15 1016 67     Resp 06/19/15 1016 20     Temp 06/19/15 1016 98 F (36.7 C)     Temp Source 06/19/15 1016 Oral     SpO2 06/19/15 1016 95 %     Weight 06/19/15 1016 220 lb (99.791 kg)     Height 06/19/15 1016 5\' 2"  (1.575 m)     Head Cir --      Peak Flow --      Pain Score 06/19/15 1016 10     Pain Loc --      Pain Edu? --      Excl. in GC? --     Constitutional: Alert and oriented. Well appearing and in no acute distress. Eyes: Conjunctivae are normal. PERRL. EOMI. Head: Atraumatic. Nose: No congestion/rhinnorhea. Mouth/Throat: Mucous membranes are moist.  Oropharynx non-erythematous. Neck: No stridor.  No cervical spine tenderness to palpation. Hematological/Lymphatic/Immunilogical: No cervical lymphadenopathy. Cardiovascular: Normal rate, regular rhythm. Grossly normal heart sounds.  Good peripheral circulation. Respiratory: Normal respiratory effort.  No retractions. Lungs CTAB. Gastrointestinal: Soft and nontender. No distention. No abdominal bruits. No CVA tenderness. Musculoskeletal: No lower extremity tenderness nor edema.  No joint effusions. Neurologic:  Normal speech and language. No gross focal neurologic deficits are appreciated. No gait instability. Skin:  Skin is warm, dry and intact. No rash noted. Psychiatric: Mood and affect are normal. Speech and behavior are normal.  ____________________________________________   LABS (all labs ordered are listed, but only abnormal results are displayed)  Labs Reviewed - No data to  display ____________________________________________  EKG   ____________________________________________  RADIOLOGY   ____________________________________________   PROCEDURES  Procedure(s) performed: None  Critical Care performed: No  ____________________________________________   INITIAL IMPRESSION / ASSESSMENT AND PLAN / ED COURSE  Pertinent labs & imaging results that were available during my care of the patient were reviewed by me and considered in my medical decision making (see chart for details).  Radicular back pain. Chronic back pain. Patient is under pain management. Discussed patient rationale for not prescribing narcotic pain medications that she is in the pain management. Patient given a prescription for Medrol Dosepak and advise continue taking her Percocets which was prescribed earlier this month. ____________________________________________   FINAL CLINICAL IMPRESSION(S) / ED DIAGNOSES  Final diagnoses:  Radicular low back pain      NEW MEDICATIONS STARTED DURING THIS VISIT:  New Prescriptions   METHYLPREDNISOLONE (MEDROL DOSEPAK) 4 MG TBPK TABLET    Take Tapered dose as directed     Note:  This document was prepared using Dragon voice recognition software and may include unintentional dictation errors.    Joni Reining, PA-C 06/19/15 1108  Jennye Moccasin, MD 06/19/15 1240

## 2015-06-19 NOTE — Discharge Instructions (Signed)
Radicular Pain °Radicular pain in either the arm or leg is usually from a bulging or herniated disk in the spine. A piece of the herniated disk may press against the nerves as the nerves exit the spine. This causes pain which is felt at the tips of the nerves down the arm or leg. Other causes of radicular pain may include: °· Fractures. °· Heart disease. °· Cancer. °· An abnormal and usually degenerative state of the nervous system or nerves (neuropathy). °Diagnosis may require CT or MRI scanning to determine the primary cause.  °Nerves that start at the neck (nerve roots) may cause radicular pain in the outer shoulder and arm. It can spread down to the thumb and fingers. The symptoms vary depending on which nerve root has been affected. In most cases radicular pain improves with conservative treatment. Neck problems may require physical therapy, a neck collar, or cervical traction. Treatment may take many weeks, and surgery may be considered if the symptoms do not improve.  °Conservative treatment is also recommended for sciatica. Sciatica causes pain to radiate from the lower back or buttock area down the leg into the foot. Often there is a history of back problems. Most patients with sciatica are better after 2 to 4 weeks of rest and other supportive care. Short term bed rest can reduce the disk pressure considerably. Sitting, however, is not a good position since this increases the pressure on the disk. You should avoid bending, lifting, and all other activities which make the problem worse. Traction can be used in severe cases. Surgery is usually reserved for patients who do not improve within the first months of treatment. °Only take over-the-counter or prescription medicines for pain, discomfort, or fever as directed by your caregiver. Narcotics and muscle relaxants may help by relieving more severe pain and spasm and by providing mild sedation. Cold or massage can give significant relief. Spinal manipulation  is not recommended. It can increase the degree of disc protrusion. Epidural steroid injections are often effective treatment for radicular pain. These injections deliver medicine to the spinal nerve in the space between the protective covering of the spinal cord and back bones (vertebrae). Your caregiver can give you more information about steroid injections. These injections are most effective when given within two weeks of the onset of pain.  °You should see your caregiver for follow up care as recommended. A program for neck and back injury rehabilitation with stretching and strengthening exercises is an important part of management.  °SEEK IMMEDIATE MEDICAL CARE IF: °· You develop increased pain, weakness, or numbness in your arm or leg. °· You develop difficulty with bladder or bowel control. °· You develop abdominal pain. °  °This information is not intended to replace advice given to you by your health care provider. Make sure you discuss any questions you have with your health care provider. °  °Document Released: 02/15/2004 Document Revised: 01/28/2014 Document Reviewed: 08/03/2014 °Elsevier Interactive Patient Education ©2016 [employer] ° °

## 2015-06-19 NOTE — ED Notes (Signed)
See triage note  States she has a hx of chronic back pain but states pain is worse for the past 2 days  Unable to sleep at night  currently taking mobic and hydrocodone without relief

## 2015-10-19 ENCOUNTER — Encounter: Payer: Self-pay | Admitting: *Deleted

## 2015-10-19 ENCOUNTER — Emergency Department
Admission: EM | Admit: 2015-10-19 | Discharge: 2015-10-19 | Disposition: A | Discharge status: Home / Self Care | Payer: Medicaid Other | Attending: Emergency Medicine | Admitting: Emergency Medicine

## 2015-10-19 DIAGNOSIS — G8929 Other chronic pain: Secondary | ICD-10-CM

## 2015-10-19 DIAGNOSIS — M549 Dorsalgia, unspecified: Secondary | ICD-10-CM | POA: Diagnosis not present

## 2015-10-19 DIAGNOSIS — I10 Essential (primary) hypertension: Secondary | ICD-10-CM | POA: Diagnosis not present

## 2015-10-19 DIAGNOSIS — Z87891 Personal history of nicotine dependence: Secondary | ICD-10-CM | POA: Diagnosis not present

## 2015-10-19 DIAGNOSIS — Z79899 Other long term (current) drug therapy: Secondary | ICD-10-CM | POA: Diagnosis not present

## 2015-10-19 DIAGNOSIS — M545 Low back pain: Secondary | ICD-10-CM | POA: Diagnosis present

## 2015-10-19 MED ORDER — HYDROCODONE-ACETAMINOPHEN 10-325 MG PO TABS
1.0000 | ORAL_TABLET | Freq: Once | ORAL | Status: AC
  Administered 2015-10-19: 1 via ORAL
  Filled 2015-10-19: qty 1

## 2015-10-19 NOTE — Discharge Instructions (Signed)
Please contact your pain management provider to discuss the issues you are having at the pharmacy. Unfortunately we cannot refill chronic pain medications in the emergency department as it is hospital policy.

## 2015-10-19 NOTE — ED Provider Notes (Signed)
[facility] Emergency Department Provider Note  ____________________________________________  Time seen: Approximately 6:17 PM  I have reviewed the triage vital signs and the nursing notes.   HISTORY  Chief Complaint Back Pain and Leg Pain    HPI Beth Parker is a 60 y.o. female , NAD, presents to the emergency department for evaluation of chronic pain. Patient states she has chronic lower back pain that radiates into the right leg and is in pain management. [facility]. She states she takes Vicodin 10 mg tablets 4 times daily for chronic pain management. States she has an active prescription for the medication but her pharmacy would not fill the medicine due to the new opioid restrictions. States her husband has contacted her primary care provider and her insurance company without any resolution of the issue. States that she has no acute change in her pain levels and they are the same. Has not had any injuries, traumas or falls. Denies any numbness, weakness or tingling. Has not had any saddle paresthesias nor loss of bowel or bladder control. Denies any chest pain, shortness breath, abdominal pain, nausea or vomiting.   Past Medical History:  Diagnosis Date  . Anxiety   . Arthritis    knee and back, h/o chronic L1 fracture  . Back pain    h/o L1 fracture  . Depression   . GERD (gastroesophageal reflux disease)   . High cholesterol   . Hypertension   . Migraine   . Rib fracture    R 5th, 01/2011    Patient Active Problem List   Diagnosis Date Noted  . Routine general medical examination at a health care facility 10/16/2011  . Hyperglycemia 10/16/2011  . Shoulder pain 05/17/2011  . Migraine 04/25/2011  . Fracture of rib of right side 02/07/2011  . 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 Breast mass, right 06/13/2010  . BACK PAIN, CHRONIC 12/31/2006  .  HYPERCHOLESTEROLEMIA 07/31/2006  . STATE, SYMPTOMATIC MENOPAUSE/FEM CLIMACTERIC 06/27/2006  . ANXIETY 06/26/2006  . DEPRESSION 06/26/2006  . HYPERTENSION 06/26/2006  . HEMORRHOIDS 06/26/2006  . GERD 06/26/2006    Past Surgical History:  Procedure Laterality Date  . ABDOMINAL HYSTERECTOMY  1986   Right ovary remains  . ANKLE SURGERY  2005  . BREAST BIOPSY  08-23-2010   right (benign)  . CERVICAL DISC SURGERY  1994  . CESAREAN SECTION    . COLONOSCOPY N/A 05/30/2014   Procedure: COLONOSCOPY;  Surgeon: Wallace CullensPaul Y Oh, MD;  [facility];  Service: Gastroenterology;  Laterality: N/A;  . ESOPHAGOGASTRODUODENOSCOPY N/A 05/30/2014   Procedure: ESOPHAGOGASTRODUODENOSCOPY (EGD);  Surgeon: Wallace CullensPaul Y Oh, MD;  [facility];  Service: Gastroenterology;  Laterality: N/A;  . HEMORRHOID SURGERY     internal and external  . TUBAL LIGATION    . WRIST SURGERY     right (ganglion cyst)    Prior to Admission medications   Medication Sig Start Date End Date Taking? Authorizing Provider  ALPRAZolam Prudy Feeler(XANAX) 1 MG tablet Take 1 mg by mouth 3 (three) times daily.   10/03/10   Arta Silenceobert N Schaller, MD  atorvastatin (LIPITOR) 40 MG tablet Take 1 tablet (40 mg total) by mouth daily. 10/11/11   Joaquim NamGraham S Duncan, MD  budesonide-formoterol [facility](SYMBICORT) 160-4.5 MCG/ACT inhaler Inhale 2 puffs into the lungs 2 (two) times daily.    Historical Provider, MD  Docusate Calcium (STOOL SOFTENER PO) Take 1 capsule by mouth 2 (  two) times daily.    Historical Provider, MD  etodolac (LODINE) 400 MG tablet Take 400 mg by mouth 2 (two) times daily.    Historical Provider, MD  furosemide (LASIX) 20 MG tablet Take 20 mg by mouth daily.    Historical Provider, MD  HYDROcodone-acetaminophen (LORTAB) 7.5-500 MG per tablet Take 1 tablet by mouth every 4 (four) hours as needed for pain.     Historical Provider, MD  hydrOXYzine (ATARAX/VISTARIL) 25 MG tablet Take 25 mg by mouth at bedtime.    Historical Provider, MD  lisinopril  (PRINIVIL,ZESTRIL) 20 MG tablet Take 20 mg by mouth daily.    Historical Provider, MD  meloxicam (MOBIC) 15 MG tablet Take 15 mg by mouth daily.    Historical Provider, MD  methylPREDNISolone (MEDROL DOSEPAK) 4 MG TBPK tablet Take Tapered dose as directed 06/19/15   Joni Reining, PA-C  metoCLOPramide (REGLAN) 10 MG tablet Take 1 tablet (10 mg total) by mouth 3 (three) times daily as needed (for nausea). 03/22/12   Lowell Bouton, PA-C  metoprolol (LOPRESSOR) 50 MG tablet Take 50 mg by mouth 2 (two) times daily.    Historical Provider, MD  metoprolol succinate (TOPROL-XL) 100 MG 24 hr tablet Take 1 tablet (100 mg total) by mouth daily. Take with or immediately following a meal. 10/15/11   Joaquim Nam, MD  mirtazapine (REMERON) 15 MG tablet Take 15 mg by mouth at bedtime.    Historical Provider, MD  omeprazole (PRILOSEC) 40 MG capsule Take 40 mg by mouth daily.    Historical Provider, MD  ondansetron (ZOFRAN) 4 MG tablet Take 4 mg by mouth every 8 (eight) hours as needed for nausea or vomiting.    Historical Provider, MD  pantoprazole (PROTONIX) 40 MG tablet Take 1 tablet (40 mg total) by mouth 2 (two) times daily. 05/06/12   Joaquim Nam, MD  PARoxetine (PAXIL) 10 MG tablet Take 10 mg by mouth daily.    Historical Provider, MD  quinapril (ACCUPRIL) 20 MG tablet Take 1 tablet (20 mg total) by mouth daily. 10/15/11   Joaquim Nam, MD  simvastatin (ZOCOR) 40 MG tablet Take 40 mg by mouth daily.    Historical Provider, MD  tiotropium (SPIRIVA) 18 MCG inhalation capsule Place 18 mcg into inhaler and inhale daily.    Historical Provider, MD  traZODone (DESYREL) 150 MG tablet Take 150 mg by mouth at bedtime.    Historical Provider, MD    Allergies Fluphenazine  Family History  Problem Relation Age of Onset  . Hypertension Mother   . Alzheimer's disease Mother   . Cancer Father     prostate  . Mental illness Father   . Mental illness Brother   . Mental illness Sister   . Breast cancer  Sister   . Cancer Sister     breast cancer  . Colon cancer Neg Hx     Social History Social History  Substance Use Topics  . Smoking status: Former Games developer  . Smokeless tobacco: Never Used     Comment: quit over 8 years ago  . Alcohol use No     Review of Systems  Constitutional: No fatigue Cardiovascular: No chest pain. Respiratory: No shortness of breath. Gastrointestinal: No abdominal pain.  No nausea, vomiting.   Musculoskeletal: Positive for chronic lower back pain radiating into the right leg.  Neurological: Negative for numbness, weakness, tingling. No saddle paresthesias or loss of bowel or bladder control. 10-point ROS otherwise negative.  ____________________________________________  PHYSICAL EXAM:  VITAL SIGNS: ED Triage Vitals  Enc Vitals Group     BP 10/19/15 1734 (!) 154/85     Pulse Rate 10/19/15 1734 76     Resp 10/19/15 1734 17     Temp 10/19/15 1734 98.2 F (36.8 C)     Temp Source 10/19/15 1734 Oral     SpO2 10/19/15 1734 95 %     Weight 10/19/15 1735 220 lb (99.8 kg)     Height 10/19/15 1735 5\' 2"  (1.575 m)     Head Circumference --      Peak Flow --      Pain Score 10/19/15 1756 10     Pain Loc --      Pain Edu? --      Excl. in GC? --      Constitutional: Alert and oriented. Well appearing and in no acute distress. Eyes: Conjunctivae are normal.  Head: Atraumatic. Cardiovascular: Normal rate, regular rhythm. Normal S1 and S2.  Good peripheral circulation With 2+ pulses noted in bilateral lower extremities. Respiratory: Normal respiratory effort without tachypnea or retractions. Lungs CTAB with breath sounds noted in all lung fields. Musculoskeletal: No lower extremity tenderness nor edema.  No joint effusions. Neurologic:  Normal speech and language. No gross focal neurologic deficits are appreciated.  Skin:  Skin is warm, dry and intact. No rash noted. Psychiatric: Mood and affect are normal. Speech and behavior are normal. Patient  exhibits appropriate insight and judgement.   ____________________________________________   LABS  None ____________________________________________  EKG  None ____________________________________________  RADIOLOGY  None ____________________________________________    PROCEDURES  Procedure(s) performed: None   Procedures   Medications  HYDROcodone-acetaminophen (NORCO) 10-325 MG per tablet 1 tablet (1 tablet Oral Given 10/19/15 1848)     ____________________________________________   INITIAL IMPRESSION / ASSESSMENT AND PLAN / ED COURSE  Pertinent labs & imaging results that were available during my care of the patient were reviewed by me and considered in my medical decision making (see chart for details).  Clinical Course    Patient's diagnosis is consistent with Chronic back pain. Patient was given one dose of her chronic pain medicine in the emergency department this evening without side effects or adverse reaction. Patient is understanding of the hospital policy in which we cannot refill chronic pain medications and she must contact her pain management provider for assistance. Patient will be discharged home with instructions to contact her chronic pain management provider and discuss the issue she is having at the pharmacy with filling her prescription. Patient is to follow up with her primary care provider or chronic pain management provider if symptoms persist past this treatment course. Patient is given ED precautions to return to the ED for any worsening or new symptoms.    ____________________________________________  FINAL CLINICAL IMPRESSION(S) / ED DIAGNOSES  Final diagnoses:  Chronic back pain      NEW MEDICATIONS STARTED DURING THIS VISIT:  Discharge Medication List as of 10/19/2015  6:35 PM           Hope Pigeon, PA-C 10/19/15 1856    Emily Filbert, MD 10/19/15 [phone]

## 2015-10-19 NOTE — ED Notes (Signed)
Vs refused at d/c

## 2015-10-19 NOTE — ED Triage Notes (Signed)
Pt arrived to ED reporting right sided shoulder, lower back, right hip and right leg pain. Pain is chronic in nature but pt reports running out of hydrocodone 10 mg and due to insurance problems pt reports she can not get medication refilled. Pt reports last taking medication 3 days ago.

## 2018-04-03 ENCOUNTER — Emergency Department: Payer: Medicaid Other

## 2018-04-03 ENCOUNTER — Other Ambulatory Visit: Payer: Self-pay

## 2018-04-03 ENCOUNTER — Emergency Department
Admission: EM | Admit: 2018-04-03 | Discharge: 2018-04-03 | Disposition: A | Discharge status: Home / Self Care | Payer: Medicaid Other | Attending: Emergency Medicine | Admitting: Emergency Medicine

## 2018-04-03 DIAGNOSIS — Y999 Unspecified external cause status: Secondary | ICD-10-CM | POA: Insufficient documentation

## 2018-04-03 DIAGNOSIS — I10 Essential (primary) hypertension: Secondary | ICD-10-CM | POA: Insufficient documentation

## 2018-04-03 DIAGNOSIS — Y929 Unspecified place or not applicable: Secondary | ICD-10-CM | POA: Insufficient documentation

## 2018-04-03 DIAGNOSIS — W19XXXA Unspecified fall, initial encounter: Secondary | ICD-10-CM

## 2018-04-03 DIAGNOSIS — Z79899 Other long term (current) drug therapy: Secondary | ICD-10-CM | POA: Insufficient documentation

## 2018-04-03 DIAGNOSIS — M25512 Pain in left shoulder: Secondary | ICD-10-CM | POA: Insufficient documentation

## 2018-04-03 DIAGNOSIS — Z87891 Personal history of nicotine dependence: Secondary | ICD-10-CM | POA: Insufficient documentation

## 2018-04-03 DIAGNOSIS — W010XXA Fall on same level from slipping, tripping and stumbling without subsequent striking against object, initial encounter: Secondary | ICD-10-CM | POA: Insufficient documentation

## 2018-04-03 DIAGNOSIS — S52122A Displaced fracture of head of left radius, initial encounter for closed fracture: Secondary | ICD-10-CM | POA: Insufficient documentation

## 2018-04-03 DIAGNOSIS — Y939 Activity, unspecified: Secondary | ICD-10-CM | POA: Insufficient documentation

## 2018-04-03 DIAGNOSIS — M542 Cervicalgia: Secondary | ICD-10-CM | POA: Insufficient documentation

## 2018-04-03 MED ORDER — OXYCODONE-ACETAMINOPHEN 5-325 MG PO TABS
1.0000 | ORAL_TABLET | Freq: Once | ORAL | Status: AC
  Administered 2018-04-03: 1 via ORAL
  Filled 2018-04-03: qty 1

## 2018-04-03 MED ORDER — OXYCODONE-ACETAMINOPHEN 5-325 MG PO TABS: 1.0000 | ORAL_TABLET | Freq: Four times a day (QID) | ORAL | 0 refills | Status: AC | PRN

## 2018-04-03 MED ORDER — ONDANSETRON 4 MG PO TBDP
4.0000 mg | ORAL_TABLET | Freq: Once | ORAL | Status: AC
  Administered 2018-04-03: 4 mg via ORAL
  Filled 2018-04-03: qty 1

## 2018-04-03 NOTE — ED Notes (Signed)
All triage performed and documented by Eileen Stanford RN not Allayne Stack NT

## 2018-04-03 NOTE — ED Notes (Signed)
Pt reports tripping and falling forward she reached out to catch herself with her L arm and experiencing sharp pain in her L elbow. Pt denies hitting her head, LoC. Pt A&Ox4 at time of assessment.

## 2018-04-03 NOTE — ED Notes (Signed)
Removed all rings on fingers of L hand. Rings placed in clear plastic bag and presented to patient immediately after removal. Pt verified that all rings were present in the bag.

## 2018-04-03 NOTE — ED Provider Notes (Signed)
[facility] Emergency Department Provider Note  ____________________________________________  Time seen: Approximately 10:41 PM  I have reviewed the triage vital signs and the nursing notes.   HISTORY  Chief Complaint Arm Injury    HPI Beth Parker is a 63 y.o. female presents to the emergency department with acute left elbow pain.  Patient had a mechanical, non-syncopal fall after tripping and fell with an outstretched left arm.  Patient has experienced 10 out of 10 left elbow pain since.  Patient did not hit her head during the fall.  Patient states that she has had some mild neck discomfort as well as left shoulder pain.  No numbness or tingling of the bilateral upper extremities.  She denies chest pain, chest tightness or abdominal pain.  No other alleviating measures have been attempted.        Past Medical History:  Diagnosis Date  . Anxiety   . Arthritis    knee and back, h/o chronic L1 fracture  . Back pain    h/o L1 fracture  . Depression   . GERD (gastroesophageal reflux disease)   . High cholesterol   . Hypertension   . Migraine   . Rib fracture    R 5th, 01/2011    Patient Active Problem List   Diagnosis Date Noted  . Routine general medical examination at a health care facility 10/16/2011  . Hyperglycemia 10/16/2011  . Shoulder pain 05/17/2011  . Migraine 04/25/2011  . Fracture of rib of right side 02/07/2011  . 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 Breast mass, right 06/13/2010  . BACK PAIN, CHRONIC 12/31/2006  . HYPERCHOLESTEROLEMIA 07/31/2006  . STATE, SYMPTOMATIC MENOPAUSE/FEM CLIMACTERIC 06/27/2006  . ANXIETY 06/26/2006  . DEPRESSION 06/26/2006  . HYPERTENSION 06/26/2006  . HEMORRHOIDS 06/26/2006  . GERD 06/26/2006    Past Surgical History:  Procedure Laterality Date  . ABDOMINAL HYSTERECTOMY  1986   Right ovary remains  . ANKLE SURGERY  2005  . BREAST BIOPSY  08-23-2010   right (benign)  . CERVICAL DISC SURGERY   1994  . CESAREAN SECTION    . COLONOSCOPY N/A 05/30/2014   Procedure: COLONOSCOPY;  Surgeon: Wallace Cullens, MD;  Location: [facility] ENDOSCOPY;  Service: Gastroenterology;  Laterality: N/A;  . ESOPHAGOGASTRODUODENOSCOPY N/A 05/30/2014   Procedure: ESOPHAGOGASTRODUODENOSCOPY (EGD);  Surgeon: Wallace Cullens, MD;  Location: [facility] ENDOSCOPY;  Service: Gastroenterology;  Laterality: N/A;  . HEMORRHOID SURGERY     internal and external  . TUBAL LIGATION    . WRIST SURGERY     right (ganglion cyst)    Prior to Admission medications   Medication Sig Start Date End Date Taking? Authorizing Provider  ALPRAZolam Prudy Feeler) 1 MG tablet Take 1 mg by mouth 3 (three) times daily.   10/03/10   Arta Silence, MD  atorvastatin (LIPITOR) 40 MG tablet Take 1 tablet (40 mg total) by mouth daily. 10/11/11   Joaquim Nam, MD  budesonide-formoterol [facility]) 160-4.5 MCG/ACT inhaler Inhale 2 puffs into the lungs 2 (two) times daily.    [provider]  Docusate Calcium (STOOL SOFTENER PO) Take 1 capsule by mouth 2 (two) times daily.    [provider]  etodolac (LODINE) 400 MG tablet Take 400 mg by mouth 2 (two) times daily.    [provider]  furosemide (LASIX) 20 MG tablet Take 20 mg by mouth daily.    [provider]  HYDROcodone-acetaminophen (LORTAB) 7.5-500 MG per tablet Take 1 tablet by mouth every 4 (  four) hours as needed for pain.     [provider]  hydrOXYzine (ATARAX/VISTARIL) 25 MG tablet Take 25 mg by mouth at bedtime.    [provider]  lisinopril (PRINIVIL,ZESTRIL) 20 MG tablet Take 20 mg by mouth daily.    [provider]  meloxicam (MOBIC) 15 MG tablet Take 15 mg by mouth daily.    [provider]  methylPREDNISolone (MEDROL DOSEPAK) 4 MG TBPK tablet Take Tapered dose as directed 06/19/15   Joni Reining, PA-C  metoCLOPramide (REGLAN) 10 MG tablet Take 1 tablet (10 mg total) by mouth 3 (three) times daily as needed (for nausea).  03/22/12   Lowell Bouton, PA-C  metoprolol (LOPRESSOR) 50 MG tablet Take 50 mg by mouth 2 (two) times daily.    [provider]  metoprolol succinate (TOPROL-XL) 100 MG 24 hr tablet Take 1 tablet (100 mg total) by mouth daily. Take with or immediately following a meal. 10/15/11   Joaquim Nam, MD  mirtazapine (REMERON) 15 MG tablet Take 15 mg by mouth at bedtime.    [provider]  omeprazole (PRILOSEC) 40 MG capsule Take 40 mg by mouth daily.    [provider]  ondansetron (ZOFRAN) 4 MG tablet Take 4 mg by mouth every 8 (eight) hours as needed for nausea or vomiting.    [provider]  oxyCODONE-acetaminophen (PERCOCET/ROXICET) 5-325 MG tablet Take 1 tablet by mouth every 6 (six) hours as needed for up to 3 days for severe pain. 04/03/18 04/06/18  Orvil Feil, PA-C  pantoprazole (PROTONIX) 40 MG tablet Take 1 tablet (40 mg total) by mouth 2 (two) times daily. 05/06/12   Joaquim Nam, MD  PARoxetine (PAXIL) 10 MG tablet Take 10 mg by mouth daily.    [provider]  quinapril (ACCUPRIL) 20 MG tablet Take 1 tablet (20 mg total) by mouth daily. 10/15/11   Joaquim Nam, MD  simvastatin (ZOCOR) 40 MG tablet Take 40 mg by mouth daily.    [provider]  tiotropium (SPIRIVA) 18 MCG inhalation capsule Place 18 mcg into inhaler and inhale daily.    [provider]  traZODone (DESYREL) 150 MG tablet Take 150 mg by mouth at bedtime.    [provider]    Allergies Fluphenazine  Family History  Problem Relation Age of Onset  . Hypertension Mother   . Alzheimer's disease Mother   . Cancer Father        prostate  . Mental illness Father   . Mental illness Brother   . Mental illness Sister   . Breast cancer Sister   . Cancer Sister        breast cancer  . Colon cancer Neg Hx     Social History Social History   Tobacco Use  . Smoking status: Former Games developer  . Smokeless tobacco: Never Used  . Tobacco comment:  quit over 8 years ago  Substance Use Topics  . Alcohol use: No  . Drug use: No     Review of Systems  Constitutional: No fever/chills Eyes: No visual changes. No discharge ENT: No upper respiratory complaints. Cardiovascular: no chest pain. Respiratory: no cough. No SOB. Gastrointestinal: No abdominal pain.  No nausea, no vomiting.  No diarrhea.  No constipation. Genitourinary: Negative for dysuria. No hematuria Musculoskeletal: Patient has left elbow pain.  Skin: Negative for rash, abrasions, lacerations, ecchymosis. Neurological: Negative for headaches, focal weakness or numbness.   ____________________________________________   PHYSICAL EXAM:  VITAL SIGNS: ED Triage Vitals  Enc Vitals Group     BP 04/03/18 1955 (!) 165/85     Pulse Rate 04/03/18 1955 70     Resp 04/03/18 1955 18     Temp 04/03/18 1955 98.3 F (36.8 C)     Temp Source 04/03/18 1955 Oral     SpO2 04/03/18 1955 96 %     Weight 04/03/18 1954 200 lb (90.7 kg)     Height 04/03/18 1954 5\' 3"  (1.6 m)     Head Circumference --      Peak Flow --      Pain Score 04/03/18 1953 9     Pain Loc --      Pain Edu? --      Excl. in GC? --      Constitutional: Alert and oriented. Well appearing and in no acute distress. Eyes: Conjunctivae are normal. PERRL. EOMI. Head: Atraumatic. Cardiovascular: Normal rate, regular rhythm. Normal S1 and S2.  Good peripheral circulation. Respiratory: Normal respiratory effort without tachypnea or retractions. Lungs CTAB. Good air entry to the bases with no decreased or absent breath sounds. Gastrointestinal: Bowel sounds 4 quadrants. Soft and nontender to palpation. No guarding or rigidity. No palpable masses. No distention. No CVA tenderness. Musculoskeletal: Patient is unable to perform full range of motion at the left elbow, likely secondary to pain.  Patient performs full range of motion at the left wrist and left shoulder.  Palpable radial pulse, left. Neurologic:  Normal  speech and language. No gross focal neurologic deficits are appreciated.  Skin:  Skin is warm, dry and intact. No rash noted. Psychiatric: Mood and affect are normal. Speech and behavior are normal. Patient exhibits appropriate insight and judgement.   ____________________________________________   LABS (all labs ordered are listed, but only abnormal results are displayed)  Labs Reviewed - No data to display ____________________________________________  EKG   ____________________________________________  RADIOLOGY I personally viewed and evaluated these images as part of my medical decision making, as well as reviewing the written report by the radiologist.  Dg Cervical Spine 2-3 Views  Result Date: 04/03/2018 CLINICAL DATA:  63 year old female with fall, neck pain. EXAM: CERVICAL SPINE - 2-3 VIEW COMPARISON:  Cervical spine MRI 10/06/2013 and earlier. FINDINGS: Mild reversal of cervical lordosis. Prevertebral soft tissue contour remains normal. Cervicothoracic junction alignment is within normal limits. Normal C1-C2 alignment and joint spaces. Normal AP alignment. Multilevel disc space loss and endplate spurring. No acute osseous abnormality identified. Negative visible upper chest. IMPRESSION: No acute osseous abnormality identified in the cervical spine. Multilevel disc and endplate degeneration. Electronically Signed   By: Odessa FlemingH  Hall M.D.   On: 04/03/2018 21:48   Dg Elbow Complete Left  Result Date: 04/03/2018 CLINICAL DATA:  Larey SeatFell.  Left elbow pain. EXAM: LEFT ELBOW - COMPLETE 3+ VIEW COMPARISON:  None. FINDINGS: There is a complex comminuted intra-articular fracture of the radial head with associated joint effusion. No other fractures are identified. There are moderate degenerative changes and a large olecranon spur. IMPRESSION: 1. Complex comminuted intra-articular fracture of the radial head. 2. Mild degenerative changes and large olecranon spur. Electronically Signed   By: Rudie MeyerP.   Gallerani M.D.   On: 04/03/2018 20:29   Dg Wrist Complete Left  Result Date: 04/03/2018 CLINICAL DATA:  Larey SeatFell today and injured left wrist. EXAM: LEFT WRIST - COMPLETE 3+ VIEW COMPARISON:  None. FINDINGS: The joint spaces are fairly well maintained. There are degenerative changes involving the distal radial carpus and also  the [facility] joint of the thumb. No acute wrist fracture is identified. IMPRESSION: No acute bony findings. Mild degenerative changes. Electronically Signed   By: Rudie Meyer M.D.   On: 04/03/2018 20:31    ____________________________________________    PROCEDURES  Procedure(s) performed:    Procedures    Medications  oxyCODONE-acetaminophen (PERCOCET/ROXICET) 5-325 MG per tablet 1 tablet (1 tablet Oral Given 04/03/18 2215)  ondansetron (ZOFRAN-ODT) disintegrating tablet 4 mg (4 mg Oral Given 04/03/18 2214)     ____________________________________________   INITIAL IMPRESSION / ASSESSMENT AND PLAN / ED COURSE  Pertinent labs & imaging results that were available during my care of the patient were reviewed by me and considered in my medical decision making (see chart for details).  Review of the [place] CSRS was performed in accordance of the [facility] prior to dispensing any controlled drugs.           Assessment and plan Radial head fracture Patient presents to the emergency department with left elbow pain after a fall that occurred tonight.  Orthopedist on-call, Dr. Odis Luster was consulted.  Plan of care was reviewed with Dr. Odis Luster which included splinting and follow-up with orthopedics as an outpatient.  Dr. Odis Luster agreed with plan of care but stated that he wanted patient referred to Dr. Stephenie Acres.  Roxicet was given in the emergency department for pain.  All patient questions were answered.   ____________________________________________  FINAL CLINICAL IMPRESSION(S) / ED DIAGNOSES  Final diagnoses:  Fall, initial encounter      NEW MEDICATIONS STARTED DURING  THIS VISIT:  ED Discharge Orders         Ordered    oxyCODONE-acetaminophen (PERCOCET/ROXICET) 5-325 MG tablet  Every 6 hours PRN     04/03/18 2306              This chart was dictated using voice recognition software/Dragon. Despite best efforts to proofread, errors can occur which can change the meaning. Any change was purely unintentional.    Orvil Feil, PA-C 04/03/18 Lennette Bihari, MD 04/04/18 [phone]

## 2018-04-03 NOTE — ED Triage Notes (Signed)
Patient c/o left elbow and wrist pain post mechanical fall and hour ago.

## 2018-06-06 ENCOUNTER — Emergency Department
Admission: EM | Admit: 2018-06-06 | Discharge: 2018-06-06 | Disposition: A | Discharge status: Home / Self Care | Payer: Medicaid Other | Attending: Emergency Medicine | Admitting: Emergency Medicine

## 2018-06-06 ENCOUNTER — Other Ambulatory Visit: Payer: Self-pay

## 2018-06-06 ENCOUNTER — Emergency Department: Payer: Medicaid Other

## 2018-06-06 DIAGNOSIS — I10 Essential (primary) hypertension: Secondary | ICD-10-CM | POA: Diagnosis not present

## 2018-06-06 DIAGNOSIS — F419 Anxiety disorder, unspecified: Secondary | ICD-10-CM

## 2018-06-06 DIAGNOSIS — F331 Major depressive disorder, recurrent, moderate: Secondary | ICD-10-CM | POA: Diagnosis not present

## 2018-06-06 DIAGNOSIS — F1994 Other psychoactive substance use, unspecified with psychoactive substance-induced mood disorder: Secondary | ICD-10-CM

## 2018-06-06 DIAGNOSIS — R45851 Suicidal ideations: Secondary | ICD-10-CM | POA: Diagnosis not present

## 2018-06-06 DIAGNOSIS — F10929 Alcohol use, unspecified with intoxication, unspecified: Secondary | ICD-10-CM | POA: Insufficient documentation

## 2018-06-06 DIAGNOSIS — Z79899 Other long term (current) drug therapy: Secondary | ICD-10-CM | POA: Insufficient documentation

## 2018-06-06 DIAGNOSIS — F10920 Alcohol use, unspecified with intoxication, uncomplicated: Secondary | ICD-10-CM

## 2018-06-06 LAB — CBC
HCT: 38.2 % (ref 36.0–46.0)
Hemoglobin: 12.2 g/dL (ref 12.0–15.0)
MCH: 31.5 pg (ref 26.0–34.0)
MCHC: 31.9 g/dL (ref 30.0–36.0)
MCV: 98.7 fL (ref 80.0–100.0)
Platelets: 275 10*3/uL (ref 150–400)
RBC: 3.87 MIL/uL (ref 3.87–5.11)
RDW: 11.9 % (ref 11.5–15.5)
WBC: 10.5 10*3/uL (ref 4.0–10.5)
nRBC: 0 % (ref 0.0–0.2)

## 2018-06-06 LAB — COMPREHENSIVE METABOLIC PANEL
ALT: 22 U/L (ref 0–44)
AST: 21 U/L (ref 15–41)
Albumin: 4.2 g/dL (ref 3.5–5.0)
Alkaline Phosphatase: 112 U/L (ref 38–126)
Anion gap: 14 (ref 5–15)
BUN: 32 mg/dL — ABNORMAL HIGH (ref 8–23)
CO2: 26 mmol/L (ref 22–32)
Calcium: 9.2 mg/dL (ref 8.9–10.3)
Chloride: 97 mmol/L — ABNORMAL LOW (ref 98–111)
Creatinine, Ser: 1.58 mg/dL — ABNORMAL HIGH (ref 0.44–1.00)
GFR calc Af Amer: 41 mL/min — ABNORMAL LOW (ref >60)
GFR calc non Af Amer: 35 mL/min — ABNORMAL LOW (ref >60)
Glucose, Bld: 117 mg/dL — ABNORMAL HIGH (ref 70–99)
Potassium: 3.3 mmol/L — ABNORMAL LOW (ref 3.5–5.1)
Sodium: 137 mmol/L (ref 135–145)
Total Bilirubin: 0.3 mg/dL (ref 0.3–1.2)
Total Protein: 8.1 g/dL (ref 6.5–8.1)

## 2018-06-06 LAB — URINE DRUG SCREEN, QUALITATIVE (ARMC ONLY)
Amphetamines, Ur Screen: NOT DETECTED
Barbiturates, Ur Screen: NOT DETECTED
Benzodiazepine, Ur Scrn: POSITIVE — AB
Cannabinoid 50 Ng, Ur ~~LOC~~: NOT DETECTED
Cocaine Metabolite,Ur ~~LOC~~: NOT DETECTED
MDMA (Ecstasy)Ur Screen: NOT DETECTED
Methadone Scn, Ur: NOT DETECTED
Opiate, Ur Screen: POSITIVE — AB
Phencyclidine (PCP) Ur S: NOT DETECTED
Tricyclic, Ur Screen: NOT DETECTED

## 2018-06-06 LAB — ETHANOL: Alcohol, Ethyl (B): 82 mg/dL — ABNORMAL HIGH (ref <10)

## 2018-06-06 LAB — SALICYLATE LEVEL: Salicylate Lvl: <7 mg/dL (ref 2.8–30.0)

## 2018-06-06 LAB — ACETAMINOPHEN LEVEL: Acetaminophen (Tylenol), Serum: 13 ug/mL (ref 10–30)

## 2018-06-06 NOTE — ED Notes (Signed)
Pt moaning loudly in room. Advised pt to not yell as other patients are sleeping. Pt stated she is in pain because of her elbow. Advised pt that she broke her elbow in March and they would not give her narcotics. Pt stated her back hurt. Advised pt again she would only receive non narcotic pain meds. Pt declined. Pt asked how long she would be here. Advised pt till she was sober. Pt states she is sober. Advised pt her alcohol level is 84.

## 2018-06-06 NOTE — ED Notes (Signed)
Pt was standing at nursing station prior to discharge and preceded to sit down in wheelchair.  Wheelchair slid out from under her and patient landed on her bottom and rolled backwards.  There are no visible injuries.

## 2018-06-06 NOTE — Consult Note (Signed)
[facility] ED Discharge  06/06/2018 1:03 PM Beth Parker  MRN:  [id number]   Principal Problem: Suicidal ideation, IVC  Discharge Diagnoses:  Alcohol intoxication Substance-induced mood disorder Anxiety Chronic pain Hypertension GERD    Subjective: This is a 63 year old Caucasian female who was presented to the emergency department under a IVC, reported suicidal ideation.  The patient had contacted her daughter for having a fight with her husband, the context of drinking a bottle of wine, while also being prescribed benzodiazepines and opiates.  Reportedly the fight with her husband was secondary to their living situation being in poor condition and his poor motivation to help.  During the phone conversation with her daughter she had requested that her daughter pick her up and take her to live with her, and somehow she ended up making the statement that she would "run her car up a tree".  Her initial BAL was 82 at 3 AM.  On interview, patient reports that she is feeling "fine", and conveys that she does not believe that she needs to be in the hospital.  She is currently denying any suicidal or homicidal ideations.  She reports that from her perspective, her daughter misunderstood the statement that she made while she was intoxicated.  She reports only occasional alcohol use with last use several years ago.  She reports that she feels safe and has never had a previous suicide attempt, she is future oriented, she has no firearms at home, she reports she is strongly religious and would never hurt herself due to fear of going to hell, she reports she has a series of supportive relationships in the area, include an adopted grandmother who lives a few neighborhoods over (who she refrained from contacting because she had drank alcohol).  Psychiatric review of systems: Patient denies feeling depressed, denies anhedonia, denies neurovegetative symptoms related to major depressive disorder, denies  neurovegetative symptoms related to bipolar disorder, denies auditory  visual hallucinations, reports having chronic anxiety related to "everything" which is well-controlled on her current regimen.  Denies delusional content such as paranoia, ideas of reference, thought broadcasting.    Total Time spent with patient: 20 minutes  Past Psychiatric History: Denies past psychiatric hospitalizations, denies past suicide attempts, reports past diagnosis of anxiety and depression which are managed by her family doctor.  She reports past psychiatric outpatient treatment with Dr. Alycia Rossetti who reportedly worked with [facility].  There are no notes under the psychiatry tab in her chart review.  She reports previously being prescribed Xanax.  Past Medical History:  Past Medical History:  Diagnosis Date  . Anxiety   . Arthritis    knee and back, h/o chronic L1 fracture  . Back pain    h/o L1 fracture  . Depression   . GERD (gastroesophageal reflux disease)   . High cholesterol   . Hypertension   . Migraine   . Rib fracture    R 5th, 01/2011    Past Surgical History:  Procedure Laterality Date  . ABDOMINAL HYSTERECTOMY  1986   Right ovary remains  . ANKLE SURGERY  2005  . BREAST BIOPSY  08-23-2010   right (benign)  . CERVICAL DISC SURGERY  1994  . CESAREAN SECTION    . COLONOSCOPY N/A 05/30/2014   Procedure: COLONOSCOPY;  Surgeon: Wallace Cullens, MD;  Location: [facility] ENDOSCOPY;  Service: Gastroenterology;  Laterality: N/A;  . ESOPHAGOGASTRODUODENOSCOPY N/A 05/30/2014   Procedure: ESOPHAGOGASTRODUODENOSCOPY (EGD);  Surgeon: Wallace Cullens, MD;  Location: [facility]  ENDOSCOPY;  Service: Gastroenterology;  Laterality: N/A;  . HEMORRHOID SURGERY     internal and external  . TUBAL LIGATION    . WRIST SURGERY     right (ganglion cyst)   Family History:  Family History  Problem Relation Age of Onset  . Hypertension Mother   . Alzheimer's disease Mother   . Cancer Father        prostate  . Mental  illness Father   . Mental illness Brother   . Mental illness Sister   . Breast cancer Sister   . Cancer Sister        breast cancer  . Colon cancer Neg Hx    Family Psychiatric  History: Sister has schizophrenia, Daughter has bipolar disorder Social History:  Social History   Substance and Sexual Activity  Alcohol Use No     Social History   Substance and Sexual Activity  Drug Use No    Social History   Socioeconomic History  . Marital status: Married    Spouse name: Not on file  . Number of children: 1  . Years of education: Not on file  . Highest education level: Not on file  Occupational History  . Occupation: hairdresser, retired  Engineer, productionocial Needs  . Financial resource strain: Not on file  . Food insecurity:    Worry: Not on file    Inability: Not on file  . Transportation needs:    Medical: Not on file    Non-medical: Not on file  Tobacco Use  . Smoking status: Former Games developermoker  . Smokeless tobacco: Never Used  . Tobacco comment: quit over 8 years ago  Substance and Sexual Activity  . Alcohol use: No  . Drug use: No  . Sexual activity: Not on file  Lifestyle  . Physical activity:    Days per week: Not on file    Minutes per session: Not on file  . Stress: Not on file  Relationships  . Social connections:    Talks on phone: Not on file    Gets together: Not on file    Attends religious service: Not on file    Active member of club or organization: Not on file    Attends meetings of clubs or organizations: Not on file    Relationship status: Not on file  Other Topics Concern  . Not on file  Social History Narrative  . Not on file    Has this patient used any form of tobacco in the last 30 days? (Cigarettes, Smokeless Tobacco, Cigars, and/or Pipes) no  Current Medications: No current facility-administered medications for this encounter.    Current Outpatient Medications  Medication Sig Dispense Refill  . ALPRAZolam (XANAX) 1 MG tablet Take 1 mg by  mouth 3 (three) times daily.      Marland Kitchen. atorvastatin (LIPITOR) 40 MG tablet Take 1 tablet (40 mg total) by mouth daily. 90 tablet 3  . budesonide-formoterol (SYMBICORT) 160-4.5 MCG/ACT inhaler Inhale 2 puffs into the lungs 2 (two) times daily.    Tery Sanfilippo. Docusate Calcium (STOOL SOFTENER PO) Take 1 capsule by mouth 2 (two) times daily.    Marland Kitchen. etodolac (LODINE) 400 MG tablet Take 400 mg by mouth 2 (two) times daily.    . furosemide (LASIX) 20 MG tablet Take 20 mg by mouth daily.    Marland Kitchen. HYDROcodone-acetaminophen (LORTAB) 7.5-500 MG per tablet Take 1 tablet by mouth every 4 (four) hours as needed for pain.     . hydrOXYzine (ATARAX/VISTARIL) 25  MG tablet Take 25 mg by mouth at bedtime.    Marland Kitchen lisinopril (PRINIVIL,ZESTRIL) 20 MG tablet Take 20 mg by mouth daily.    . meloxicam (MOBIC) 15 MG tablet Take 15 mg by mouth daily.    . methylPREDNISolone (MEDROL DOSEPAK) 4 MG TBPK tablet Take Tapered dose as directed 21 tablet 0  . metoCLOPramide (REGLAN) 10 MG tablet Take 1 tablet (10 mg total) by mouth 3 (three) times daily as needed (for nausea). 20 tablet 0  . metoprolol (LOPRESSOR) 50 MG tablet Take 50 mg by mouth 2 (two) times daily.    . metoprolol succinate (TOPROL-XL) 100 MG 24 hr tablet Take 1 tablet (100 mg total) by mouth daily. Take with or immediately following a meal. 90 tablet 3  . mirtazapine (REMERON) 15 MG tablet Take 15 mg by mouth at bedtime.    Marland Kitchen omeprazole (PRILOSEC) 40 MG capsule Take 40 mg by mouth daily.    . ondansetron (ZOFRAN) 4 MG tablet Take 4 mg by mouth every 8 (eight) hours as needed for nausea or vomiting.    . pantoprazole (PROTONIX) 40 MG tablet Take 1 tablet (40 mg total) by mouth 2 (two) times daily. 180 tablet 3  . PARoxetine (PAXIL) 10 MG tablet Take 10 mg by mouth daily.    . quinapril (ACCUPRIL) 20 MG tablet Take 1 tablet (20 mg total) by mouth daily. 90 tablet 3  . simvastatin (ZOCOR) 40 MG tablet Take 40 mg by mouth daily.    Marland Kitchen tiotropium (SPIRIVA) 18 MCG inhalation capsule  Place 18 mcg into inhaler and inhale daily.    . traZODone (DESYREL) 150 MG tablet Take 150 mg by mouth at bedtime.     PTA Medications: (Not in a hospital admission)   Musculoskeletal: Strength & Muscle Tone: within normal limits Gait & Station: normal Patient leans: N/A  Psychiatric Specialty Exam: Physical Exam  Constitutional: She is oriented to person, place, and time. She appears well-developed and well-nourished.  HENT:  Head: Normocephalic and atraumatic.  Eyes: EOM are normal.  Respiratory: Effort normal.  Neurological: She is alert and oriented to person, place, and time.  Psychiatric: Her behavior is normal. Judgment normal. Her mood appears not anxious. Cognition and memory are normal. She does not exhibit a depressed mood. She expresses no homicidal and no suicidal ideation. She expresses no suicidal plans and no homicidal plans.    ROS  Blood pressure (!) 105/50, pulse 89, temperature 98.4 F (36.9 C), temperature source Oral, resp. rate 18, height 5\' 3"  (1.6 m), weight 90.7 kg, SpO2 94 %.Body mass index is 35.43 kg/m.  General Appearance: Casual  Eye Contact:  Fair  Speech:  Clear and Coherent  Volume:  Normal  Mood:  "a little anxious, but ok"  Affect:  reactive  Thought Process:  Goal Directed and Linear  Orientation:  Full (Time, Place, and Person)  Thought Content:  Logical and devoid of AVH or delusional content  Suicidal Thoughts:  No  Homicidal Thoughts:  No  Memory:  Immediate;   Fair Recent;   Fair Remote;   Fair  Judgement:  Intact  Insight:  Present  Psychomotor Activity:  Normal  Concentration:  Concentration: Fair and Attention Span: Fair  Recall:  [employer] of Knowledge:  Good  Language:  Good  Akathisia:  No  Handed:  Right  AIMS (if indicated):     Assets:  Communication Skills Desire for Improvement Housing Intimacy Leisure Time Social Support Transportation  ADL's:  Intact  Cognition:  WNL  Sleep:        Demographic  Factors:  Caucasian, Low socioeconomic status and Unemployed  Loss Factors: Financial problems/change in socioeconomic status  Historical Factors: Family history of mental illness or substance abuse  Risk Reduction Factors:   Sense of responsibility to family, Religious beliefs about death, Living with another person, especially a relative and Positive social support  Continued Clinical Symptoms:  Severe Anxiety and/or Agitation Chronic Pain  Cognitive Features That Contribute To Risk:  Polarized thinking and Thought constriction (tunnel vision)    Suicide Risk:  Mild:  Suicidal ideation of limited frequency, intensity, duration, and specificity.  There are no identifiable plans, no associated intent, mild dysphoria and related symptoms, good self-control (both objective and subjective assessment), few other risk factors, and identifiable protective factors, including available and accessible social support.    Plan Of Care/Follow-up recommendations:  Patient does not appear to be high risk to her self or others at this time.  We will resend recurrent IVC.  Attempted to contact her daughter, brother, sister and husband multiple times, to provide collateral information but were unable to reach at this time.  Patient was educated about her medications and their interactions with alcohol.  Voices that she does not plan to drink "ever again".  She was able to outline a safety plan including contacting her social supports, utilizing coping skills such as "walking away, calling my friends, watching television", she will be given information about the crisis hotline.  She voiced that she would call 911 return to the hospital in the event that she felt unsafe.  Denies having access to firearms   Disposition: Plan for discharge home   Luciano Cutter, DO 06/06/2018, 1:03 PM

## 2018-06-06 NOTE — ED Provider Notes (Signed)
[facility] Emergency Department Provider Note    First MD Initiated Contact with Patient 06/06/18 [phone]     (approximate)  I have reviewed the triage vital signs and the nursing notes.   HISTORY  Chief Complaint Psychiatric Evaluation   HPI Beth Parker is a 63 y.o. female with below list of previous medical conditions presents to the emergency department involuntary committed by her daughter for suicidal ideation.  Patient's daughter states that her mother has been drinking alcohol and taking pills and stated that she would drive a car [place]".  Patient denies any suicidal ideation.  Patient denies any homicidal ideation.  Patient does admit to EtOH ingestion and states that she take her prescription medications        Past Medical History:  Diagnosis Date  . Anxiety   . Arthritis    knee and back, h/o chronic L1 fracture  . Back pain    h/o L1 fracture  . Depression   . GERD (gastroesophageal reflux disease)   . High cholesterol   . Hypertension   . Migraine   . Rib fracture    R 5th, 01/2011    Patient Active Problem List   Diagnosis Date Noted  . Routine general medical examination at a health care facility 10/16/2011  . Hyperglycemia 10/16/2011  . Shoulder pain 05/17/2011  . Migraine 04/25/2011  . Fracture of rib of right side 02/07/2011  . 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 Breast mass, right 06/13/2010  . BACK PAIN, CHRONIC 12/31/2006  . HYPERCHOLESTEROLEMIA 07/31/2006  . STATE, SYMPTOMATIC MENOPAUSE/FEM CLIMACTERIC 06/27/2006  . ANXIETY 06/26/2006  . DEPRESSION 06/26/2006  . HYPERTENSION 06/26/2006  . HEMORRHOIDS 06/26/2006  . GERD 06/26/2006    Past Surgical History:  Procedure Laterality Date  . ABDOMINAL HYSTERECTOMY  1986   Right ovary remains  . ANKLE SURGERY  2005  . BREAST BIOPSY  08-23-2010   right (benign)  . CERVICAL DISC SURGERY  1994  . CESAREAN SECTION    . COLONOSCOPY N/A 05/30/2014   Procedure:  COLONOSCOPY;  Surgeon: Wallace CullensPaul Y Oh, MD;  [facility];  Service: Gastroenterology;  Laterality: N/A;  . ESOPHAGOGASTRODUODENOSCOPY N/A 05/30/2014   Procedure: ESOPHAGOGASTRODUODENOSCOPY (EGD);  Surgeon: Wallace CullensPaul Y Oh, MD;  [facility];  Service: Gastroenterology;  Laterality: N/A;  . HEMORRHOID SURGERY     internal and external  . TUBAL LIGATION    . WRIST SURGERY     right (ganglion cyst)    Prior to Admission medications   Medication Sig Start Date End Date Taking? Authorizing Provider  ALPRAZolam Prudy Feeler(XANAX) 1 MG tablet Take 1 mg by mouth 3 (three) times daily.   10/03/10   Arta SilenceSchaller, Robert N, MD  atorvastatin (LIPITOR) 40 MG tablet Take 1 tablet (40 mg total) by mouth daily. 10/11/11   Joaquim Namuncan, Graham S, MD  budesonide-formoterol [facility](SYMBICORT) 160-4.5 MCG/ACT inhaler Inhale 2 puffs into the lungs 2 (two) times daily.    [provider]  Docusate Calcium (STOOL SOFTENER PO) Take 1 capsule by mouth 2 (two) times daily.    [provider]  etodolac (LODINE) 400 MG tablet Take 400 mg by mouth 2 (two) times daily.    [provider]  furosemide (LASIX) 20 MG tablet Take 20 mg by mouth daily.    [provider]  HYDROcodone-acetaminophen (LORTAB) 7.5-500 MG per tablet Take 1 tablet by mouth every 4 (four) hours as needed for pain.     [provider]  hydrOXYzine (ATARAX/VISTARIL) 25 MG tablet Take 25 mg by mouth at bedtime.    [provider]  lisinopril (PRINIVIL,ZESTRIL) 20 MG tablet Take 20 mg by mouth daily.    [provider]  meloxicam (MOBIC) 15 MG tablet Take 15 mg by mouth daily.    [provider]  methylPREDNISolone (MEDROL DOSEPAK) 4 MG TBPK tablet Take Tapered dose as directed 06/19/15   Joni Reining, PA-C  metoCLOPramide (REGLAN) 10 MG tablet Take 1 tablet (10 mg total) by mouth 3 (three) times daily as needed (for nausea). 03/22/12   Lowell Bouton, PA-C  metoprolol (LOPRESSOR) 50 MG tablet Take 50  mg by mouth 2 (two) times daily.    [provider]  metoprolol succinate (TOPROL-XL) 100 MG 24 hr tablet Take 1 tablet (100 mg total) by mouth daily. Take with or immediately following a meal. 10/15/11   Joaquim Nam, MD  mirtazapine (REMERON) 15 MG tablet Take 15 mg by mouth at bedtime.    [provider]  omeprazole (PRILOSEC) 40 MG capsule Take 40 mg by mouth daily.    [provider]  ondansetron (ZOFRAN) 4 MG tablet Take 4 mg by mouth every 8 (eight) hours as needed for nausea or vomiting.    [provider]  pantoprazole (PROTONIX) 40 MG tablet Take 1 tablet (40 mg total) by mouth 2 (two) times daily. 05/06/12   Joaquim Nam, MD  PARoxetine (PAXIL) 10 MG tablet Take 10 mg by mouth daily.    [provider]  quinapril (ACCUPRIL) 20 MG tablet Take 1 tablet (20 mg total) by mouth daily. 10/15/11   Joaquim Nam, MD  simvastatin (ZOCOR) 40 MG tablet Take 40 mg by mouth daily.    [provider]  tiotropium (SPIRIVA) 18 MCG inhalation capsule Place 18 mcg into inhaler and inhale daily.    [provider]  traZODone (DESYREL) 150 MG tablet Take 150 mg by mouth at bedtime.    [provider]    Allergies Fluphenazine  Family History  Problem Relation Age of Onset  . Hypertension Mother   . Alzheimer's disease Mother   . Cancer Father        prostate  . Mental illness Father   . Mental illness Brother   . Mental illness Sister   . Breast cancer Sister   . Cancer Sister        breast cancer  . Colon cancer Neg Hx     Social History Social History   Tobacco Use  . Smoking status: Former Games developer  . Smokeless tobacco: Never Used  . Tobacco comment: quit over 8 years ago  Substance Use Topics  . Alcohol use: No  . Drug use: No    Review of Systems Constitutional: No fever/chills Eyes: No visual changes. ENT: No sore throat. Cardiovascular: Denies chest pain. Respiratory: Denies shortness of  breath. Gastrointestinal: No abdominal pain.  No nausea, no vomiting.  No diarrhea.  No constipation. Genitourinary: Negative for dysuria. Musculoskeletal: Negative for neck pain.  Negative for back pain. Integumentary: Negative for rash. Neurological: Negative for headaches, focal weakness or numbness. Psychiatric:  Positive for EtOH ingestion.  Reported suicidal ideation which patient denies   ____________________________________________   PHYSICAL EXAM:  VITAL SIGNS: ED Triage Vitals  Enc Vitals Group     BP 06/06/18 0246 (!) 105/50     Pulse Rate 06/06/18 0246 89     Resp 06/06/18 0246 18     Temp  06/06/18 0246 98.4 F (36.9 C)     Temp Source 06/06/18 0246 Oral     SpO2 06/06/18 0246 94 %     Weight 06/06/18 0247 90.7 kg (200 lb)     Height 06/06/18 0247 1.6 m (5\' 3" )     Head Circumference --      Peak Flow --      Pain Score 06/06/18 0247 8     Pain Loc --      Pain Edu? --      Excl. in GC? --     Constitutional: Alert and oriented. Well appearing and in no acute distress. Eyes: Conjunctivae are normal. Head: Atraumatic. Mouth/Throat: Mucous membranes are moist.  Oropharynx non-erythematous. Neck: No stridor Cardiovascular: Normal rate, regular rhythm. Good peripheral circulation. Grossly normal heart sounds. Respiratory: Normal respiratory effort.  No retractions. No audible wheezing. Gastrointestinal: Soft and nontender. No distention.  Musculoskeletal: No lower extremity tenderness nor edema. No gross deformities of extremities. Neurologic:  Normal speech and language. No gross focal neurologic deficits are appreciated.  Skin:  Skin is warm, dry and intact. No rash noted. Psychiatric: Mood and affect are normal. Speech and behavior are normal.  ____________________________________________   LABS (all labs ordered are listed, but only abnormal results are displayed)  Labs Reviewed  COMPREHENSIVE METABOLIC PANEL - Abnormal; Notable for the following  components:      Result Value   Potassium 3.3 (*)    Chloride 97 (*)    Glucose, Bld 117 (*)    BUN 32 (*)    Creatinine, Ser 1.58 (*)    GFR calc non Af Amer 35 (*)    GFR calc Af Amer 41 (*)    All other components within normal limits  ETHANOL - Abnormal; Notable for the following components:   Alcohol, Ethyl (B) 82 (*)    All other components within normal limits  SALICYLATE LEVEL  ACETAMINOPHEN LEVEL  CBC  URINE DRUG SCREEN, QUALITATIVE ([facility] ONLY)    __________________ Procedures   ____________________________________________   INITIAL IMPRESSION / MDM / ASSESSMENT AND PLAN / ED COURSE  As part of my medical decision making, I reviewed the following data within the electronic MEDICAL RECORD NUMBER   63 year old female presented with above-stated history and physical exam secondary to involuntary commitment for suicidal ideation.  Patient denies these allegations.  Awaiting psychiatry consultation.  *MAARIA CONNERS was evaluated in Emergency Department on 06/06/2018 for the symptoms described in the history of present illness. She was evaluated in the context of the global COVID-19 pandemic, which necessitated consideration that the patient might be at risk for infection with the SARS-CoV-2 virus that causes COVID-19. Institutional protocols and algorithms that pertain to the evaluation of patients at risk for COVID-19 are in a state of rapid change based on information released by regulatory bodies including the CDC and federal and state organizations. These policies and algorithms were followed during the patient's care in the ED.*       ____________________________________________  FINAL CLINICAL IMPRESSION(S) / ED DIAGNOSES  Final diagnoses:  Alcoholic intoxication with complication (HCC)     MEDICATIONS GIVEN DURING THIS VISIT:  Medications - No data to display   ED Discharge Orders    None       Note:  This document was prepared using Dragon voice  recognition software and may include unintentional dictation errors.   Darci Current, MD 06/06/18 [phone]

## 2018-06-06 NOTE — BH Assessment (Signed)
Per discussion with Psych MD (Dr. Pasty Arch) writer made several attempts to contact patient's support system to verify they do not have concerns for the patient's safety. Patient denies SI/HI and AV/H.  Writer called and left a HIPPA Compliant messages with family, requesting a return phone call. Patient was allowed to use her cell phone to provide the writer with the phone numbers.  Daughter French Ana [phone])  Brother Hessie Diener [phone])  Sister [phone])  Writer attempted to contact husband but he do not have a phone.

## 2018-06-06 NOTE — ED Provider Notes (Addendum)
The patient has been evaluated at bedside by Dr. Katrinka Blazing, psychiatry.  Patient is clinically stable.  Not felt to be a danger to self or others.  No SI or Hi.  No indication for inpatient psychiatric admission at this time.  Appropriate for continued outpatient therapy.    Upon discharge patient was standing at the nursing desk and then sat down into a wheelchair that slid out from under her.  She landed on her bottom.  Did not hit her head.  There is no LOC.  She complains of mild low back pain.  Patient was able to stand and ambulate without any discomfort or distress after the fall.  Will order x-ray.  See me x-ray is negative patient stable for outpatient follow-up.   Willy Eddy, MD 06/06/18 1504

## 2018-06-06 NOTE — ED Notes (Signed)
Pt to the er for IVC. Pt daughter took out papers when the pt had called her and stated she was going to drive her car and hurt herself. Pt has been drinking and does take muscle relaxer, pain medications for a broken elbow and xanax. Pt does admit to drinking etoh today and fighting with her husband. Pt states she called her daughter because she and her husband had been fighting all day and she wanted her daughter to come and pick her up but her daughter had been drinking so she was not able to. Pt admits to stating she might kill her husband or he might kill her but denies any thoughts of SI.

## 2018-06-06 NOTE — ED Notes (Signed)
Pt asking for pain meds for her elbow. Advised pt due to her etoh level we would only be able to give her tylenol or ibuprofen. Pt declined.

## 2018-06-06 NOTE — ED Triage Notes (Signed)
Pt is here with IVC papers and [place] sherriff dept for SI per paperwork. When asked if pt is suicidal she states "not really". Pt states she has been consuming ETOH today.

## 2018-06-06 NOTE — ED Notes (Signed)
Asked pt again to provide urine specimen.

## 2018-06-06 NOTE — ED Notes (Signed)
Pt given lunch tray.

## 2018-06-06 NOTE — ED Notes (Signed)
Pt changed into behavorial clothing by this tech and Melody EDT , the following belongings were placed into bag...Marland KitchenMarland KitchenMarland Kitchen  1 pair of white shoes,  1 pair of jeans, 1 pair of black underwear, 1 green shirt with pink writing,  1 silver colored ring with purple stone, 1 silver colored ring with white stone, 1 yellow colored necklace with jesus anchor on it, 1 silver colored ring with white stones, 1 silver colored ring with blue and white stones, 1 silver colored ring with purple heart stones, 1 yellow colored ring with praying hands on it, 1 yellow colored ring with the word "GOD" on it, 1 yellow colored ring with the word "Jesus" on it, 1 yellow colored ring with 5 red colored hearts on it, 1 yellow colored ring with 1 white ball in it, 1 silver colored ring with and dangling cross on it, 1 pair of black fish looking earrings, 1 pair of angel earrings, 1 pair of white "I love Jesus" hair bows, 1 ten dollar bill, 2 1 dollar bills,  1 quarter, 2 dimes, 3 pennies.........the patient does not wish to have money locked up by security stating "it's not enough to amount to nothing"
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
## 2018-06-10 ENCOUNTER — Telehealth: Payer: Self-pay | Admitting: General Practice

## 2018-06-10 NOTE — Telephone Encounter (Signed)
Removed Dr. Para March from pt's pcp. He hasn't seen patient since 2013. I also called patient and left message if she wants to continue care with Dr. Para March she needs to reestablish care with him.

## 2018-08-25 DIAGNOSIS — Z79899 Other long term (current) drug therapy: Secondary | ICD-10-CM | POA: Insufficient documentation

## 2018-09-21 ENCOUNTER — Ambulatory Visit: Payer: Self-pay | Admitting: *Deleted
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
## 2018-09-24 NOTE — Telephone Encounter (Signed)
It is fine to make her an appointment, but I'd advise her to go to the urgent care as it will not be in the next week and she needs to be evaluated for the rectal bleeding.

## 2018-09-24 NOTE — Telephone Encounter (Signed)
Christian: I called and left a message for patient letting her know the response from Dr.J.  Unsure if you need to do anything further with this.

## 2018-09-25 NOTE — Telephone Encounter (Signed)
I was unable to get pt on the phone but I have scheduled her for 10/08/2018. Will mail reminder and continue to try to call pt to make sure this appt works.

## 2018-10-08 ENCOUNTER — Other Ambulatory Visit: Payer: Self-pay

## 2018-10-08 ENCOUNTER — Ambulatory Visit (INDEPENDENT_AMBULATORY_CARE_PROVIDER_SITE_OTHER): Payer: Self-pay | Admitting: Family Medicine

## 2018-10-08 ENCOUNTER — Encounter: Payer: Self-pay | Admitting: Family Medicine

## 2018-10-08 VITALS — BP 138/82 | HR 61 | Temp 98.1°F | Ht 63.39 in | Wt 205.4 lb

## 2018-10-08 DIAGNOSIS — Z5989 Other problems related to housing and economic circumstances: Secondary | ICD-10-CM

## 2018-10-08 DIAGNOSIS — Z598 Other problems related to housing and economic circumstances: Secondary | ICD-10-CM

## 2018-10-08 NOTE — Patient Instructions (Signed)
[facility], [address] [place], [place] [address] Phone: [phone]

## 2018-10-08 NOTE — Progress Notes (Signed)
Patient presented today to establish care, however has no insurance and did not want to be seen- will get her number for the [facility] and also put in CCM referral to see if anyone can help. Not seen today.

## 2018-12-16 ENCOUNTER — Ambulatory Visit: Payer: Self-pay

## 2018-12-16 NOTE — Telephone Encounter (Signed)
BP readings: 159/92,135/92,171/104 (Thur),171/81- patient has elevation today and she states she does not have symptoms now. Due to the hour of the call and the 3 day time limit- the office is closed for 5 days due to holiday- advised patient to go to [facility] for possible medication adjustment and call on Monday to schedule the NP appointment.  Reason for Disposition . Systolic BP  >= 826 OR Diastolic >= 415  Answer Assessment - Initial Assessment Questions 1. BLOOD PRESSURE: "What is the blood pressure?" "Did you take at least two measurements 5 minutes apart?"     178/103  P 74 2. ONSET: "When did you take your blood pressure?"     4:30 3. HOW: "How did you obtain the blood pressure?" (e.g., visiting nurse, automatic home BP monitor)     Automatic cuff 4. HISTORY: "Do you have a history of high blood pressure?"     yes 5. MEDICATIONS: "Are you taking any medications for blood pressure?" "Have you missed any doses recently?"     Yes- no missed doses 6. OTHER SYMPTOMS: "Do you have any symptoms?" (e.g., headache, chest pain, blurred vision, difficulty breathing, weakness)     no 7. PREGNANCY: "Is there any chance you are pregnant?" "When was your last menstrual period?"     n/a  Protocols used: HIGH BLOOD PRESSURE-A-AH

## 2018-12-16 NOTE — Telephone Encounter (Signed)
Attempted to call Pt.  Left message for return call.

## 2018-12-25 ENCOUNTER — Encounter: Payer: Self-pay | Admitting: Family Medicine

## 2018-12-25 ENCOUNTER — Ambulatory Visit (INDEPENDENT_AMBULATORY_CARE_PROVIDER_SITE_OTHER): Payer: Medicaid Other | Admitting: Family Medicine

## 2018-12-25 ENCOUNTER — Other Ambulatory Visit: Payer: Self-pay

## 2018-12-25 VITALS — BP 124/79 | HR 61 | Temp 98.1°F | Ht 63.5 in | Wt 196.0 lb

## 2018-12-25 DIAGNOSIS — E7800 Pure hypercholesterolemia, unspecified: Secondary | ICD-10-CM | POA: Diagnosis not present

## 2018-12-25 DIAGNOSIS — Z7689 Persons encountering health services in other specified circumstances: Secondary | ICD-10-CM | POA: Diagnosis not present

## 2018-12-25 DIAGNOSIS — F339 Major depressive disorder, recurrent, unspecified: Secondary | ICD-10-CM

## 2018-12-25 DIAGNOSIS — I10 Essential (primary) hypertension: Secondary | ICD-10-CM

## 2018-12-25 DIAGNOSIS — K219 Gastro-esophageal reflux disease without esophagitis: Secondary | ICD-10-CM | POA: Diagnosis not present

## 2018-12-25 DIAGNOSIS — F419 Anxiety disorder, unspecified: Secondary | ICD-10-CM

## 2018-12-25 MED ORDER — FLUOXETINE HCL 20 MG PO TABS: 20.0000 mg | ORAL_TABLET | Freq: Every day | ORAL | 0 refills | Status: DC
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
## 2018-12-26 LAB — COMPREHENSIVE METABOLIC PANEL
ALT: 18 IU/L (ref 0–32)
AST: 14 IU/L (ref 0–40)
Albumin/Globulin Ratio: 1.4 (ref 1.2–2.2)
Albumin: 4.2 g/dL (ref 3.8–4.8)
Alkaline Phosphatase: 81 IU/L (ref 39–117)
BUN/Creatinine Ratio: 21 (ref 12–28)
BUN: 16 mg/dL (ref 8–27)
Bilirubin Total: 0.3 mg/dL (ref 0.0–1.2)
CO2: 21 mmol/L (ref 20–29)
Calcium: 9.2 mg/dL (ref 8.7–10.3)
Chloride: 104 mmol/L (ref 96–106)
Creatinine, Ser: 0.77 mg/dL (ref 0.57–1.00)
GFR calc Af Amer: 96 mL/min/{1.73_m2} (ref >59)
GFR calc non Af Amer: 84 mL/min/{1.73_m2} (ref >59)
Globulin, Total: 2.9 g/dL (ref 1.5–4.5)
Glucose: 71 mg/dL (ref 65–99)
Potassium: 4.2 mmol/L (ref 3.5–5.2)
Sodium: 140 mmol/L (ref 134–144)
Total Protein: 7.1 g/dL (ref 6.0–8.5)

## 2018-12-26 LAB — LIPID PANEL W/O CHOL/HDL RATIO
Cholesterol, Total: 205 mg/dL — ABNORMAL HIGH (ref 100–199)
HDL: 45 mg/dL (ref >39)
LDL Chol Calc (NIH): 139 mg/dL — ABNORMAL HIGH (ref 0–99)
Triglycerides: 116 mg/dL (ref 0–149)
VLDL Cholesterol Cal: 21 mg/dL (ref 5–40)

## 2018-12-29 ENCOUNTER — Telehealth: Payer: Self-pay | Admitting: Family Medicine

## 2018-12-29 NOTE — Telephone Encounter (Signed)
Opened in error

## 2018-12-30 ENCOUNTER — Telehealth: Payer: Self-pay | Admitting: Family Medicine

## 2018-12-30 NOTE — Assessment & Plan Note (Signed)
Stable and under good control, continue current regimen 

## 2018-12-30 NOTE — Assessment & Plan Note (Signed)
See anxiety plan 

## 2018-12-30 NOTE — Assessment & Plan Note (Signed)
Patient became quite agitated when alternatives/safety concerns discussed regarding restarting BZDs and stopped communicating effectively during visit. Numerous options given, including trial of prozac, referral to Psychiatry, second opinion from another PCP but pt continually shrugs her shoulders and looks to the other direction. Discussed that I will send over prozac and if she decides to try it she should return in 1 month for recheck, and that I would initiate a referral to specialist for another opinion on management and counseling and she could decide whether to accept appt.

## 2018-12-30 NOTE — Assessment & Plan Note (Signed)
Due for labs. Not currently on statin per patient. Will restart if needed. Lifestyle habits reviewed

## 2018-12-30 NOTE — Telephone Encounter (Signed)
° ° °  Called pt regarding Insurance account manager for [employer]. [place] [employer] ??[email or web address]   ??[phone]

## 2019-01-04 NOTE — Telephone Encounter (Signed)
Pt returned my call and discussed need for Psychiatrist that takes [employer], stated that she used to see Dr. Thurmond Butts (located near hospital) for Psychiatry but he has retired.   Gave her the information for [employer] of the [place] as well as Engineer, materials. Noted that [facility] sent referral to Psychiatry on 12/4 but I don't believe she has heard back from anyone yet. (not able to view that on chart)  Separately, she asked if there was any way she could still see Dr. Jeananne Rama and I told her that he had retired, she asked if she could now see Dr. Wynetta Emery and I told her I would follow up with the Practice manager to see if that was a possibility.

## 2019-01-12 ENCOUNTER — Telehealth: Payer: Self-pay

## 2019-01-12 NOTE — Telephone Encounter (Signed)
Copied from [employer] [id number]. Topic: General - Other >> Jan 12, 2019  2:12 PM Greggory Keen D wrote: Reason for CRM: pt went to Urgent care this morning for throwing up, diarrhea, throat pain, cough.  They did not do a covid test.  They told her she needed to call her primary and get a referral to GI doctor.  CB#  [phone]   Routing to provider.

## 2019-01-12 NOTE — Telephone Encounter (Signed)
Needs appt

## 2019-01-13 NOTE — Telephone Encounter (Signed)
Patient scheduled for tomorrow. OK per Apolonio Schneiders.

## 2019-01-13 NOTE — Telephone Encounter (Signed)
Spoke with patient and she would like to continue to see Merrie Roof, PA-C.

## 2019-01-14 ENCOUNTER — Encounter: Payer: Self-pay | Admitting: Family Medicine

## 2019-01-14 ENCOUNTER — Other Ambulatory Visit: Payer: Self-pay

## 2019-01-14 ENCOUNTER — Ambulatory Visit (INDEPENDENT_AMBULATORY_CARE_PROVIDER_SITE_OTHER): Payer: Medicaid Other | Admitting: Family Medicine

## 2019-01-14 VITALS — BP 141/81 | HR 72 | Wt 185.0 lb

## 2019-01-14 DIAGNOSIS — R05 Cough: Secondary | ICD-10-CM

## 2019-01-14 DIAGNOSIS — R112 Nausea with vomiting, unspecified: Secondary | ICD-10-CM

## 2019-01-14 DIAGNOSIS — R197 Diarrhea, unspecified: Secondary | ICD-10-CM

## 2019-01-14 DIAGNOSIS — K219 Gastro-esophageal reflux disease without esophagitis: Secondary | ICD-10-CM | POA: Diagnosis not present

## 2019-01-14 DIAGNOSIS — R059 Cough, unspecified: Secondary | ICD-10-CM

## 2019-01-14 MED ORDER — AMOXICILLIN-POT CLAVULANATE 875-125 MG PO TABS: 1.0000 | ORAL_TABLET | Freq: Two times a day (BID) | ORAL | 0 refills | Status: DC

## 2019-01-14 NOTE — Progress Notes (Signed)
BP (!) 141/81   Pulse 72   Wt 185 lb (83.9 kg)   BMI 32.26 kg/m    Subjective:    Patient ID: Beth Parker, female    DOB: 1955-05-09, 63 y.o.   MRN: [id number]  HPI: Beth Parker is a 63 y.o. female  Chief Complaint  Patient presents with  . Referral    for GI doctor  . Diarrhea    symptoms started about 2 weeks ago  . Cough  . Sore Throat  . Generalized Body Aches    . This visit was completed via telephone due to the restrictions of the COVID-19 pandemic. All issues as above were discussed and addressed. Physical exam was done as above through visual confirmation on telephone. If it was felt that the patient should be evaluated in the office, they were directed there. The patient verbally consented to this visit. . Location of the patient: home . Location of the provider: work . Those involved with this call:  . Provider: Merrie Roof, PA-C . CMA: Lesle Chris, [place] . Front Desk/Registration: Jill Side  . Time spent on call: 25 minutes on the phone discussing health concerns. 10 minutes total spent in review of patient's record and preparation of their chart. I verified patient identity using two factors (patient name and date of birth). Patient consents verbally to being seen via telemedicine visit today.   About 2 weeks of N/V/D, went to walk in clinic and given zofran which has resolved the vomiting. Was told she needed a referral to GI with concern for gastric ulcer. Takes prilosec daily. Has also been having sweats, fevers, chills, congestion, sore throat, and cough with some wheezing and mild SOB. Did not get COVID testing during UC visit. Has been trying OTC cold medicines without relief. Denies known sick contacts, recent travel, CP, significant SOB, dizziness, falls, melena, hematemesis, abdominal pain (just feeling nauseated).   Relevant past medical, surgical, family and social history reviewed and updated as indicated. Interim medical history since our last  visit reviewed. Allergies and medications reviewed and updated.  Review of Systems  Per HPI unless specifically indicated above     Objective:    BP (!) 141/81   Pulse 72   Wt 185 lb (83.9 kg)   BMI 32.26 kg/m   Wt Readings from Last 3 Encounters:  01/14/19 185 lb (83.9 kg)  12/25/18 196 lb (88.9 kg)  10/08/18 205 lb 6 oz (93.2 kg)    Physical Exam  Unable to perform PE due to patient lack of access to video technology for today's visit.   Results for orders placed or performed in visit on 12/25/18  Lipid Panel w/o Chol/HDL Ratio out  Result Value Ref Range   Cholesterol, Total 205 (H) 100 - 199 mg/dL   Triglycerides 116 0 - 149 mg/dL   HDL 45 >39 mg/dL   VLDL Cholesterol Cal 21 5 - 40 mg/dL   LDL Chol Calc (NIH) 139 (H) 0 - 99 mg/dL  Comprehensive metabolic panel  Result Value Ref Range   Glucose 71 65 - 99 mg/dL   BUN 16 8 - 27 mg/dL   Creatinine, Ser 0.77 0.57 - 1.00 mg/dL   GFR calc non Af Amer 84 >59 mL/min/1.73   GFR calc Af Amer 96 >59 mL/min/1.73   BUN/Creatinine Ratio 21 12 - 28   Sodium 140 134 - 144 mmol/L   Potassium 4.2 3.5 - 5.2 mmol/L   Chloride 104 96 - 106  mmol/L   CO2 21 20 - 29 mmol/L   Calcium 9.2 8.7 - 10.3 mg/dL   Total Protein 7.1 6.0 - 8.5 g/dL   Albumin 4.2 3.8 - 4.8 g/dL   Globulin, Total 2.9 1.5 - 4.5 g/dL   Albumin/Globulin Ratio 1.4 1.2 - 2.2   Bilirubin Total 0.3 0.0 - 1.2 mg/dL   Alkaline Phosphatase 81 39 - 117 IU/L   AST 14 0 - 40 IU/L   ALT 18 0 - 32 IU/L      Assessment & Plan:   Problem List Items Addressed This Visit      Digestive   GERD    Continue omeprazole, diet modifications reviewed      Relevant Orders   Ambulatory referral to Gastroenterology    Other Visit Diagnoses    Cough    -  Primary   Will refer for COVID testing given sxs and tx with augmentin due to duration and worsening sxs. Supportive care and return precautions reviewed   Relevant Orders   Novel Coronavirus, NAA ([facility])   Nausea  vomiting and diarrhea       Tolerating PO. Continue zofran prn, push fluids, BRAT diet. GI referral placed per patient request for further eval   Relevant Orders   Novel Coronavirus, NAA ([facility])   Ambulatory referral to Gastroenterology       Follow up plan: Return if symptoms worsen or fail to improve.

## 2019-01-20 ENCOUNTER — Telehealth: Payer: Self-pay | Admitting: Family Medicine

## 2019-01-20 ENCOUNTER — Encounter: Payer: Self-pay | Admitting: *Deleted

## 2019-01-20 NOTE — Telephone Encounter (Signed)
Copied from [place]. Topic: Quick Communication - Rx Refill/Question >> Jan 20, 2019 11:20 AM Rainey Pines A wrote: Medication: amoxicillin-clavulanate (AUGMENTIN) 875-125 MG tablet, medication for nausea  Has the patient contacted their pharmacy? Yes (Agent: If no, request that the patient contact the pharmacy for the refill.) (Agent: If yes, when and what did the pharmacy advise?)Contact PCP  Preferred Pharmacy (with phone number or street name): [place], [place]  Phone:  [phone] Fax:  [phone]     Agent: Please be advised that RX refills may take up to 3 business days. We ask that you follow-up with your pharmacy.

## 2019-01-20 NOTE — Assessment & Plan Note (Signed)
Continue omeprazole, diet modifications reviewed

## 2019-01-20 NOTE — Telephone Encounter (Signed)
Pt called about refill request for amoxicillin-clavulanate (AUGMENTIN) 875-125 MG tablet    And a medication for nausea / please advise asap

## 2019-01-20 NOTE — Telephone Encounter (Signed)
Requested medication (s) are due for refill today: no  Requested medication (s) are on the active medication list: yes  Last refill:  01/13/2019  Future visit scheduled: no  Notes to clinic:    Medication not assigned to a protocol, review manually     Requested Prescriptions  Pending Prescriptions Disp Refills   amoxicillin-clavulanate (AUGMENTIN) 875-125 MG tablet 14 tablet 0    Sig: Take 1 tablet by mouth 2 (two) times daily.      Off-Protocol Failed - 01/20/2019 11:32 AM      Failed - Medication not assigned to a protocol, review manually.      Passed - Valid encounter within last 12 months    Recent Outpatient Visits           6 days ago Cough   [facility] Merrie Roof [place], [place]   3 weeks ago Essential hypertension   [facility], [place], [place]   3 months ago Does not have health insurance   [facility], [place], [place]

## 2019-01-25 NOTE — Telephone Encounter (Signed)
Patient would like to know why this medication was denied. She is requesting a call back to discuss.

## 2019-01-25 NOTE — Telephone Encounter (Signed)
Pt says that she is requesting a refill because her infection still hasn't cleared up. Pt feels that another round of antibiotics will help.      Pharmacy:  [facility] - [place], [place] - [address] [place] Phone:  [phone]  Fax:  [phone]

## 2019-01-26 NOTE — Telephone Encounter (Addendum)
Husband calling to ask again for the abx and nausea medication.  The infection has not cleared up.  He states pt has appt with a GI dr but it is not until Mar 04, 2019

## 2019-01-26 NOTE — Telephone Encounter (Signed)
Pt stated that she felt like the Covid test was a waste of money and she will not be getting it done. Advised the pt that the order was placed 12/24 and that it was recommended. Pt declined again. I then let pt know that her appt on 01/28/19 know that her appt will be over the phone.

## 2019-01-26 NOTE — Telephone Encounter (Signed)
Routing to provider. Can this be done or does the patient need to be seen again? Last visit was 01/14/19

## 2019-01-26 NOTE — Telephone Encounter (Signed)
Needs appt if not better to discuss current sxs, and I see she never did her COVID test so cannot be in person

## 2019-01-28 ENCOUNTER — Other Ambulatory Visit: Payer: Self-pay

## 2019-01-28 ENCOUNTER — Encounter: Payer: Self-pay | Admitting: Family Medicine

## 2019-01-28 ENCOUNTER — Ambulatory Visit (INDEPENDENT_AMBULATORY_CARE_PROVIDER_SITE_OTHER): Payer: Medicaid Other | Admitting: Family Medicine

## 2019-01-28 VITALS — BP 133/80 | HR 65 | Ht 63.5 in | Wt 196.0 lb

## 2019-01-28 DIAGNOSIS — J069 Acute upper respiratory infection, unspecified: Secondary | ICD-10-CM | POA: Diagnosis not present

## 2019-01-28 MED ORDER — AZITHROMYCIN 250 MG PO TABS: ORAL_TABLET | ORAL | 0 refills | Status: DC

## 2019-01-28 MED ORDER — GUAIFENESIN ER 600 MG PO TB12: 600.0000 mg | ORAL_TABLET | Freq: Two times a day (BID) | ORAL | 0 refills | Status: DC | PRN

## 2019-01-28 MED ORDER — ALBUTEROL SULFATE HFA 108 (90 BASE) MCG/ACT IN AERS: 2.0000 | INHALATION_SPRAY | Freq: Four times a day (QID) | RESPIRATORY_TRACT | 0 refills | Status: DC | PRN

## 2019-01-28 NOTE — Progress Notes (Signed)
BP 133/80   Pulse 65   Ht 5' 3.5" (1.613 m)   Wt 196 lb (88.9 kg)   BMI 34.18 kg/m    Subjective:    Patient ID: Beth Parker, female    DOB: 08-24-1955, 64 y.o.   MRN: [id number]  HPI: Beth Parker is a 64 y.o. female  Chief Complaint  Patient presents with  . Follow-up    antibiotic refill request  . Cough  . Sore Throat    . This visit was completed via telephone due to the restrictions of the COVID-19 pandemic. All issues as above were discussed and addressed. Physical exam was done as above through visual confirmation on telephone. If it was felt that the patient should be evaluated in the office, they were directed there. The patient verbally consented to this visit. . Location of the patient: home . Location of the provider: work . Those involved with this call:  . Provider: Merrie Roof, PA-C . CMA: Lesle Chris, [place] . Front Desk/Registration: Jill Side  . Time spent on call: 15 minutes on the phone discussing health concerns. 5 minutes total spent in review of patient's record and preparation of their chart. I verified patient identity using two factors (patient name and date of birth). Patient consents verbally to being seen via telemedicine visit today.   Doing some better overall, particularly with regard to her GI sxs, but still having sore throat, chest tightness, wheezing. Did not get COVID tested as she felt this was a waste of time and money but has been trying to quarantine at home in case this is what was causing her sxs. Denies significant SOB, CP, fevers. Completed a course of augmentin last week which she states did help some. Not taking anything OTC for sxs. No new sick contacts.   Relevant past medical, surgical, family and social history reviewed and updated as indicated. Interim medical history since our last visit reviewed. Allergies and medications reviewed and updated.  Review of Systems  Per HPI unless specifically indicated above       Objective:    BP 133/80   Pulse 65   Ht 5' 3.5" (1.613 m)   Wt 196 lb (88.9 kg)   BMI 34.18 kg/m   Wt Readings from Last 3 Encounters:  01/28/19 196 lb (88.9 kg)  01/14/19 185 lb (83.9 kg)  12/25/18 196 lb (88.9 kg)    Physical Exam  Unable to perform PE due to patient lack of access to video technology for visit.   Results for orders placed or performed in visit on 12/25/18  Lipid Panel w/o Chol/HDL Ratio out  Result Value Ref Range   Cholesterol, Total 205 (H) 100 - 199 mg/dL   Triglycerides 116 0 - 149 mg/dL   HDL 45 >39 mg/dL   VLDL Cholesterol Cal 21 5 - 40 mg/dL   LDL Chol Calc (NIH) 139 (H) 0 - 99 mg/dL  Comprehensive metabolic panel  Result Value Ref Range   Glucose 71 65 - 99 mg/dL   BUN 16 8 - 27 mg/dL   Creatinine, Ser 0.77 0.57 - 1.00 mg/dL   GFR calc non Af Amer 84 >59 mL/min/1.73   GFR calc Af Amer 96 >59 mL/min/1.73   BUN/Creatinine Ratio 21 12 - 28   Sodium 140 134 - 144 mmol/L   Potassium 4.2 3.5 - 5.2 mmol/L   Chloride 104 96 - 106 mmol/L   CO2 21 20 - 29 mmol/L   Calcium 9.2  8.7 - 10.3 mg/dL   Total Protein 7.1 6.0 - 8.5 g/dL   Albumin 4.2 3.8 - 4.8 g/dL   Globulin, Total 2.9 1.5 - 4.5 g/dL   Albumin/Globulin Ratio 1.4 1.2 - 2.2   Bilirubin Total 0.3 0.0 - 1.2 mg/dL   Alkaline Phosphatase 81 39 - 117 IU/L   AST 14 0 - 40 IU/L   ALT 18 0 - 32 IU/L      Assessment & Plan:   Problem List Items Addressed This Visit    None    Visit Diagnoses    Upper respiratory tract infection, unspecified type    -  Primary   Possibly COVID related but pt refuses testing. Will start zpak, mucinex and albuterol inhaler prn. Supportive care and return precautions given.    Relevant Medications   azithromycin (ZITHROMAX) 250 MG tablet       Follow up plan: Return if symptoms worsen or fail to improve.

## 2019-03-04 ENCOUNTER — Encounter: Payer: Self-pay | Admitting: Gastroenterology

## 2019-03-04 ENCOUNTER — Ambulatory Visit: Payer: Self-pay | Admitting: Gastroenterology

## 2019-03-18 ENCOUNTER — Ambulatory Visit: Payer: Self-pay | Admitting: Gastroenterology

## 2019-04-04 ENCOUNTER — Emergency Department: Payer: Medicaid Other

## 2019-04-04 ENCOUNTER — Other Ambulatory Visit: Payer: Self-pay

## 2019-04-04 ENCOUNTER — Emergency Department
Admission: EM | Admit: 2019-04-04 | Discharge: 2019-04-04 | Disposition: A | Discharge status: Home / Self Care | Payer: Medicaid Other | Attending: Emergency Medicine | Admitting: Emergency Medicine

## 2019-04-04 ENCOUNTER — Encounter: Payer: Self-pay | Admitting: Emergency Medicine

## 2019-04-04 DIAGNOSIS — M545 Low back pain, unspecified: Secondary | ICD-10-CM

## 2019-04-04 DIAGNOSIS — Z79899 Other long term (current) drug therapy: Secondary | ICD-10-CM | POA: Diagnosis not present

## 2019-04-04 DIAGNOSIS — I10 Essential (primary) hypertension: Secondary | ICD-10-CM | POA: Insufficient documentation

## 2019-04-04 DIAGNOSIS — Z87891 Personal history of nicotine dependence: Secondary | ICD-10-CM | POA: Insufficient documentation

## 2019-04-04 DIAGNOSIS — F419 Anxiety disorder, unspecified: Secondary | ICD-10-CM | POA: Diagnosis not present

## 2019-04-04 MED ORDER — HYDROCODONE-ACETAMINOPHEN 5-325 MG PO TABS
1.0000 | ORAL_TABLET | Freq: Once | ORAL | Status: AC
  Administered 2019-04-04: 1 via ORAL
  Filled 2019-04-04: qty 1

## 2019-04-04 MED ORDER — METHOCARBAMOL 500 MG PO TABS: 500.0000 mg | ORAL_TABLET | Freq: Four times a day (QID) | ORAL | 0 refills | Status: DC | PRN

## 2019-04-04 MED ORDER — HYDROCODONE-ACETAMINOPHEN 5-325 MG PO TABS: 1.0000 | ORAL_TABLET | Freq: Four times a day (QID) | ORAL | 0 refills | Status: DC | PRN

## 2019-04-04 MED ORDER — KETOROLAC TROMETHAMINE 30 MG/ML IJ SOLN
30.0000 mg | Freq: Once | INTRAMUSCULAR | Status: AC
  Administered 2019-04-04: 30 mg via INTRAMUSCULAR
  Filled 2019-04-04: qty 1

## 2019-04-04 MED ORDER — ALPRAZOLAM 0.25 MG PO TABS
0.5000 mg | ORAL_TABLET | Freq: Once | ORAL | Status: AC
  Administered 2019-04-04: 0.5 mg via ORAL
  Filled 2019-04-04: qty 2

## 2019-04-04 MED ORDER — METHOCARBAMOL 500 MG PO TABS
500.0000 mg | ORAL_TABLET | Freq: Once | ORAL | Status: AC
  Administered 2019-04-04: 500 mg via ORAL
  Filled 2019-04-04: qty 1

## 2019-04-04 NOTE — ED Notes (Addendum)
Pt c/o back pain that radiates into right leg when she straightens it. States thinks she may have pinched a nerve moving a TV. Pt also states her husband left her recently and she lives alone.

## 2019-04-04 NOTE — ED Triage Notes (Addendum)
Pt arrived via [facility] with reports of low back pain radiating down left and right leg since 7pm last night. Pt states she has been trying to use a heating pad with no relief.  Pt states went down on the floor because of the pain and was having difficulty walking due to the pain.  Pt states she has been trying to use her cane.  Pt has hx of back surgery over 20 years ago.   Pt states she took tylenol for the pain.

## 2019-04-04 NOTE — Discharge Instructions (Signed)
Follow-up with your primary care provider if any continued problems or concerns.  You were given an injection while in the ED which should help with your back pain.  Also a prescription for Robaxin a muscle relaxant and Vicodin was sent to your pharmacy.  Take these medications only as directed and also note that they may cause drowsiness which would increase your risk for falling.  Moist heat or ice to your back as needed for discomfort.

## 2019-04-04 NOTE — ED Provider Notes (Signed)
[facility] Emergency Department Provider Note   ____________________________________________   First MD Initiated Contact with Patient 04/04/19 0750     (approximate)  I have reviewed the triage vital signs and the nursing notes.   HISTORY  Chief Complaint Back Pain   HPI Beth Parker is a 64 y.o. female presents to the ED with complaint of low back pain with radiation into her right leg states that she has increased pain with ambulation.  She states that she fell in her kitchen at 7 PM last night.  She denies any head injury or loss of consciousness.  She states she has been using a heating pad with no relief.  Patient states that she has  had problems with her her back in the past.  She has had some difficulty walking due to the pain.  She also states that she has been using her cane.  She reports that she had an accident many years ago and was treated at [facility] for back pain.  Rates her pain as 10/10.      Past Medical History:  Diagnosis Date  . Anxiety   . Arthritis    knee and back, h/o chronic L1 fracture  . Back pain    h/o L1 fracture  . Depression   . Fractured elbow   . GERD (gastroesophageal reflux disease)   . High cholesterol   . Hypertension   . Migraine   . Rib fracture    R 5th, 01/2011    Patient Active Problem List   Diagnosis Date Noted  . Hyperglycemia 10/16/2011  . Shoulder pain 05/17/2011  . Migraine 04/25/2011  . Fracture of rib of right side 02/07/2011  . 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 Breast mass, right 06/13/2010  . BACK PAIN, CHRONIC 12/31/2006  . HYPERCHOLESTEROLEMIA 07/31/2006  . STATE, SYMPTOMATIC MENOPAUSE/FEM CLIMACTERIC 06/27/2006  . Anxiety 06/26/2006  . Depression, recurrent (HCC) 06/26/2006  . HTN (hypertension) 06/26/2006  . HEMORRHOIDS 06/26/2006  . GERD 06/26/2006    Past Surgical History:  Procedure Laterality Date  . ABDOMINAL HYSTERECTOMY  1986   Right ovary remains  . ANKLE  SURGERY  2005  . BREAST BIOPSY  08-23-2010   right (benign)  . CERVICAL DISC SURGERY  1994  . CESAREAN SECTION    . COLONOSCOPY N/A 05/30/2014   Procedure: COLONOSCOPY;  Surgeon: Wallace Cullens, MD;  Location: [facility] ENDOSCOPY;  Service: Gastroenterology;  Laterality: N/A;  . ESOPHAGOGASTRODUODENOSCOPY N/A 05/30/2014   Procedure: ESOPHAGOGASTRODUODENOSCOPY (EGD);  Surgeon: Wallace Cullens, MD;  Location: [facility] ENDOSCOPY;  Service: Gastroenterology;  Laterality: N/A;  . HEMORRHOID SURGERY     internal and external  . TUBAL LIGATION    . WRIST SURGERY     right (ganglion cyst)    Prior to Admission medications   Medication Sig Start Date End Date Taking? Authorizing Provider  Acetaminophen (TYLENOL PO) Take by mouth as needed. 500 mg tablet. Taking 9 tablets a day    [provider]  albuterol (VENTOLIN HFA) 108 (90 Base) MCG/ACT inhaler Inhale 2 puffs into the lungs every 6 (six) hours as needed for wheezing or shortness of breath. 01/28/19   Particia Nearing, PA-C  amLODipine (NORVASC) 10 MG tablet Take 10 mg by mouth daily.    [provider]  Docusate Sodium (STOOL SOFTENER LAXATIVE PO) Take by mouth 2 (two) times daily.    [provider]  Doxylamine Succinate, Sleep, (SLEEP AID PO) Take by mouth daily.  [provider]  estradiol (ESTRACE) 1 MG tablet Take by mouth daily.  06/19/18   [provider]  FLUoxetine (PROZAC) 20 MG tablet Take 1 tablet (20 mg total) by mouth daily. 12/25/18   Particia Nearing, PA-C  HYDROcodone-acetaminophen (NORCO/VICODIN) 5-325 MG tablet Take 1 tablet by mouth every 6 (six) hours as needed for moderate pain. 04/04/19   Tommi Rumps, PA-C  lisinopril (ZESTRIL) 40 MG tablet Take 40 mg by mouth daily.    [provider]  meloxicam (MOBIC) 7.5 MG tablet Take 7.5 mg by mouth 2 (two) times daily.    [provider]  methocarbamol (ROBAXIN) 500 MG tablet Take 1 tablet (500 mg total) by mouth every 6 (six)  hours as needed for muscle spasms. 04/04/19   Tommi Rumps, PA-C  metoprolol succinate (TOPROL-XL) 100 MG 24 hr tablet Take 1 tablet (100 mg total) by mouth daily. Take with or immediately following a meal. 10/15/11   Joaquim Nam, MD  omeprazole (PRILOSEC) 40 MG capsule Take 40 mg by mouth daily.    [provider]  Vitamin D, Ergocalciferol, (DRISDOL) 1.25 MG (50000 UT) CAPS capsule TAKE ONE CAPSULE BY MOUTH ONCE A WEEK 06/26/18   [provider]    Allergies Fluphenazine  Family History  Problem Relation Age of Onset  . Hypertension Mother   . Alzheimer's disease Mother   . Cancer Father        prostate  . Mental illness Father   . Mental illness Sister   . Breast cancer Sister   . Cancer Sister        breast cancer  . Colon cancer Neg Hx     Social History Social History   Tobacco Use  . Smoking status: Former Games developer  . Smokeless tobacco: Never Used  . Tobacco comment: quit over 8 years ago  Substance Use Topics  . Alcohol use: No  . Drug use: No    Review of Systems Constitutional: No fever/chills ENT: No injury. Cardiovascular: Denies chest pain. Respiratory: Denies shortness of breath. Gastrointestinal: No abdominal pain.  No nausea, no vomiting.  Genitourinary: Negative for dysuria. Musculoskeletal: Positive for low back pain. Skin: Negative for rash. Neurological: Negative for headaches, focal weakness or numbness. Psychological: Positive anxiety/depression per daughter. ___________________________________________   PHYSICAL EXAM:  VITAL SIGNS: ED Triage Vitals  Enc Vitals Group     BP 04/04/19 0711 (!) 163/93     Pulse Rate 04/04/19 0711 (!) 59     Resp 04/04/19 0711 16     Temp 04/04/19 0711 97.7 F (36.5 C)     Temp Source 04/04/19 0711 Oral     SpO2 04/04/19 0711 99 %     Weight 04/04/19 0712 184 lb (83.5 kg)     Height 04/04/19 0712 5\' 4"  (1.626 m)     Head Circumference --      Peak Flow --      Pain Score 04/04/19  0721 10     Pain Loc --      Pain Edu? --      Excl. in GC? --    Constitutional: Alert and oriented. Well appearing and in no acute distress. Eyes: Conjunctivae are normal.  Head: Atraumatic. Nose: No trauma. Neck: No stridor.  No tenderness on palpation cervical spine posteriorly.  No restriction with range of motion. Cardiovascular: Normal rate, regular rhythm. Grossly normal heart sounds.  Good peripheral circulation. Respiratory: Normal respiratory effort.  No retractions. Lungs CTAB.  Gastrointestinal: Soft and nontender. No distention.  Bowel sounds normoactive x4 quadrants. Musculoskeletal: On examination of the lower back there is no gross deformity or skin discoloration.  There is no tenderness on palpation of the hips bilaterally.  Patient is able to move lower extremities without any difficulty.  Motor sensory function intact. Neurologic:  Normal speech and language. No gross focal neurologic deficits are appreciated. No gait instability. Skin:  Skin is warm, dry and intact. No rash noted. Psychiatric: Mood and affect are normal. Speech and behavior are normal.  ____________________________________________   LABS (all labs ordered are listed, but only abnormal results are displayed)  Labs Reviewed - No data to display  RADIOLOGY   Official radiology report(s): DG Lumbar Spine 2-3 Views  Result Date: 04/04/2019 CLINICAL DATA:  Pt arrived via [facility] with reports of low back pain radiating down left and right leg since 7pm last night. Pain mainly down the right leg. Pt states she has been trying to use a heating pad with new relief. Pt states went down on the floor because of the pain and was having difficulty walking due to the pain. EXAM: LUMBAR SPINE - 2-3 VIEW COMPARISON:  06/06/2018 FINDINGS: Mild chronic compression fracture of L1 is stable from the prior exam. No other fractures. Straightened lumbar lordosis.  No spondylolisthesis. Moderate loss of disc height at L4-L5.  Remaining lumbar discs are well preserved in height. Small endplate osteophytes noted from L2 through the upper endplate of S1, and in the lower thoracic spine, stable. Scattered inferior aortic atherosclerotic calcifications. Soft tissues otherwise unremarkable. IMPRESSION: 1. No acute finding.  No change from the prior radiographs. 2. Stable mild compression fracture of L1. 3. Disc degenerative changes at L4-L5. Electronically Signed   By: Lajean Manes M.D.   On: 04/04/2019 08:55    ____________________________________________   PROCEDURES  Procedure(s) performed (including Critical Care):  Procedures ____________________________________________   INITIAL IMPRESSION / ASSESSMENT AND PLAN / ED COURSE  As part of my medical decision making, I reviewed the following data within the electronic MEDICAL RECORD NUMBER Notes from prior ED visits and [place] Controlled Substance Database    ----------------------------------------- 12:19 PM on 04/04/2019 ----------------------------------------- This provider spoke with the daughter who was driving from [place] to pick up patient.  Patient has a history of "bipolar" and could be off her medication.  Patient was noted by staff members to be screaming at times.  There were also periods where patient laid on the stretcher calmly and appear to be in no acute distress.  Patient was taken to the restroom by wheelchair twice during her stay due to the medication that she was given.  Both times nurses report that she was able to transfer from the wheelchair, stand, sit on the toilet and then stand up on her own.  Patient was given Toradol 30 mg IM and methocarbamol 500 milligrams p.o. and hydrocodone 1 tablet.  Patient states that this did not help with her pain.  In talking with her she states that nothing will help except for getting a "Xanax".  Patient states that she has anxiety issues and that no doctor will continue to prescribe Xanax for her.  She has changed  PCPs in order to get Xanax only to be sent for evaluation at [facility].  She voices her anger at being given a weeks worth of Xanax when she wants a month supply.  She states that she is now being seen at [facility] clinic and was encouraged to make an  appointment to discuss her anxiety issues.  At the time of discharge patient was able to stand with little assistance.  Daughter from [place] came to pick up this patient.  I spoke with the daughter on the phone to gather some extra history and understanding of patient's mental issues.   ____________________________________________   FINAL CLINICAL IMPRESSION(S) / ED DIAGNOSES  Final diagnoses:  Acute bilateral low back pain, unspecified whether sciatica present  Anxiety     ED Discharge Orders         Ordered    HYDROcodone-acetaminophen (NORCO/VICODIN) 5-325 MG tablet  Every 6 hours PRN     04/04/19 1016    methocarbamol (ROBAXIN) 500 MG tablet  Every 6 hours PRN     04/04/19 1016           Note:  This document was prepared using Dragon voice recognition software and may include unintentional dictation errors.    Tommi Rumps, PA-C 04/04/19 1401    Shaune Pollack, MD 04/05/19 1010

## 2019-04-04 NOTE — ED Notes (Signed)
Pt assisted to toilet with wheelchair

## 2019-04-04 NOTE — ED Notes (Signed)
Patient to waiting room via wheelchair by EMS from home.  EMS reports that patient complains of right lower back pain that radiates down right leg since 7 pm that got progressively worse.  EMS reported that vital signs wnl.  EMS also reports that as they were enroute to hospital that [facility] notified them that daughter had called them and reported that patient had made SI statements to her.

## 2019-04-04 NOTE — ED Notes (Signed)
Pt crying and yelling, states her pain is worse and medication did not help. Provider notified. Pt refuses to sign papers until daughter speaks with provider (daughter on phone).

## 2019-04-05 ENCOUNTER — Emergency Department: Payer: Medicaid Other

## 2019-04-05 ENCOUNTER — Other Ambulatory Visit: Payer: Self-pay

## 2019-04-05 ENCOUNTER — Encounter: Payer: Self-pay | Admitting: Emergency Medicine

## 2019-04-05 ENCOUNTER — Emergency Department
Admission: EM | Admit: 2019-04-05 | Discharge: 2019-04-05 | Disposition: A | Discharge status: Home / Self Care | Payer: Medicaid Other | Attending: Emergency Medicine | Admitting: Emergency Medicine

## 2019-04-05 DIAGNOSIS — I10 Essential (primary) hypertension: Secondary | ICD-10-CM | POA: Diagnosis not present

## 2019-04-05 DIAGNOSIS — M79604 Pain in right leg: Secondary | ICD-10-CM | POA: Diagnosis not present

## 2019-04-05 DIAGNOSIS — M5441 Lumbago with sciatica, right side: Secondary | ICD-10-CM | POA: Diagnosis not present

## 2019-04-05 DIAGNOSIS — W1839XD Other fall on same level, subsequent encounter: Secondary | ICD-10-CM | POA: Insufficient documentation

## 2019-04-05 DIAGNOSIS — Z79899 Other long term (current) drug therapy: Secondary | ICD-10-CM | POA: Insufficient documentation

## 2019-04-05 DIAGNOSIS — M545 Low back pain: Secondary | ICD-10-CM | POA: Diagnosis present

## 2019-04-05 DIAGNOSIS — R2689 Other abnormalities of gait and mobility: Secondary | ICD-10-CM | POA: Diagnosis not present

## 2019-04-05 DIAGNOSIS — M25551 Pain in right hip: Secondary | ICD-10-CM | POA: Diagnosis not present

## 2019-04-05 LAB — URINALYSIS, COMPLETE (UACMP) WITH MICROSCOPIC
Glucose, UA: NEGATIVE mg/dL
Hgb urine dipstick: NEGATIVE
Ketones, ur: NEGATIVE mg/dL
Leukocytes,Ua: NEGATIVE
Nitrite: NEGATIVE
Protein, ur: 30 mg/dL — AB
Specific Gravity, Urine: >1.046 — ABNORMAL HIGH (ref 1.005–1.030)
pH: 5 (ref 5.0–8.0)

## 2019-04-05 LAB — CBC
HCT: 37 % (ref 36.0–46.0)
Hemoglobin: 11.9 g/dL — ABNORMAL LOW (ref 12.0–15.0)
MCH: 32.1 pg (ref 26.0–34.0)
MCHC: 32.2 g/dL (ref 30.0–36.0)
MCV: 99.7 fL (ref 80.0–100.0)
Platelets: 201 10*3/uL (ref 150–400)
RBC: 3.71 MIL/uL — ABNORMAL LOW (ref 3.87–5.11)
RDW: 12.4 % (ref 11.5–15.5)
WBC: 7.5 10*3/uL (ref 4.0–10.5)
nRBC: 0 % (ref 0.0–0.2)

## 2019-04-05 LAB — COMPREHENSIVE METABOLIC PANEL
ALT: 11 U/L (ref 0–44)
AST: 12 U/L — ABNORMAL LOW (ref 15–41)
Albumin: 4.1 g/dL (ref 3.5–5.0)
Alkaline Phosphatase: 58 U/L (ref 38–126)
Anion gap: 7 (ref 5–15)
BUN: 20 mg/dL (ref 8–23)
CO2: 24 mmol/L (ref 22–32)
Calcium: 9.2 mg/dL (ref 8.9–10.3)
Chloride: 108 mmol/L (ref 98–111)
Creatinine, Ser: 0.62 mg/dL (ref 0.44–1.00)
GFR calc Af Amer: >60 mL/min (ref >60)
GFR calc non Af Amer: >60 mL/min (ref >60)
Glucose, Bld: 108 mg/dL — ABNORMAL HIGH (ref 70–99)
Potassium: 3.9 mmol/L (ref 3.5–5.1)
Sodium: 139 mmol/L (ref 135–145)
Total Bilirubin: 0.7 mg/dL (ref 0.3–1.2)
Total Protein: 7.5 g/dL (ref 6.5–8.1)

## 2019-04-05 MED ORDER — KETOROLAC TROMETHAMINE 30 MG/ML IJ SOLN
30.0000 mg | Freq: Once | INTRAMUSCULAR | Status: AC
  Administered 2019-04-05: 30 mg via INTRAVENOUS
  Filled 2019-04-05: qty 1

## 2019-04-05 MED ORDER — PREDNISONE 20 MG PO TABS
60.0000 mg | ORAL_TABLET | Freq: Once | ORAL | Status: AC
  Administered 2019-04-05: 60 mg via ORAL
  Filled 2019-04-05: qty 3

## 2019-04-05 MED ORDER — OXYCODONE-ACETAMINOPHEN 5-325 MG PO TABS
1.0000 | ORAL_TABLET | Freq: Once | ORAL | Status: AC
  Administered 2019-04-05: 1 via ORAL
  Filled 2019-04-05: qty 1

## 2019-04-05 MED ORDER — LIDOCAINE 5 % EX PTCH: 1.0000 | MEDICATED_PATCH | CUTANEOUS | 0 refills | Status: DC

## 2019-04-05 MED ORDER — PREDNISONE 10 MG PO TABS: 40.0000 mg | ORAL_TABLET | Freq: Every day | ORAL | 0 refills | Status: AC

## 2019-04-05 MED ORDER — LIDOCAINE 5 % EX PTCH
1.0000 | MEDICATED_PATCH | CUTANEOUS | Status: DC
  Administered 2019-04-05: 1 via TRANSDERMAL
  Filled 2019-04-05: qty 1

## 2019-04-05 MED ORDER — ORPHENADRINE CITRATE 30 MG/ML IJ SOLN
30.0000 mg | Freq: Two times a day (BID) | INTRAMUSCULAR | Status: DC
  Administered 2019-04-05: 30 mg via INTRAVENOUS
  Filled 2019-04-05: qty 2

## 2019-04-05 NOTE — ED Notes (Signed)
PT in with pt.

## 2019-04-05 NOTE — ED Notes (Signed)
Pt remains sitting in w/c playing games on phone but conts to yell out and cry in pain  States nothing is helping her pain  Provider aware

## 2019-04-05 NOTE — TOC Initial Note (Addendum)
Transition of Care [facility]) - Initial/Assessment Note    Patient Details  Name: Beth Parker MRN: [id number] Date of Birth: 03/15/1955  Transition of Care [facility]) CM/SW Contact:    Anselm Pancoast, RN Phone Number: 04/05/2019, 2:40 PM  Clinical Narrative:                 Spoke to patient who is sitting up in bed and able to engage in conversation without difficulty. Confirmed patient has EBT which provides her food, is active with [facility] who she saw last week. MD made suggestion for patient to follow up at [facility]-patient went by and picked up paperwork last week. Lives alone and has cane from back surgery many years ago that she is currently using. Patient states she has high anxiety and was taken off her Xanax recently and has been unable to get that filled due to some concerns her doctor had with addiction. Patient states she has no income and is living off her savings account. Patient has history of Bipolar Disorder and Depression and states she quit taking her medication because she did not need it anymore. Patient utilizing [facility] for transportation to appointments when she is not able to drive due to pain or money for gas. RN CM discussed option of OP PT due to inability to find [facility] agency with availability. RN CM discussed with provider and confirmed OP PT. Patient states she thinks she has a ride home if needed.    Expected Discharge Plan: Home/Self Care     Patient Goals and CMS Choice Patient states their goals for this hospitalization and ongoing recovery are:: get pain managed      Expected Discharge Plan and Services Expected Discharge Plan: Home/Self Care       Living arrangements for the past 2 months: Single Family Home                                      Prior Living Arrangements/Services Living arrangements for the past 2 months: Single Family Home Lives with:: Self Patient language and need for interpreter reviewed:: Yes Do you feel safe going back to the  place where you live?: Yes      Need for Family Participation in Patient Care: No (Comment) Care giver support system in place?: Yes (comment) Current home services: DME(cane) Criminal Activity/Legal Involvement Pertinent to Current Situation/Hospitalization: No - Comment as needed  Activities of Daily Living      Permission Sought/Granted Permission sought to share information with : Facility Art therapist granted to share information with : Yes, Verbal Permission Granted     Permission granted to share info w AGENCY: OP Physical Therapy        Emotional Assessment Appearance:: Appears stated age Attitude/Demeanor/Rapport: Engaged Affect (typically observed): Accepting Orientation: : Oriented to Self, Oriented to Place, Oriented to  Time, Oriented to Situation Alcohol / Substance Use: Never Used Psych Involvement: No (comment)  Admission diagnosis:  ems lobby/back pain Patient Active Problem List   Diagnosis Date Noted  . Hyperglycemia 10/16/2011  . Shoulder pain 05/17/2011  . Migraine 04/25/2011  . Fracture of rib of right side 02/07/2011  . 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 Breast mass, right 06/13/2010  . BACK PAIN, CHRONIC 12/31/2006  . HYPERCHOLESTEROLEMIA 07/31/2006  . STATE, SYMPTOMATIC MENOPAUSE/FEM CLIMACTERIC 06/27/2006  . Anxiety 06/26/2006  . Depression, recurrent ([place]) 06/26/2006  . HTN (hypertension) 06/26/2006  .  HEMORRHOIDS 06/26/2006  . GERD 06/26/2006   PCP:  Particia Nearing, PA-C Pharmacy:   [phone] [place], [place] - [address] [place] [address] Phone: [phone] Fax: [phone]  [facility] - [place], [place] - [address] [place] [address] Phone: [phone] Fax: [phone]  [facility] [address] [place], [place] - [address] [place] [address] Phone: [phone] Fax: [phone]     Social  Determinants of Health (SDOH) Interventions    Readmission Risk Interventions No flowsheet data found.

## 2019-04-05 NOTE — ED Notes (Signed)
Pt sitting in w/c  Awaiting cab  I did explain the wait   States she understands but is concerned about getting into her house  She has 5 steps to go up

## 2019-04-05 NOTE — ED Provider Notes (Signed)
[facility] Emergency Department Provider Note  ____________________________________________  Time seen: Approximately 11:00 AM  I have reviewed the triage vital signs and the nursing notes.   HISTORY  Chief Complaint Back Pain    HPI Beth Parker is a 64 y.o. female that presents to the emergency department for evaluation of acute on chronic right-sided low back pain that radiates down right leg for 2 days. Pain starts in her back and radiates down the back of her right leg to her knee.  Patient states that back pain started after she rolled her ankle in the kitchen.  The pain to her back after rolling her ankle caused her to fall.  She was evaluated in this emergency department yesterday.  Patient states the pain has not improved.  She was sent home with prescriptions for hydrocodone and Robaxin.  She took a dose of both this morning.  No bowel or bladder dysfunction or saddle anesthesias.  Patient states that the only thing that is helped her back pain in the past has been surgery.  Patient currently only sees primary care for her back pain.  Patient was receiving a prescription for Xanax for anxiety but is no longer being prescribed this.  No fevers or recent illness.  No dysuria, frequency or urgency.  No numbness, tingling.  Past Medical History:  Diagnosis Date  . Anxiety   . Arthritis    knee and back, h/o chronic L1 fracture  . Back pain    h/o L1 fracture  . Depression   . Fractured elbow   . GERD (gastroesophageal reflux disease)   . High cholesterol   . Hypertension   . Migraine   . Rib fracture    R 5th, 01/2011    Patient Active Problem List   Diagnosis Date Noted  . Hyperglycemia 10/16/2011  . Shoulder pain 05/17/2011  . Migraine 04/25/2011  . Fracture of rib of right side 02/07/2011  . 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 Breast mass, right 06/13/2010  . BACK PAIN, CHRONIC 12/31/2006  . HYPERCHOLESTEROLEMIA 07/31/2006  . STATE,  SYMPTOMATIC MENOPAUSE/FEM CLIMACTERIC 06/27/2006  . Anxiety 06/26/2006  . Depression, recurrent ([place]) 06/26/2006  . HTN (hypertension) 06/26/2006  . HEMORRHOIDS 06/26/2006  . GERD 06/26/2006    Past Surgical History:  Procedure Laterality Date  . ABDOMINAL HYSTERECTOMY  1986   Right ovary remains  . ANKLE SURGERY  2005  . BREAST BIOPSY  08-23-2010   right (benign)  . [place]  . CESAREAN SECTION    . COLONOSCOPY N/A 05/30/2014   Procedure: COLONOSCOPY;  Surgeon: Hulen Luster, MD;  Location: [facility] END[facility]COPY;  Service: Gastroenterology;  Laterality: N/A;  . ESOPHAGOGASTRODUODENOSCOPY N/A 05/30/2014   Procedure: ESOPHAGOGASTRODUODENOSCOPY (EGD);  Surgeon: Hulen Luster, MD;  Location: [facility] ENDOSCOPY;  Service: Gastroenterology;  Laterality: N/A;  . HEMORRHOID SURGERY     internal and external  . TUBAL LIGATION    . WRIST SURGERY     right (ganglion cyst)    Prior to Admission medications   Medication Sig Start Date End Date Taking? Authorizing Provider  Acetaminophen (TYLENOL PO) Take by mouth as needed. 500 mg tablet. Taking 9 tablets a day    [provider]  albuterol (VENTOLIN HFA) 108 (90 Base) MCG/ACT inhaler Inhale 2 puffs into the lungs every 6 (six) hours as needed for wheezing or shortness of breath. 01/28/19   Volney American, PA-C  amLODipine (NORVASC) 10 MG tablet Take 10 mg  by mouth daily.    [provider]  Docusate Sodium (STOOL SOFTENER LAXATIVE PO) Take by mouth 2 (two) times daily.    [provider]  Doxylamine Succinate, Sleep, (SLEEP AID PO) Take by mouth daily.    [provider]  estradiol (ESTRACE) 1 MG tablet Take by mouth daily.  06/19/18   [provider]  FLUoxetine (PROZAC) 20 MG tablet Take 1 tablet (20 mg total) by mouth daily. 12/25/18   Particia Nearing, PA-C  HYDROcodone-acetaminophen (NORCO/VICODIN) 5-325 MG tablet Take 1 tablet by mouth every 6 (six) hours as needed for moderate pain.  04/04/19   Tommi Rumps, PA-C  lidocaine (LIDODERM) 5 % Place 1 patch onto the skin daily. Remove & Discard patch within 12 hours or as directed by MD 04/05/19   Enid Derry, PA-C  lisinopril (ZESTRIL) 40 MG tablet Take 40 mg by mouth daily.    [provider]  meloxicam (MOBIC) 7.5 MG tablet Take 7.5 mg by mouth 2 (two) times daily.    [provider]  methocarbamol (ROBAXIN) 500 MG tablet Take 1 tablet (500 mg total) by mouth every 6 (six) hours as needed for muscle spasms. 04/04/19   Tommi Rumps, PA-C  metoprolol succinate (TOPROL-XL) 100 MG 24 hr tablet Take 1 tablet (100 mg total) by mouth daily. Take with or immediately following a meal. 10/15/11   Joaquim Nam, MD  omeprazole (PRILOSEC) 40 MG capsule Take 40 mg by mouth daily.    [provider]  predniSONE (DELTASONE) 10 MG tablet Take 4 tablets (40 mg total) by mouth daily for 4 days. 04/06/19 04/10/19  Enid Derry, PA-C  Vitamin D, Ergocalciferol, (DRISDOL) 1.25 MG (50000 UT) CAPS capsule TAKE ONE CAPSULE BY MOUTH ONCE A WEEK 06/26/18   [provider]    Allergies Fluphenazine  Family History  Problem Relation Age of Onset  . Hypertension Mother   . Alzheimer's disease Mother   . Cancer Father        prostate  . Mental illness Father   . Mental illness Sister   . Breast cancer Sister   . Cancer Sister        breast cancer  . Colon cancer Neg Hx     Social History Social History   Tobacco Use  . Smoking status: Former Games developer  . Smokeless tobacco: Never Used  . Tobacco comment: quit over 8 years ago  Substance Use Topics  . Alcohol use: No  . Drug use: No     Review of Systems  Constitutional: No fever/chills Respiratory: No SOB. Gastrointestinal: No abdominal pain.  No nausea, no vomiting.  Genitourinary: Negative for dysuria. Musculoskeletal: Positive for back pain. Skin: Negative for rash, abrasions, lacerations, ecchymosis. Neurological: Negative for  headaches, numbness or tingling   ____________________________________________   PHYSICAL EXAM:  VITAL SIGNS: ED Triage Vitals  Enc Vitals Group     BP 04/05/19 0853 (!) 169/92     Pulse --      Resp 04/05/19 0853 20     Temp 04/05/19 0853 98.2 F (36.8 C)     Temp Source 04/05/19 0853 Oral     SpO2 04/05/19 0853 96 %     Weight 04/05/19 0824 183 lb 13.8 oz (83.4 kg)     Height 04/05/19 0824 5\' 2"  (1.575 m)     Head Circumference --      Peak Flow --      Pain Score 04/05/19 0824 10  Pain Loc --      Pain Edu? --      Excl. in GC? --      Constitutional: Alert and oriented. Well appearing and in no acute distress.  Financial risk analystlaying solitaire on phone. Eyes: Conjunctivae are normal. PERRL. EOMI. Head: Atraumatic. ENT:      Ears:      Nose: No congestion/rhinnorhea.      Mouth/Throat: Mucous membranes are moist.  Neck: No stridor. No cervical spine tenderness to palpation. Cardiovascular: Normal rate, regular rhythm.  Good peripheral circulation. Respiratory: Normal respiratory effort without tachypnea or retractions. Lungs CTAB. Good air entry to the bases with no decreased or absent breath sounds. Gastrointestinal: Bowel sounds 4 quadrants. Soft and nontender to palpation. No guarding or rigidity. No palpable masses. No distention.  Musculoskeletal: Full range of motion to all extremities. No gross deformities appreciated.  No tenderness to palpation over lumbar spine.  Tenderness to palpation to right mid lumbar paraspinal muscles.  Full external and internal rotation of right hip.  Strength equal in lower extremities bilaterally.  Able to push herself up in bed using both of her feet.  Antalgic gait.  Weightbearing.  Neurologic:  Normal speech and language. No gross focal neurologic deficits are appreciated.  Skin:  Skin is warm, dry and intact. No rash noted. Psychiatric: Mood and affect are normal. Speech and behavior are normal. Patient exhibits appropriate insight and  judgement.   ____________________________________________   LABS (all labs ordered are listed, but only abnormal results are displayed)  Labs Reviewed  URINALYSIS, COMPLETE (UACMP) WITH MICROSCOPIC - Abnormal; Notable for the following components:      Result Value   Color, Urine YELLOW (*)    APPearance CLEAR (*)    Specific Gravity, Urine >1.046 (*)    Bilirubin Urine MODERATE (*)    Protein, ur 30 (*)    Bacteria, UA RARE (*)    All other components within normal limits  CBC - Abnormal; Notable for the following components:   RBC 3.71 (*)    Hemoglobin 11.9 (*)    All other components within normal limits  COMPREHENSIVE METABOLIC PANEL - Abnormal; Notable for the following components:   Glucose, Bld 108 (*)    AST 12 (*)    All other components within normal limits   ____________________________________________  EKG   ____________________________________________  RADIOLOGY Lexine BatonI, Annamae Shivley, personally viewed and evaluated these images (plain radiographs) as part of my medical decision making, as well as reviewing the written report by the radiologist.  US Venous Img Lower Unilateral Right  Result Date: 04/05/2019 CLINICAL DATA:  Right lower extremity pain.  Evaluate for DVT. EXAM: RIGHT LOWER EXTREMITY VENOUS DOPPLER ULTRASOUND TECHNIQUE: Gray-scale sonography with graded compression, as well as color Doppler and duplex ultrasound were performed to evaluate the lower extremity deep venous systems from the level of the common femoral vein and including the common femoral, femoral, profunda femoral, popliteal and calf veins including the posterior tibial, peroneal and gastrocnemius veins when visible. The superficial great saphenous vein was also interrogated. Spectral Doppler was utilized to evaluate flow at rest and with distal augmentation maneuvers in the common femoral, femoral and popliteal veins. COMPARISON:  None. FINDINGS: Contralateral Common Femoral Vein:  Respiratory phasicity is normal and symmetric with the symptomatic side. No evidence of thrombus. Normal compressibility. Common Femoral Vein: No evidence of thrombus. Normal compressibility, respiratory phasicity and response to augmentation. Saphenofemoral Junction: No evidence of thrombus. Normal compressibility and flow on color Doppler imaging.  Profunda Femoral Vein: No evidence of thrombus. Normal compressibility and flow on color Doppler imaging. Femoral Vein: No evidence of thrombus. Normal compressibility, respiratory phasicity and response to augmentation. Popliteal Vein: No evidence of thrombus. Normal compressibility, respiratory phasicity and response to augmentation. Calf Veins: No evidence of thrombus. Normal compressibility and flow on color Doppler imaging. Superficial Great Saphenous Vein: No evidence of thrombus. Normal compressibility. Venous Reflux:  None. Other Findings:  None. IMPRESSION: No evidence of DVT within the right lower extremity. Electronically Signed   By: Simonne Come M.D.   On: 04/05/2019 13:15   DG Hip Unilat W or Wo Pelvis 2-3 Views Right  Result Date: 04/05/2019 CLINICAL DATA:  Right-sided hip pain.  Fall 2 days ago. EXAM: DG HIP (WITH OR WITHOUT PELVIS) 2-3V RIGHT COMPARISON:  None. FINDINGS: No acute fracture or dislocation. Mild bilateral superolateral hip joint space narrowing with small marginal acetabular osteophytes. Soft tissues are unremarkable. IMPRESSION: 1.  No acute osseous abnormality. 2. Mild bilateral hip degenerative changes. Electronically Signed   By: Obie Dredge M.D.   On: 04/05/2019 14:21    ____________________________________________    PROCEDURES  Procedure(s) performed:    Procedures    Medications  orphenadrine (NORFLEX) injection 30 mg (30 mg Intravenous Given 04/05/19 1119)  lidocaine (LIDODERM) 5 % 1 patch (1 patch Transdermal Patch Applied 04/05/19 1117)  ketorolac (TORADOL) 30 MG/ML injection 30 mg (30 mg Intravenous Given  04/05/19 1221)  oxyCODONE-acetaminophen (PERCOCET/ROXICET) 5-325 MG per tablet 1 tablet (1 tablet Oral Given 04/05/19 1247)  predniSONE (DELTASONE) tablet 60 mg (60 mg Oral Given 04/05/19 1450)     ____________________________________________   INITIAL IMPRESSION / ASSESSMENT AND PLAN / ED COURSE  Pertinent labs & imaging results that were available during my care of the patient were reviewed by me and considered in my medical decision making (see chart for details).  Review of the [place] CSRS was performed in accordance of the [facility] prior to dispensing any controlled drugs.   Patient presented to the emergency department for evaluation of acute on chronic low back pain.  Lumbar x-ray yesterday revealed no acute bony abnormality.  Hip x-ray today negative for acute bony abnormality.  Ultrasound negative for DVT.  Urinalysis noncontributory for cystitis.  Patient is playing games and texting on her phone.  She is sitting up in bed and able to move herself to the side of the bed. She was able to move herself around in the bed using her arms and pushing off with her feet with seemingly little difficulty.  She was able to ambulate without assistance with me. Provider that saw the patient yesterday states that patient was ambulatory yesterday as well.  Per the provider yesterday, patient has not had close contact with her daughter for the last 6 months.  ----------------------------------------- 10:30 AM on 04/05/2019 -----------------------------------------  Patient able to transfer herself from the wheelchair to the toilet without assistance.  ----------------------------------------- 1:00 PM on 04/05/2019 -----------------------------------------  Patient states the pain is worsening and she is unable to tolerate standing for hip x-ray.  ----------------------------------------- 1:45 PM on 04/05/2019 -----------------------------------------  Ultrasound negative for DVT. Patient texting and  playing games on her phone in bed. Patient has better movement following percocet administration. Full range of motion of hip without any obvious pain. Patient able to stand and move for hip x-ray following Percocet.  ----------------------------------------- 2:16 PM on 04/05/2019 -----------------------------------------  Dagoberto Reef RN Case Manager in to see the patient and will help to set up home PT.  -----------------------------------------  2:45 PM on 04/05/2019 -----------------------------------------  Patient requesting Xanax.  Patient states that she has discontinued this medication and is unable to find someone that can prescribe it for her.  Patient states that she requested the [place] clinic to prescribe this for her and it was declined.  Patient is currently on hold with the [facility] clinic to set up an appointment for follow-up.  ----------------------------------------- 4:02 PM on 04/05/2019 -----------------------------------------  Olga Coaster PT and DC patient and expresses recommendation for SNF placement.  Patient was moaning and groaning during PT evaluation.  Outside of this episode, she has not had any moaning or groaning during her 8 hours in the emergency department.  She has also been able to ambulate herself to the toilet multiple while here. I suspect that there are some additional psych issues contributing.  She declines homicidal or suicidal ideations.    Patient was offered admission to the hospital for SNF placement and declines. Patient states that she needs to get home to take care of her cat.  She does not wish to stay here tonight.  She also has plans on Wednesday morning. She does not wish to wait in the emergency department any longer, and she would like to leave now.  She states that she needs to be home by 5 PM.     ----------------------------------------- 4:15 PM on 04/05/2019 -----------------------------------------  Patient was again offered and  again declined admission for SNF placement.  She understands that she may return at any time.  She states that she will return to the emergency department if she has any difficulties at home.  Patient states that she has a new female friend and a preacher that may be able to help her at home.   Patient will be discharged home with prescriptions for prednisone. Patient is to follow up with PCP as directed. Patient is given ED precautions to return to the ED for any worsening or new symptoms.   Beth Parker was evaluated in Emergency Department on 04/05/2019 for the symptoms described in the history of present illness. She was evaluated in the context of the global COVID-19 pandemic, which necessitated consideration that the patient might be at risk for infection with the SARS-CoV-2 virus that causes COVID-19. Institutional protocols and algorithms that pertain to the evaluation of patients at risk for COVID-19 are in a state of rapid change based on information released by regulatory bodies including the CDC and federal and state organizations. These policies and algorithms were followed during the patient's care in the ED.   ____________________________________________  FINAL CLINICAL IMPRESSION(S) / ED DIAGNOSES  Final diagnoses:  Acute right-sided low back pain with right-sided sciatica      NEW MEDICATIONS STARTED DURING THIS VISIT:  ED Discharge Orders         Ordered    predniSONE (DELTASONE) 10 MG tablet  Daily     04/05/19 1439    lidocaine (LIDODERM) 5 %  Every 24 hours     04/05/19 1439              This chart was dictated using voice recognition software/Dragon. Despite best efforts to proofread, errors can occur which can change the meaning. Any change was purely unintentional.    Enid Derry, PA-C 04/05/19 1709    Sharman Cheek, MD 04/06/19 [phone]

## 2019-04-05 NOTE — ED Notes (Addendum)
Back from X-Ray   CM in with pt at present

## 2019-04-05 NOTE — Evaluation (Signed)
Physical Therapy Evaluation Patient Details Name: Beth Parker MRN: [id number] DOB: 15-Sep-1955 Today's Date: 04/05/2019   History of Present Illness  Pt is 64 yo female that presented to ED for significant low back pain that her medications are not helping. Pt seen yesterday (3/14) in ED for same complaint. Pt reported that her pain started after she fell at home, has a history of back pain. PMH of anxiety, depression, fx elbow, HLD, HTN, rib fx.    Clinical Impression  Pt alert, moaning/groaning in pain throughout session, stated her pain was a 10/10 in her low back and in her leg. Pt reported that she lives alone, with 5 STE her home without rails, and family lives far away. Prior to onset of back pain pt independent in ADLs and ambulation. Due to acute pain pt is having significant difficulty with all tasks at home, and has had 1 fall.  The patient was seated in transport chair at start of session. CGA with significant time needed and use of UE to perform sit <> stand with standard walker. The patient was able to ambulate ~48ft with significant trunk flexion, decreased stride length and gait velocity. Official MMT difficult to perform due to pt reported pain levels. Overall the patient demonstrated deficits (see "PT Problem List") that impede the patient's functional abilities, safety, and mobility and would benefit from skilled PT intervention. Recommendation is SNF due to decline in functional status, decreased caregiver support and inaccessible home environment.       Follow Up Recommendations SNF    Equipment Recommendations  Rolling walker with 5" wheels    Recommendations for Other Services       Precautions / Restrictions Precautions Precautions: Fall Restrictions Weight Bearing Restrictions: No      Mobility  Bed Mobility               General bed mobility comments: deferred pt in transport chair  Transfers Overall transfer level: Needs assistance Equipment  used: Standard walker Transfers: Sit to/from Stand Sit to Stand: Min guard            Ambulation/Gait Ambulation/Gait assistance: Min guard Gait Distance (Feet): 4 Feet Assistive device: Standard walker Gait Pattern/deviations: Trunk flexed;Wide base of support;Decreased stride length     General Gait Details: Pt endorsed significant pain, unable to ambulate further  Stairs            Wheelchair Mobility    Modified Rankin (Stroke Patients Only)       Balance Overall balance assessment: Needs assistance Sitting-balance support: Feet supported Sitting balance-Leahy Scale: Fair       Standing balance-Leahy Scale: Poor                               Pertinent Vitals/Pain Pain Assessment: 0-10 Pain Score: 10-Worst pain ever Pain Location: low back, R leg Pain Descriptors / Indicators: Aching;Grimacing Pain Intervention(s): Limited activity within patient's tolerance;Repositioned;Monitored during session;Premedicated before session    Home Living Family/patient expects to be discharged to:: Private residence Living Arrangements: Alone Available Help at Discharge: Family;Available PRN/intermittently Type of Home: House Home Access: Stairs to enter Entrance Stairs-Rails: None Entrance Stairs-Number of Steps: 5   Home Equipment: Cane - single point Additional Comments: Pt reported 1 fall prior to ED visit this week.    Prior Function Level of Independence: Needs assistance   Gait / Transfers Assistance Needed: Pt normally ambulates without cane but due to significant  pain is unable to ambulate  ADL's / Homemaking Assistance Needed: normally independent/mod I pt is having increased difficulty with ADLs        Hand Dominance        Extremity/Trunk Assessment   Upper Extremity Assessment Upper Extremity Assessment: Generalized weakness    Lower Extremity Assessment Lower Extremity Assessment: Generalized weakness(difficulty assessing  due to significant pain)    Cervical / Trunk Assessment Cervical / Trunk Assessment: Kyphotic  Communication   Communication: No difficulties  Cognition Arousal/Alertness: Awake/alert Behavior During Therapy: WFL for tasks assessed/performed Overall Cognitive Status: Within Functional Limits for tasks assessed                                        General Comments      Exercises     Assessment/Plan    PT Assessment Patient needs continued PT services  PT Problem List Decreased strength;Decreased range of motion;Decreased activity tolerance;Decreased balance;Pain;Decreased knowledge of use of DME;Decreased mobility       PT Treatment Interventions DME instruction;Therapeutic activities;Gait training;Therapeutic exercise;Patient/family education;Stair training;Balance training;Functional mobility training;Neuromuscular re-education    PT Goals (Current goals can be found in the Care Plan section)  Acute Rehab PT Goals Patient Stated Goal: to decrease pain PT Goal Formulation: With patient Time For Goal Achievement: 04/19/19 Potential to Achieve Goals: Good    Frequency Min 2X/week   Barriers to discharge Inaccessible home environment;Decreased caregiver support      Co-evaluation               AM-PAC PT "6 Clicks" Mobility  Outcome Measure Help needed turning from your back to your side while in a flat bed without using bedrails?: A Little Help needed moving from lying on your back to sitting on the side of a flat bed without using bedrails?: A Little Help needed moving to and from a bed to a chair (including a wheelchair)?: A Little Help needed standing up from a chair using your arms (e.g., wheelchair or bedside chair)?: A Little Help needed to walk in hospital room?: A Little Help needed climbing 3-5 steps with a railing? : A Lot 6 Click Score: 17    End of Session Equipment Utilized During Treatment: Gait belt Activity Tolerance: Patient  limited by pain Patient left: in chair Nurse Communication: Mobility status PT Visit Diagnosis: Other abnormalities of gait and mobility (R26.89);Muscle weakness (generalized) (M62.81);Pain Pain - Right/Left: Right(and midline) Pain - part of body: Leg(and low back)    Time: 1601-0932 PT Time Calculation (min) (ACUTE ONLY): 13 min   Charges:   PT Evaluation $PT Eval Moderate Complexity: 1 Mod         Lieutenant Diego PT, DPT 4:14 PM,04/05/19

## 2019-04-05 NOTE — ED Triage Notes (Signed)
Chronic back pain by ems.  Was seen yesterday here for same.  Says med not working.  169/92, hr 63 sat96 RA.  t 98.2

## 2019-04-05 NOTE — ED Notes (Signed)
See triage note  Presents with chronic back pain  States pain is moving into right leg  Was seen yesterday for same  States her pain meds are not helping   States she took medication about 430 am  Pt is yelling and moaning

## 2019-04-28 ENCOUNTER — Ambulatory Visit: Payer: Self-pay | Admitting: Gastroenterology

## 2019-06-26 ENCOUNTER — Other Ambulatory Visit: Payer: Self-pay

## 2019-06-26 DIAGNOSIS — Y9389 Activity, other specified: Secondary | ICD-10-CM | POA: Insufficient documentation

## 2019-06-26 DIAGNOSIS — I10 Essential (primary) hypertension: Secondary | ICD-10-CM | POA: Diagnosis not present

## 2019-06-26 DIAGNOSIS — Z87891 Personal history of nicotine dependence: Secondary | ICD-10-CM | POA: Diagnosis not present

## 2019-06-26 DIAGNOSIS — W57XXXA Bitten or stung by nonvenomous insect and other nonvenomous arthropods, initial encounter: Secondary | ICD-10-CM | POA: Insufficient documentation

## 2019-06-26 DIAGNOSIS — S50862A Insect bite (nonvenomous) of left forearm, initial encounter: Secondary | ICD-10-CM | POA: Insufficient documentation

## 2019-06-26 DIAGNOSIS — Y92018 Other place in single-family (private) house as the place of occurrence of the external cause: Secondary | ICD-10-CM | POA: Insufficient documentation

## 2019-06-26 DIAGNOSIS — Z79899 Other long term (current) drug therapy: Secondary | ICD-10-CM | POA: Diagnosis not present

## 2019-06-26 DIAGNOSIS — Y998 Other external cause status: Secondary | ICD-10-CM | POA: Insufficient documentation

## 2019-06-26 DIAGNOSIS — L03114 Cellulitis of left upper limb: Secondary | ICD-10-CM | POA: Diagnosis not present

## 2019-06-26 NOTE — ED Notes (Signed)
Jewlery removed and ice provided for pt to minimize swelling.

## 2019-06-26 NOTE — ED Notes (Signed)
Patient up to stat desk "Is anyone going to see me tonight".  Explained to patient reason for wait.  Also noted that redness has extended past drawn circle.

## 2019-06-26 NOTE — ED Triage Notes (Signed)
Pt comes POV after getting bitten by something on her left wrist. One bite mark evident with some welting/redness around the bite about 4 inches in diameter.

## 2019-06-27 ENCOUNTER — Emergency Department
Admission: EM | Admit: 2019-06-27 | Discharge: 2019-06-27 | Disposition: A | Discharge status: Home / Self Care | Payer: Medicaid Other | Attending: Emergency Medicine | Admitting: Emergency Medicine

## 2019-06-27 DIAGNOSIS — S50862A Insect bite (nonvenomous) of left forearm, initial encounter: Secondary | ICD-10-CM

## 2019-06-27 DIAGNOSIS — L03114 Cellulitis of left upper limb: Secondary | ICD-10-CM

## 2019-06-27 MED ORDER — CEPHALEXIN 500 MG PO CAPS: 500.0000 mg | ORAL_CAPSULE | Freq: Three times a day (TID) | ORAL | 0 refills | Status: DC

## 2019-06-27 NOTE — ED Provider Notes (Signed)
[facility] Emergency Department Provider Note  ____________________________________________  Time seen: Approximately 2:16 AM  I have reviewed the triage vital signs and the nursing notes.   HISTORY  Chief Complaint Insect Bite    HPI Beth Parker is a 64 y.o. female with a history of GERD hypertension who reports that she had a trip and fall on brick steps walking out of her house today.  When she had her arm down on the ground to catch herself and lift her self back up she felt something bite her on the forearm.  She has had gradual onset of swelling and pain throughout the afternoon.  The erythema was delineated on arrival to triage at about 9 PM last night..      Past Medical History:  Diagnosis Date  . Anxiety   . Arthritis    knee and back, h/o chronic L1 fracture  . Back pain    h/o L1 fracture  . Depression   . Fractured elbow   . GERD (gastroesophageal reflux disease)   . High cholesterol   . Hypertension   . Migraine   . Rib fracture    R 5th, 01/2011     Patient Active Problem List   Diagnosis Date Noted  . Hyperglycemia 10/16/2011  . Shoulder pain 05/17/2011  . Migraine 04/25/2011  . Fracture of rib of right side 02/07/2011  . 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 Breast mass, right 06/13/2010  . BACK PAIN, CHRONIC 12/31/2006  . HYPERCHOLESTEROLEMIA 07/31/2006  . STATE, SYMPTOMATIC MENOPAUSE/FEM CLIMACTERIC 06/27/2006  . Anxiety 06/26/2006  . Depression, recurrent (HCC) 06/26/2006  . HTN (hypertension) 06/26/2006  . HEMORRHOIDS 06/26/2006  . GERD 06/26/2006     Past Surgical History:  Procedure Laterality Date  . ABDOMINAL HYSTERECTOMY  1986   Right ovary remains  . ANKLE SURGERY  2005  . BREAST BIOPSY  08-23-2010   right (benign)  . CERVICAL DISC SURGERY  1994  . CESAREAN SECTION    . COLONOSCOPY N/A 05/30/2014   Procedure: COLONOSCOPY;  Surgeon: Wallace Cullens, MD;  Location: [facility] ENDOSCOPY;  Service:  Gastroenterology;  Laterality: N/A;  . ESOPHAGOGASTRODUODENOSCOPY N/A 05/30/2014   Procedure: ESOPHAGOGASTRODUODENOSCOPY (EGD);  Surgeon: Wallace Cullens, MD;  Location: [facility] ENDOSCOPY;  Service: Gastroenterology;  Laterality: N/A;  . HEMORRHOID SURGERY     internal and external  . TUBAL LIGATION    . WRIST SURGERY     right (ganglion cyst)     Prior to Admission medications   Medication Sig Start Date End Date Taking? Authorizing Provider  Acetaminophen (TYLENOL PO) Take by mouth as needed. 500 mg tablet. Taking 9 tablets a day    [provider]  albuterol (VENTOLIN HFA) 108 (90 Base) MCG/ACT inhaler Inhale 2 puffs into the lungs every 6 (six) hours as needed for wheezing or shortness of breath. 01/28/19   Particia Nearing, PA-C  amLODipine (NORVASC) 10 MG tablet Take 10 mg by mouth daily.    [provider]  cephALEXin (KEFLEX) 500 MG capsule Take 1 capsule (500 mg total) by mouth 3 (three) times daily. 06/27/19   Sharman Cheek, MD  Docusate Sodium (STOOL SOFTENER LAXATIVE PO) Take by mouth 2 (two) times daily.    [provider]  Doxylamine Succinate, Sleep, (SLEEP AID PO) Take by mouth daily.    [provider]  estradiol (ESTRACE) 1 MG tablet Take by mouth daily.  06/19/18   [provider]  FLUoxetine (PROZAC) 20 MG tablet  Take 1 tablet (20 mg total) by mouth daily. 12/25/18   Particia Nearing, PA-C  HYDROcodone-acetaminophen (NORCO/VICODIN) 5-325 MG tablet Take 1 tablet by mouth every 6 (six) hours as needed for moderate pain. 04/04/19   Tommi Rumps, PA-C  lidocaine (LIDODERM) 5 % Place 1 patch onto the skin daily. Remove & Discard patch within 12 hours or as directed by MD 04/05/19   Enid Derry, PA-C  lisinopril (ZESTRIL) 40 MG tablet Take 40 mg by mouth daily.    [provider]  meloxicam (MOBIC) 7.5 MG tablet Take 7.5 mg by mouth 2 (two) times daily.    [provider]  methocarbamol (ROBAXIN) 500 MG tablet  Take 1 tablet (500 mg total) by mouth every 6 (six) hours as needed for muscle spasms. 04/04/19   Tommi Rumps, PA-C  metoprolol succinate (TOPROL-XL) 100 MG 24 hr tablet Take 1 tablet (100 mg total) by mouth daily. Take with or immediately following a meal. 10/15/11   Joaquim Nam, MD  omeprazole (PRILOSEC) 40 MG capsule Take 40 mg by mouth daily.    [provider]  Vitamin D, Ergocalciferol, (DRISDOL) 1.25 MG (50000 UT) CAPS capsule TAKE ONE CAPSULE BY MOUTH ONCE A WEEK 06/26/18   [provider]     Allergies Fluphenazine   Family History  Problem Relation Age of Onset  . Hypertension Mother   . Alzheimer's disease Mother   . Cancer Father        prostate  . Mental illness Father   . Mental illness Sister   . Breast cancer Sister   . Cancer Sister        breast cancer  . Colon cancer Neg Hx     Social History Social History   Tobacco Use  . Smoking status: Former Games developer  . Smokeless tobacco: Never Used  . Tobacco comment: quit over 8 years ago  Substance Use Topics  . Alcohol use: No  . Drug use: No    Review of Systems  Constitutional:   No fever or chills.  ENT:   No sore throat. No rhinorrhea. Cardiovascular:   No chest pain or syncope. Respiratory:   No dyspnea or cough. Gastrointestinal:   Negative for abdominal pain, vomiting and diarrhea.  Musculoskeletal:   Positive left forearm pain and swelling All other systems reviewed and are negative except as documented above in ROS and HPI.  ____________________________________________   PHYSICAL EXAM:  VITAL SIGNS: ED Triage Vitals  Enc Vitals Group     BP 06/26/19 2041 (!) 186/80     Pulse Rate 06/26/19 2041 77     Resp 06/26/19 2041 18     Temp 06/26/19 2041 99 F (37.2 C)     Temp Source 06/26/19 2041 Oral     SpO2 06/26/19 2041 97 %     Weight 06/26/19 2041 173 lb (78.5 kg)     Height 06/26/19 2041 5\' 3"  (1.6 m)     Head Circumference --      Peak Flow --      Pain Score  06/26/19 2044 8     Pain Loc --      Pain Edu? --      Excl. in GC? --     Vital signs reviewed, nursing assessments reviewed.   Constitutional:   Alert and oriented. Non-toxic appearance. Eyes:   Conjunctivae are normal. EOMI. ENT      Head:   Normocephalic and atraumatic.  Mouth/Throat:   MMM      Neck:   No meningismus. Full ROM.  Cardiovascular:   RRR.  Cap refill less than 2 seconds. Respiratory:   Normal respiratory effort without tachypnea/retractions. Musculoskeletal:   Normal range of motion in all extremities.  No edema. Neurologic:   Normal speech and language.  Motor grossly intact. No acute focal neurologic deficits are appreciated.  Skin:    Skin is warm, dry and intact.  There is a approximately 6 cm area of erythema around the left volar forearm with a central small pustular eruption.  No induration warmth tenderness or drainage.  No crepitus.  No fluctuance.  Not extending beyond previously delineated boundaries from 5 hours ago. ____________________________________________    Reva Bores (pertinent positives/negatives) (all labs ordered are listed, but only abnormal results are displayed) Labs Reviewed - No data to display ____________________________________________   EKG  ____________________________________________    RADIOLOGY  No results found.  ____________________________________________   PROCEDURES Procedures  ____________________________________________  CLINICAL IMPRESSION / ASSESSMENT AND PLAN / ED COURSE  Pertinent labs & imaging results that were available during my care of the patient were reviewed by me and considered in my medical decision making (see chart for details).  Beth Parker was evaluated in Emergency Department on 06/27/2019 for the symptoms described in the history of present illness. She was evaluated in the context of the global COVID-19 pandemic, which necessitated consideration that the patient might be at risk for  infection with the SARS-CoV-2 virus that causes COVID-19. Institutional protocols and algorithms that pertain to the evaluation of patients at risk for COVID-19 are in a state of rapid change based on information released by regulatory bodies including the CDC and federal and state organizations. These policies and algorithms were followed during the patient's care in the ED.   Patient presents with erythema of the left forearm with a small pustule from what appears to be insect bite.  Not consistent with a snake envenomation.  No evidence of osteomyelitis necrotizing fasciitis abscess fracture or compartment syndrome.  Doubt MRSA.  Will give Keflex for early cellulitis.  Patient also requests referral to orthopedics due to a left elbow fracture from a year ago that was untreated.  Contact info provided.      ____________________________________________   FINAL CLINICAL IMPRESSION(S) / ED DIAGNOSES    Final diagnoses:  Insect bite of left forearm, initial encounter  Cellulitis of left upper extremity     ED Discharge Orders         Ordered    cephALEXin (KEFLEX) 500 MG capsule  3 times daily     06/27/19 0215          Portions of this note were generated with dragon dictation software. Dictation errors may occur despite best attempts at proofreading.   Carrie Mew, MD 06/27/19 Natasha Mead

## 2019-06-27 NOTE — ED Notes (Signed)
No peripheral IV placed this visit.   Discharge instructions reviewed with patient. Questions fielded by this RN. Patient verbalizes understanding of instructions. Patient discharged home in stable condition per stafford. No acute distress noted at time of discharge.   

## 2019-07-15 ENCOUNTER — Other Ambulatory Visit: Payer: Self-pay

## 2019-07-15 DIAGNOSIS — I10 Essential (primary) hypertension: Secondary | ICD-10-CM | POA: Insufficient documentation

## 2019-07-15 DIAGNOSIS — Z87891 Personal history of nicotine dependence: Secondary | ICD-10-CM | POA: Insufficient documentation

## 2019-07-15 DIAGNOSIS — X58XXXA Exposure to other specified factors, initial encounter: Secondary | ICD-10-CM | POA: Insufficient documentation

## 2019-07-15 DIAGNOSIS — Z79899 Other long term (current) drug therapy: Secondary | ICD-10-CM | POA: Insufficient documentation

## 2019-07-15 DIAGNOSIS — S52122K Displaced fracture of head of left radius, subsequent encounter for closed fracture with nonunion: Secondary | ICD-10-CM | POA: Diagnosis not present

## 2019-07-15 DIAGNOSIS — M2540 Effusion, unspecified joint: Secondary | ICD-10-CM | POA: Diagnosis present

## 2019-07-15 NOTE — ED Triage Notes (Signed)
Pt in with co chronic left  Elbow pain since fracture last year. States here for "a shot in the elbow".

## 2019-07-16 ENCOUNTER — Emergency Department: Payer: Medicaid Other

## 2019-07-16 ENCOUNTER — Emergency Department
Admission: EM | Admit: 2019-07-16 | Discharge: 2019-07-16 | Disposition: A | Discharge status: Home / Self Care | Payer: Medicaid Other | Attending: Emergency Medicine | Admitting: Emergency Medicine

## 2019-07-16 DIAGNOSIS — S52122K Displaced fracture of head of left radius, subsequent encounter for closed fracture with nonunion: Secondary | ICD-10-CM

## 2019-07-16 MED ORDER — LIDOCAINE HCL (PF) 1 % IJ SOLN: 5.0000 mL | Freq: Once | INTRAMUSCULAR | Status: DC

## 2019-07-16 MED ORDER — LIDOCAINE VISCOUS HCL 2 % MT SOLN
15.0000 mL | Freq: Once | OROMUCOSAL | Status: DC
  Filled 2019-07-16: qty 15

## 2019-07-16 MED ORDER — LIDOCAINE HCL (PF) 1 % IJ SOLN
INTRAMUSCULAR | Status: AC
  Filled 2019-07-16: qty 5

## 2019-07-16 MED ORDER — LIDOCAINE HCL (PF) 1 % IJ SOLN
10.0000 mL | Freq: Once | INTRAMUSCULAR | Status: AC
  Administered 2019-07-16: 10 mL

## 2019-07-16 MED ORDER — METHYLPREDNISOLONE ACETATE 80 MG/ML IJ SUSP
80.0000 mg | Freq: Once | INTRAMUSCULAR | Status: AC
  Administered 2019-07-16: 80 mg via INTRA_ARTICULAR
  Filled 2019-07-16: qty 1

## 2019-07-16 MED ORDER — KETOROLAC TROMETHAMINE 10 MG PO TABS
10.0000 mg | ORAL_TABLET | Freq: Once | ORAL | Status: AC
  Administered 2019-07-16: 10 mg via ORAL
  Filled 2019-07-16: qty 1

## 2019-07-16 NOTE — ED Provider Notes (Signed)
[facility] Emergency Department Provider Note  ____________________________________________   First MD Initiated Contact with Patient 07/16/19 [phone]     (approximate)  I have reviewed the triage vital signs and the nursing notes.   HISTORY  Chief Complaint Joint Swelling    HPI Beth Parker is a 64 y.o. female with below list of previous medical conditions including left elbow fracture which patient sustained April 03, 2018 presents to the emergency department secondary to continued left elbow pain periodically since then.  Patient presented to emergency department requesting a steroid shot for her elbow.  Patient states that she did not follow-up with orthopedic surgery as instructed to do.  Current pain score 9 out of 10 per patient.  Pain is worse with any movement of the elbow.        Past Medical History:  Diagnosis Date  . Anxiety   . Arthritis    knee and back, h/o chronic L1 fracture  . Back pain    h/o L1 fracture  . Depression   . Fractured elbow   . GERD (gastroesophageal reflux disease)   . High cholesterol   . Hypertension   . Migraine   . Rib fracture    R 5th, 01/2011    Patient Active Problem List   Diagnosis Date Noted  . Hyperglycemia 10/16/2011  . Shoulder pain 05/17/2011  . Migraine 04/25/2011  . Fracture of rib of right side 02/07/2011  . 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 Breast mass, right 06/13/2010  . BACK PAIN, CHRONIC 12/31/2006  . HYPERCHOLESTEROLEMIA 07/31/2006  . STATE, SYMPTOMATIC MENOPAUSE/FEM CLIMACTERIC 06/27/2006  . Anxiety 06/26/2006  . Depression, recurrent (HCC) 06/26/2006  . HTN (hypertension) 06/26/2006  . HEMORRHOIDS 06/26/2006  . GERD 06/26/2006    Past Surgical History:  Procedure Laterality Date  . ABDOMINAL HYSTERECTOMY  1986   Right ovary remains  . ANKLE SURGERY  2005  . BREAST BIOPSY  08-23-2010   right (benign)  . CERVICAL DISC SURGERY  1994  . CESAREAN SECTION    .  COLONOSCOPY N/A 05/30/2014   Procedure: COLONOSCOPY;  Surgeon: Wallace Cullens, MD;  Location: [facility] ENDOSCOPY;  Service: Gastroenterology;  Laterality: N/A;  . ESOPHAGOGASTRODUODENOSCOPY N/A 05/30/2014   Procedure: ESOPHAGOGASTRODUODENOSCOPY (EGD);  Surgeon: Wallace Cullens, MD;  Location: [facility] ENDOSCOPY;  Service: Gastroenterology;  Laterality: N/A;  . HEMORRHOID SURGERY     internal and external  . TUBAL LIGATION    . WRIST SURGERY     right (ganglion cyst)    Prior to Admission medications   Medication Sig Start Date End Date Taking? Authorizing Provider  Acetaminophen (TYLENOL PO) Take by mouth as needed. 500 mg tablet. Taking 9 tablets a day    [provider]  albuterol (VENTOLIN HFA) 108 (90 Base) MCG/ACT inhaler Inhale 2 puffs into the lungs every 6 (six) hours as needed for wheezing or shortness of breath. 01/28/19   Particia Nearing, PA-C  amLODipine (NORVASC) 10 MG tablet Take 10 mg by mouth daily.    [provider]  cephALEXin (KEFLEX) 500 MG capsule Take 1 capsule (500 mg total) by mouth 3 (three) times daily. 06/27/19   Sharman Cheek, MD  Docusate Sodium (STOOL SOFTENER LAXATIVE PO) Take by mouth 2 (two) times daily.    [provider]  Doxylamine Succinate, Sleep, (SLEEP AID PO) Take by mouth daily.    [provider]  estradiol (ESTRACE) 1 MG tablet Take by mouth daily.  06/19/18  [provider]  FLUoxetine (PROZAC) 20 MG tablet Take 1 tablet (20 mg total) by mouth daily. 12/25/18   Volney American, PA-C  HYDROcodone-acetaminophen (NORCO/VICODIN) 5-325 MG tablet Take 1 tablet by mouth every 6 (six) hours as needed for moderate pain. 04/04/19   Johnn Hai, PA-C  lidocaine (LIDODERM) 5 % Place 1 patch onto the skin daily. Remove & Discard patch within 12 hours or as directed by MD 04/05/19   Laban Emperor, PA-C  lisinopril (ZESTRIL) 40 MG tablet Take 40 mg by mouth daily.    [provider]  meloxicam (MOBIC) 7.5 MG  tablet Take 7.5 mg by mouth 2 (two) times daily.    [provider]  methocarbamol (ROBAXIN) 500 MG tablet Take 1 tablet (500 mg total) by mouth every 6 (six) hours as needed for muscle spasms. 04/04/19   Johnn Hai, PA-C  metoprolol succinate (TOPROL-XL) 100 MG 24 hr tablet Take 1 tablet (100 mg total) by mouth daily. Take with or immediately following a meal. 10/15/11   Tonia Ghent, MD  omeprazole (PRILOSEC) 40 MG capsule Take 40 mg by mouth daily.    [provider]  Vitamin D, Ergocalciferol, (DRISDOL) 1.25 MG (50000 UT) CAPS capsule TAKE ONE CAPSULE BY MOUTH ONCE A WEEK 06/26/18   [provider]    Allergies Fluphenazine  Family History  Problem Relation Age of Onset  . Hypertension Mother   . Alzheimer's disease Mother   . Cancer Father        prostate  . Mental illness Father   . Mental illness Sister   . Breast cancer Sister   . Cancer Sister        breast cancer  . Colon cancer Neg Hx     Social History Social History   Tobacco Use  . Smoking status: Former Research scientist (life sciences)  . Smokeless tobacco: Never Used  . Tobacco comment: quit over 8 years ago  Vaping Use  . Vaping Use: Never used  Substance Use Topics  . Alcohol use: No  . Drug use: No    Review of Systems Constitutional: No fever/chills Eyes: No visual changes. ENT: No sore throat. Cardiovascular: Denies chest pain. Respiratory: Denies shortness of breath. Gastrointestinal: No abdominal pain.  No nausea, no vomiting.  No diarrhea.  No constipation. Genitourinary: Negative for dysuria. Musculoskeletal: Negative for neck pain.  Positive for left elbow pain negative for back pain. Integumentary: Negative for rash. Neurological: Negative for headaches, focal weakness or numbness.   ____________________________________________   PHYSICAL EXAM:  VITAL SIGNS: ED Triage Vitals  Enc Vitals Group     BP 07/15/19 2348 127/70     Pulse Rate 07/15/19 2348 81     Resp 07/15/19  2348 20     Temp 07/15/19 2348 98.5 F (36.9 C)     Temp Source 07/15/19 2348 Oral     SpO2 07/15/19 2348 97 %     Weight 07/15/19 2322 77.6 kg (171 lb)     Height 07/15/19 2322 1.6 m (5\' 3" )     Head Circumference --      Peak Flow --      Pain Score 07/15/19 2321 10     Pain Loc --      Pain Edu? --      Excl. in [place]? --     Constitutional: Alert and oriented.  Eyes: Conjunctivae are normal.  Head: Atraumatic. Mouth/Throat: Patient is wearing a mask. Neck: No stridor.  No meningeal  signs.   Cardiovascular: Normal rate, regular rhythm. Good peripheral circulation. Grossly normal heart sounds. Respiratory: Normal respiratory effort.  No retractions. Gastrointestinal: Soft and nontender. No distention.  Musculoskeletal: Apparent lateral deformity of the left elbow.  Pain with palpation pain with active and passive range of motion.  No overlying skin changes Neurologic:  Normal speech and language. No gross focal neurologic deficits are appreciated.  Skin:  Skin is warm, dry and intact. Psychiatric: Mood and affect are normal. Speech and behavior are normal.    RADIOLOGY I, Taopi N Shahin Knierim, personally viewed and evaluated these images (plain radiographs) as part of my medical decision making, as well as reviewing the written report by the radiologist.  ED MD interpretation: Nonunion of prior radial head fracture progressive widening of the ulnar trochlear joint possible joint effusion with some remodeling of the articular surface of the olecranon and lateral humerus  Official radiology report(s): DG Elbow Complete Left  Result Date: 07/16/2019 CLINICAL DATA:  Chronic left elbow pain since fracture last year. EXAM: LEFT ELBOW - COMPLETE 3+ VIEW COMPARISON:  Radiograph 04/03/2018 FINDINGS: Nonunion of prior radial head fracture. Progressive widening of the ulnar trochlear joint. Subchondral cystic change involving the coronoid and lateral humeral condyle. Possible joint effusion.  There is an olecranon spur. IMPRESSION: 1. Nonunion of prior radial head fracture. 2. Progressive widening of the ulnar trochlear joint. Possible joint effusion. There is some remodeling at the articular surface of the olecranon and the lateral humerus, likely due to altered mechanics. 3. Olecranon spur. Electronically Signed   By: Narda Rutherford M.D.   On: 07/16/2019 03:19      Injection of joint  Date/Time: 07/16/2019 4:18 AM Performed by: Darci Current, MD Authorized by: Darci Current, MD  Preparation: Patient was prepped and draped in the usual sterile fashion. Local anesthesia used: yes  Anesthesia: Local anesthesia used: yes Local Anesthetic: lidocaine 1% without epinephrine Anesthetic total: 10 mL  Sedation: Patient sedated: no       ____________________________________________   INITIAL IMPRESSION / MDM / ASSESSMENT AND PLAN / ED COURSE  As part of my medical decision making, I reviewed the following data within the electronic MEDICAL RECORD NUMBER   64 year old female presented with above-stated history and physical exam a differential diagnosis including but not limited to nonunion following fracture of the left elbow.  X-ray was performed which confirmed the beforementioned.  Patient received a joint injection in the elbow of Depo-Medrol and 1% lidocaine as dictated above.  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 She was evaluated in the context of the global COVID-19 pandemic, which necessitated consideration that the patient might be at risk for infection with the SARS-CoV-2 virus that causes COVID-19. Institutional protocols and algorithms that pertain to the evaluation of patients at risk for COVID-19 are in a state of rapid change based on information released by regulatory bodies including the CDC and federal and state organizations. These policies and  algorithms were followed during the patient's care in the ED.  Some ED evaluations and interventions may be delayed as a result of limited staffing during and after the pandemic.*  Note:  This document was prepared using Dragon voice recognition software and may include unintentional dictation errors.   Darci Current, MD 07/16/19 [phone]

## 2019-07-16 NOTE — ED Notes (Addendum)
Pt reports taking two extra strength tylenol approx 30 min ago, medication brought from home. Pt advised to not take anything without MD approval while here, as it could affect medications given

## 2019-07-16 NOTE — ED Notes (Addendum)
Pt presents with swelling to left elbow. Pt reports pain radiates from left elbow to shoulder and forearm. Pt reports she has not had any injections into her elbow recently, and does not remember what they gave her before  Pt also reports chronic back pain

## 2019-08-20 ENCOUNTER — Emergency Department: Payer: Medicaid Other

## 2019-08-20 ENCOUNTER — Encounter: Payer: Self-pay | Admitting: Emergency Medicine

## 2019-08-20 ENCOUNTER — Emergency Department
Admission: EM | Admit: 2019-08-20 | Discharge: 2019-08-20 | Disposition: A | Discharge status: Home / Self Care | Payer: Medicaid Other | Attending: Emergency Medicine | Admitting: Emergency Medicine

## 2019-08-20 ENCOUNTER — Other Ambulatory Visit: Payer: Self-pay

## 2019-08-20 DIAGNOSIS — I10 Essential (primary) hypertension: Secondary | ICD-10-CM | POA: Diagnosis not present

## 2019-08-20 DIAGNOSIS — Z87891 Personal history of nicotine dependence: Secondary | ICD-10-CM | POA: Insufficient documentation

## 2019-08-20 DIAGNOSIS — M545 Low back pain: Secondary | ICD-10-CM | POA: Diagnosis not present

## 2019-08-20 DIAGNOSIS — G8929 Other chronic pain: Secondary | ICD-10-CM | POA: Insufficient documentation

## 2019-08-20 DIAGNOSIS — Z79899 Other long term (current) drug therapy: Secondary | ICD-10-CM | POA: Diagnosis not present

## 2019-08-20 MED ORDER — LIDOCAINE 5 % EX PTCH: 1.0000 | MEDICATED_PATCH | CUTANEOUS | 0 refills | Status: DC

## 2019-08-20 MED ORDER — KETOROLAC TROMETHAMINE 30 MG/ML IJ SOLN
30.0000 mg | Freq: Once | INTRAMUSCULAR | Status: AC
  Administered 2019-08-20: 30 mg via INTRAMUSCULAR
  Filled 2019-08-20: qty 1

## 2019-08-20 MED ORDER — ORPHENADRINE CITRATE 30 MG/ML IJ SOLN
60.0000 mg | Freq: Two times a day (BID) | INTRAMUSCULAR | Status: DC
  Administered 2019-08-20: 60 mg via INTRAMUSCULAR
  Filled 2019-08-20: qty 2

## 2019-08-20 MED ORDER — LIDOCAINE 5 % EX PTCH
1.0000 | MEDICATED_PATCH | CUTANEOUS | Status: DC
  Administered 2019-08-20: 1 via TRANSDERMAL
  Filled 2019-08-20: qty 1

## 2019-08-20 NOTE — ED Provider Notes (Signed)
[facility] Emergency Department Provider Note  ____________________________________________  Time seen: Approximately 4:36 PM  I have reviewed the triage vital signs and the nursing notes.   HISTORY  Chief Complaint Back Pain    HPI Beth Parker is a 64 y.o. female that presents to the emergency department for chronic low back pain for 28 years. Pain improves with rest and is worse with movement.  Pain is the same as her chronic back pain and has not changed in character.  She also occasionally has pain to her left elbow and bilateral knees. She has an appointment next Friday for her low back pain with PT. she accidentally showed up today and they informed her that her appointment was 1 week away still.  She is supposed to follow up with the pain clinic but has not heard back from them.  She would like another referral for the pain clinic.  She was diagnosed with sciatica this year.  No bowel or bladder dysfunction or saddle anesthesias.  No trauma. No urinary symptoms.    Past Medical History:  Diagnosis Date  . Anxiety   . Arthritis    knee and back, h/o chronic L1 fracture  . Back pain    h/o L1 fracture  . Depression   . Fractured elbow   . GERD (gastroesophageal reflux disease)   . High cholesterol   . Hypertension   . Migraine   . Rib fracture    R 5th, 01/2011    Patient Active Problem List   Diagnosis Date Noted  . Hyperglycemia 10/16/2011  . Shoulder pain 05/17/2011  . Migraine 04/25/2011  . Fracture of rib of right side 02/07/2011  . 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 Breast mass, right 06/13/2010  . BACK PAIN, CHRONIC 12/31/2006  . HYPERCHOLESTEROLEMIA 07/31/2006  . STATE, SYMPTOMATIC MENOPAUSE/FEM CLIMACTERIC 06/27/2006  . Anxiety 06/26/2006  . Depression, recurrent (HCC) 06/26/2006  . HTN (hypertension) 06/26/2006  . HEMORRHOIDS 06/26/2006  . GERD 06/26/2006    Past Surgical History:  Procedure Laterality Date  .  ABDOMINAL HYSTERECTOMY  1986   Right ovary remains  . ANKLE SURGERY  2005  . BREAST BIOPSY  08-23-2010   right (benign)  . CERVICAL DISC SURGERY  1994  . CESAREAN SECTION    . COLONOSCOPY N/A 05/30/2014   Procedure: COLONOSCOPY;  Surgeon: Wallace Cullens, MD;  Location: [facility] ENDOSCOPY;  Service: Gastroenterology;  Laterality: N/A;  . ESOPHAGOGASTRODUODENOSCOPY N/A 05/30/2014   Procedure: ESOPHAGOGASTRODUODENOSCOPY (EGD);  Surgeon: Wallace Cullens, MD;  Location: [facility] ENDOSCOPY;  Service: Gastroenterology;  Laterality: N/A;  . HEMORRHOID SURGERY     internal and external  . TUBAL LIGATION    . WRIST SURGERY     right (ganglion cyst)    Prior to Admission medications   Medication Sig Start Date End Date Taking? Authorizing Provider  Acetaminophen (TYLENOL PO) Take by mouth as needed. 500 mg tablet. Taking 9 tablets a day    [provider]  albuterol (VENTOLIN HFA) 108 (90 Base) MCG/ACT inhaler Inhale 2 puffs into the lungs every 6 (six) hours as needed for wheezing or shortness of breath. 01/28/19   Particia Nearing, PA-C  amLODipine (NORVASC) 10 MG tablet Take 10 mg by mouth daily.    [provider]  Docusate Sodium (STOOL SOFTENER LAXATIVE PO) Take by mouth 2 (two) times daily.    [provider]  Doxylamine Succinate, Sleep, (SLEEP AID PO) Take by mouth daily.    [provider]  estradiol (ESTRACE) 1 MG tablet Take by mouth daily.  06/19/18   [provider]  FLUoxetine (PROZAC) 20 MG tablet Take 1 tablet (20 mg total) by mouth daily. 12/25/18   Particia Nearing, PA-C  lidocaine (LIDODERM) 5 % Place 1 patch onto the skin daily. Remove & Discard patch within 12 hours or as directed by MD 08/20/19   Enid Derry, PA-C  lisinopril (ZESTRIL) 40 MG tablet Take 40 mg by mouth daily.    [provider]  metoprolol succinate (TOPROL-XL) 100 MG 24 hr tablet Take 1 tablet (100 mg total) by mouth daily. Take with or immediately following a meal.  10/15/11   Joaquim Nam, MD  omeprazole (PRILOSEC) 40 MG capsule Take 40 mg by mouth daily.    [provider]  Vitamin D, Ergocalciferol, (DRISDOL) 1.25 MG (50000 UT) CAPS capsule TAKE ONE CAPSULE BY MOUTH ONCE A WEEK 06/26/18   [provider]    Allergies Fluphenazine  Family History  Problem Relation Age of Onset  . Hypertension Mother   . Alzheimer's disease Mother   . Cancer Father        prostate  . Mental illness Father   . Mental illness Sister   . Breast cancer Sister   . Cancer Sister        breast cancer  . Colon cancer Neg Hx     Social History Social History   Tobacco Use  . Smoking status: Former Games developer  . Smokeless tobacco: Never Used  . Tobacco comment: quit over 8 years ago  Vaping Use  . Vaping Use: Never used  Substance Use Topics  . Alcohol use: No  . Drug use: No     Review of Systems  Cardiovascular: No chest pain. Respiratory: No SOB. Gastrointestinal: No abdominal pain.  No nausea, no vomiting.  Musculoskeletal: Positive for low back pain. Skin: Negative for rash, abrasions, lacerations, ecchymosis. Neurological: Negative for headaches, numbness or tingling   ____________________________________________   PHYSICAL EXAM:  VITAL SIGNS: ED Triage Vitals  Enc Vitals Group     BP 08/20/19 1350 (!) 151/70     Pulse Rate 08/20/19 1350 56     Resp 08/20/19 1350 16     Temp 08/20/19 1350 98.2 F (36.8 C)     Temp Source 08/20/19 1350 Oral     SpO2 08/20/19 1350 97 %     Weight --      Height --      Head Circumference --      Peak Flow --      Pain Score 08/20/19 1351 10     Pain Loc --      Pain Edu? --      Excl. in [place]? --      Constitutional: Alert and oriented. Well appearing and in no acute distress. Eyes: Conjunctivae are normal. PERRL. EOMI. Head: Atraumatic. ENT:      Ears:      Nose: No congestion/rhinnorhea.      Mouth/Throat: Mucous membranes are moist.  Neck: No stridor. Cardiovascular:  Normal rate, regular rhythm.  Good peripheral circulation. Respiratory: Normal respiratory effort without tachypnea or retractions. Lungs CTAB. Good air entry to the bases with no decreased or absent breath sounds. Gastrointestinal: Bowel sounds 4 quadrants. Soft and nontender to palpation. No guarding or rigidity. No palpable masses. No distention.  Musculoskeletal: Full range of motion to all extremities. No gross deformities appreciated.  Minimal tenderness to palpation throughout lumbar  spine and lumbar paraspinal muscles.  Strength equal in lower extremities bilaterally.  Normal gait. Neurologic:  Normal speech and language. No gross focal neurologic deficits are appreciated.  Skin:  Skin is warm, dry and intact. No rash noted. Psychiatric: Mood and affect are normal. Speech and behavior are normal. Patient exhibits appropriate insight and judgement.   ____________________________________________   LABS (all labs ordered are listed, but only abnormal results are displayed)  Labs Reviewed - No data to display ____________________________________________  EKG   ____________________________________________  RADIOLOGY  ____________________________________________    PROCEDURES  Procedure(s) performed:    Procedures    Medications  orphenadrine (NORFLEX) injection 60 mg (60 mg Intramuscular Given 08/20/19 1729)  lidocaine (LIDODERM) 5 % 1 patch (1 patch Transdermal Patch Applied 08/20/19 1729)  ketorolac (TORADOL) 30 MG/ML injection 30 mg (30 mg Intramuscular Given 08/20/19 1729)     ____________________________________________   INITIAL IMPRESSION / ASSESSMENT AND PLAN / ED COURSE  Pertinent labs & imaging results that were available during my care of the patient were reviewed by me and considered in my medical decision making (see chart for details).  Review of the [place] CSRS was performed in accordance of the [facility] prior to dispensing any controlled  drugs.   Patient presented to the emergency department for evaluation of chronic low back pain.  Vital signs and exam are reassuring.  Patient was given a dose of IM Toradol and Norflex for pain.  Patient will be discharged home with prescriptions for Lidoderm patch. Patient is to follow up with pain clinic and primary care as directed. Patient is given ED precautions to return to the ED for any worsening or new symptoms.   Beth Parker was evaluated in Emergency Department on 08/20/2019 for the symptoms described in the history of present illness. She was evaluated in the context of the global COVID-19 pandemic, which necessitated consideration that the patient might be at risk for infection with the SARS-CoV-2 virus that causes COVID-19. Institutional protocols and algorithms that pertain to the evaluation of patients at risk for COVID-19 are in a state of rapid change based on information released by regulatory bodies including the CDC and federal and state organizations. These policies and algorithms were followed during the patient's care in the ED.  ____________________________________________  FINAL CLINICAL IMPRESSION(S) / ED DIAGNOSES  Final diagnoses:  Chronic bilateral low back pain without sciatica      NEW MEDICATIONS STARTED DURING THIS VISIT:  ED Discharge Orders         Ordered    lidocaine (LIDODERM) 5 %  Every 24 hours     Discontinue  Reprint     08/20/19 1814              This chart was dictated using voice recognition software/Dragon. Despite best efforts to proofread, errors can occur which can change the meaning. Any change was purely unintentional.    Enid Derry, PA-C 08/20/19 2239    Gilles Chiquito, MD 08/21/19 [phone]

## 2019-08-20 NOTE — ED Notes (Signed)
See triage note  Presents with generalized pain  States she has had back injury and surgery  And also has had a broken arm over the past 28 years  Denies any new injury  But states she thinks she is doing too much    Ambulates well

## 2019-08-20 NOTE — ED Triage Notes (Signed)
Pt to ED via POV for chronic back pain. Pt states that she has had pain for over 28 years. When asked when the pain got worse pt states within the last month. Pt states that she went for PT appt today at [facility] but was told that her appt was not until next week. Pt states that she has been told that she needed to go to the pain clinic but she has not made the appt yet.

## 2019-08-21 NOTE — ED Provider Notes (Signed)
Patient called this morning stating that she cannot afford the Lidoderm patches that were called in to her pharmacy as they are over $100.  Patient has no other medication to take. Was given Norflex and Toradol IM while in the ED yesterday.  Patient is requesting medication to her pharmacy that she can afford.  Both Norflex and naproxen was called into her pharmacy at [facility].   Tommi Rumps, PA-C 08/21/19 1032    Delton Prairie, MD 08/21/19 [phone]

## 2020-01-27 ENCOUNTER — Other Ambulatory Visit: Payer: Self-pay | Admitting: Family Medicine

## 2020-01-27 DIAGNOSIS — Z1231 Encounter for screening mammogram for malignant neoplasm of breast: Secondary | ICD-10-CM

## 2020-03-21 ENCOUNTER — Other Ambulatory Visit: Payer: Self-pay

## 2020-03-21 ENCOUNTER — Ambulatory Visit
Admission: RE | Admit: 2020-03-21 | Discharge: 2020-03-21 | Disposition: A | Discharge status: Home / Self Care | Payer: Medicaid Other | Source: Ambulatory Visit | Attending: Family Medicine | Admitting: Family Medicine

## 2020-03-21 DIAGNOSIS — Z1231 Encounter for screening mammogram for malignant neoplasm of breast: Secondary | ICD-10-CM | POA: Diagnosis present

## 2020-12-17 ENCOUNTER — Other Ambulatory Visit: Payer: Self-pay

## 2020-12-17 ENCOUNTER — Emergency Department: Payer: Medicaid Other

## 2020-12-17 DIAGNOSIS — I10 Essential (primary) hypertension: Secondary | ICD-10-CM | POA: Insufficient documentation

## 2020-12-17 DIAGNOSIS — R0789 Other chest pain: Secondary | ICD-10-CM | POA: Insufficient documentation

## 2020-12-17 DIAGNOSIS — R079 Chest pain, unspecified: Secondary | ICD-10-CM | POA: Diagnosis present

## 2020-12-17 DIAGNOSIS — M546 Pain in thoracic spine: Secondary | ICD-10-CM | POA: Insufficient documentation

## 2020-12-17 DIAGNOSIS — Z79899 Other long term (current) drug therapy: Secondary | ICD-10-CM | POA: Diagnosis not present

## 2020-12-17 DIAGNOSIS — Z87891 Personal history of nicotine dependence: Secondary | ICD-10-CM | POA: Insufficient documentation

## 2020-12-17 LAB — TROPONIN I (HIGH SENSITIVITY): Troponin I (High Sensitivity): 5 ng/L (ref <18)

## 2020-12-17 LAB — CBC
HCT: 35.6 % — ABNORMAL LOW (ref 36.0–46.0)
Hemoglobin: 11.8 g/dL — ABNORMAL LOW (ref 12.0–15.0)
MCH: 31.5 pg (ref 26.0–34.0)
MCHC: 33.1 g/dL (ref 30.0–36.0)
MCV: 94.9 fL (ref 80.0–100.0)
Platelets: 199 10*3/uL (ref 150–400)
RBC: 3.75 MIL/uL — ABNORMAL LOW (ref 3.87–5.11)
RDW: 12.1 % (ref 11.5–15.5)
WBC: 12.7 10*3/uL — ABNORMAL HIGH (ref 4.0–10.5)
nRBC: 0 % (ref 0.0–0.2)

## 2020-12-17 LAB — BASIC METABOLIC PANEL
Anion gap: 7 (ref 5–15)
BUN: 16 mg/dL (ref 8–23)
CO2: 25 mmol/L (ref 22–32)
Calcium: 9 mg/dL (ref 8.9–10.3)
Chloride: 104 mmol/L (ref 98–111)
Creatinine, Ser: 0.85 mg/dL (ref 0.44–1.00)
GFR, Estimated: >60 mL/min (ref >60)
Glucose, Bld: 102 mg/dL — ABNORMAL HIGH (ref 70–99)
Potassium: 3.7 mmol/L (ref 3.5–5.1)
Sodium: 136 mmol/L (ref 135–145)

## 2020-12-17 MED ORDER — ACETAMINOPHEN 325 MG PO TABS
ORAL_TABLET | ORAL | Status: AC
  Filled 2020-12-17: qty 2

## 2020-12-17 MED ORDER — ACETAMINOPHEN 325 MG PO TABS: 650.0000 mg | ORAL_TABLET | Freq: Once | ORAL | Status: DC

## 2020-12-17 NOTE — ED Triage Notes (Signed)
Pt BIB EMS due to having back pain that radiates into her chest and right arm. Per EMS, pt diaphoretic on their arrival. Pt denies n/v. Pt received 324mg  of Asprin from EMS.    CBG 95 BP 124/78 HR 65

## 2020-12-18 ENCOUNTER — Emergency Department
Admission: EM | Admit: 2020-12-18 | Discharge: 2020-12-18 | Disposition: A | Discharge status: Home / Self Care | Payer: Medicaid Other | Attending: Emergency Medicine | Admitting: Emergency Medicine

## 2020-12-18 DIAGNOSIS — I10 Essential (primary) hypertension: Secondary | ICD-10-CM

## 2020-12-18 DIAGNOSIS — R0789 Other chest pain: Secondary | ICD-10-CM

## 2020-12-18 LAB — TROPONIN I (HIGH SENSITIVITY): Troponin I (High Sensitivity): 3 ng/L (ref <18)

## 2020-12-18 MED ORDER — LIDOCAINE 5 % EX PTCH: 1.0000 | MEDICATED_PATCH | Freq: Two times a day (BID) | CUTANEOUS | 0 refills | Status: DC

## 2020-12-18 NOTE — ED Provider Notes (Signed)
[facility] Emergency Department Provider Note  ____________________________________________  Time seen: Approximately 7:38 AM  I have reviewed the triage vital signs and the nursing notes.   HISTORY  Chief Complaint Chest Pain    HPI Beth Parker is a 65 y.o. female with a past history of anxiety, arthritis, back surgery, hypertension who comes ED complaining of upper back pain which radiates to her anterior chest which started 2 days ago.  Gradual onset, mild to moderate intensity, intermittent.  Worse with movement and changing position.  Not exertional.  Not pleuritic.  No shortness of breath.  No vomiting.  EMS report diaphoresis on arrival.  Through a waiting time of about 10 hours in the ED waiting room, patient reports the chest pain is improved.    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   Breast mass, right 06/13/2010   BACK PAIN, CHRONIC 12/31/2006   HYPERCHOLESTEROLEMIA 07/31/2006   STATE, SYMPTOMATIC MENOPAUSE/FEM CLIMACTERIC 06/27/2006   Anxiety 06/26/2006   Depression, recurrent (HCC) 06/26/2006   HTN (hypertension) 06/26/2006   HEMORRHOIDS 06/26/2006   GERD 06/26/2006     Past Surgical History:  Procedure Laterality Date   ABDOMINAL HYSTERECTOMY  1986   Right ovary remains   ANKLE SURGERY  2005   BREAST BIOPSY  08-23-2010   right (benign)   CERVICAL DISC SURGERY  1994   CESAREAN SECTION     COLONOSCOPY N/A 05/30/2014   Procedure: COLONOSCOPY;  Surgeon:  Wallace Cullens, MD;  Location: [facility] ENDOSCOPY;  Service: Gastroenterology;  Laterality: N/A;   ESOPHAGOGASTRODUODENOSCOPY N/A 05/30/2014   Procedure: ESOPHAGOGASTRODUODENOSCOPY (EGD);  Surgeon: Wallace Cullens, MD;  Location: [facility] ENDOSCOPY;  Service: Gastroenterology;  Laterality: N/A;   HEMORRHOID SURGERY     internal and external   TUBAL LIGATION     WRIST SURGERY     right (ganglion cyst)     Prior to Admission medications   Medication Sig Start Date End Date Taking? Authorizing Provider  lidocaine (LIDODERM) 5 % Place 1 patch onto the skin every 12 (twelve) hours. Remove & Discard patch within 12 hours or as directed by MD 12/18/20  Yes Sharman Cheek, MD  Acetaminophen (TYLENOL PO) Take by mouth as needed. 500 mg tablet. Taking 9 tablets a day    [provider]  albuterol (VENTOLIN HFA) 108 (90 Base) MCG/ACT inhaler Inhale 2 puffs into the lungs every 6 (six) hours as needed for wheezing or shortness of breath. 01/28/19   Particia Nearing, PA-C  amLODipine (NORVASC) 10 MG tablet Take 10 mg by mouth daily.    [provider]  Docusate Sodium (STOOL SOFTENER LAXATIVE PO) Take by mouth 2 (two) times daily.    [provider]  Doxylamine Succinate, Sleep, (SLEEP AID PO) Take by mouth daily.    [provider]  estradiol (ESTRACE) 1 MG tablet Take by mouth daily.  06/19/18   [provider]  FLUoxetine (PROZAC) 20 MG tablet Take 1 tablet (20 mg total) by mouth daily. 12/25/18   Particia Nearing, PA-C  lisinopril (ZESTRIL) 40 MG tablet Take 40 mg by mouth daily.    [provider]  metoprolol succinate (TOPROL-XL) 100 MG 24 hr tablet Take 1 tablet (100 mg total) by mouth daily. Take with or immediately following a meal. 10/15/11   Joaquim Nam, MD  omeprazole (PRILOSEC) 40 MG capsule Take 40 mg by mouth daily.    [provider]  Vitamin D, Ergocalciferol, (DRISDOL) 1.25 MG (50000 UT) CAPS capsule TAKE ONE CAPSULE BY MOUTH ONCE  A WEEK 06/26/18   [provider]     Allergies Fluphenazine   Family History  Problem Relation Age of Onset   Hypertension Mother    Alzheimer's disease Mother    Cancer Father        prostate   Mental illness Father    Mental illness Sister    Breast cancer Sister 39   Cancer Sister        breast cancer   Colon cancer Neg Hx     Social History Social History   Tobacco Use   Smoking status: Former   Smokeless tobacco: Never   Tobacco comments:    quit over 8 years ago  Vaping Use   Vaping Use: Never used  Substance Use Topics   Alcohol use: No   Drug use: No    Review of Systems  Constitutional:   No fever or chills.  ENT:   No sore throat. No rhinorrhea. Cardiovascular:   Positive chest pain as above without syncope. Respiratory:   No dyspnea or cough. Gastrointestinal:   Negative for abdominal pain, vomiting and diarrhea.  Musculoskeletal:   Negative for focal pain or swelling All other systems reviewed and are negative except as documented above in ROS and HPI.  ____________________________________________   PHYSICAL EXAM:  VITAL SIGNS: ED Triage Vitals  Enc Vitals Group     BP 12/17/20 2054 (!) 159/74     Pulse Rate 12/17/20 2054 62     Resp 12/17/20 2054 20     Temp 12/17/20 2054 98.6 F (37 C)     Temp Source 12/17/20 2054 Oral     SpO2 12/17/20 2054 95 %     Weight 12/17/20 2055 190 lb (86.2 kg)     Height 12/17/20 2055 5\' 3"  (1.6 m)     Head Circumference --      Peak Flow --      Pain Score 12/17/20 2055 10     Pain Loc --      Pain Edu? --      Excl. in GC? --     Vital signs reviewed, nursing assessments reviewed.   Constitutional:   Alert and oriented. Non-toxic appearance. Eyes:   Conjunctivae are normal. EOMI. PERRL. ENT      Head:   Normocephalic and atraumatic.      Nose:   Wearing a mask.      Mouth/Throat:   Wearing a mask.      Neck:   No meningismus. Full ROM. Hematological/Lymphatic/Immunilogical:   No  cervical lymphadenopathy. Cardiovascular:   RRR. Symmetric bilateral radial and DP pulses.  No murmurs. Cap refill less than 2 seconds. Respiratory:   Normal respiratory effort without tachypnea/retractions. Breath sounds are clear and equal bilaterally. No wheezes/rales/rhonchi. Gastrointestinal:   Soft and nontender. Non distended. There is no  CVA tenderness.  No rebound, rigidity, or guarding. Genitourinary:   deferred Musculoskeletal:   Normal range of motion in all extremities. No joint effusions.  No lower extremity tenderness.  No edema.  Symmetric calf circumference.  Negative Homans' sign.  In the paraspinal musculature of the right side thoracic back, there is focal area of muscular tension and tenderness to palpation which reproduces her back pain.  Additionally, the right anterior chest at the sternal margin is tender to palpation reproducing her anterior chest pain. Neurologic:   Normal speech and language.  Motor grossly intact. No acute focal neurologic deficits are appreciated.  Skin:    Skin is warm, dry and intact. No rash noted.  No evidence of shingles.  No petechiae, purpura, or bullae.  ____________________________________________    LABS (pertinent positives/negatives) (all labs ordered are listed, but only abnormal results are displayed) Labs Reviewed  BASIC METABOLIC PANEL - Abnormal; Notable for the following components:      Result Value   Glucose, Bld 102 (*)    All other components within normal limits  CBC - Abnormal; Notable for the following components:   WBC 12.7 (*)    RBC 3.75 (*)    Hemoglobin 11.8 (*)    HCT 35.6 (*)    All other components within normal limits  TROPONIN I (HIGH SENSITIVITY)  TROPONIN I (HIGH SENSITIVITY)   ____________________________________________   EKG  Interpreted by me Sinus rhythm rate of 64, normal axis, slightly short PR interval of 110 ms.  Normal QRS ST segments and T waves.  No delta waves or other signs of  underlying dysrhythmia.  No ischemic changes.  ____________________________________________    RADIOLOGY  DG Chest 2 View  Result Date: 12/17/2020 CLINICAL DATA:  Chest pain. EXAM: CHEST - 2 VIEW COMPARISON:  Chest radiograph dated 08/20/2019. FINDINGS: No focal consolidation, pleural effusion, pneumothorax. The cardiac silhouette is within limits. No acute osseous pathology. Old right clavicular fracture. IMPRESSION: No active cardiopulmonary disease. Electronically Signed   By: Elgie Collard M.D.   On: 12/17/2020 21:20    ____________________________________________   PROCEDURES Procedures  ____________________________________________  DIFFERENTIAL DIAGNOSIS   Musculoskeletal pain, non-STEMI, pneumonia, pleural effusion  CLINICAL IMPRESSION / ASSESSMENT AND PLAN / ED COURSE  Medications ordered in the ED: Medications  acetaminophen (TYLENOL) tablet 650 mg (650 mg Oral Not Given 12/17/20 2224)    Pertinent labs & imaging results that were available during my care of the patient were reviewed by me and considered in my medical decision making (see chart for details).  Beth Parker was evaluated in Emergency Department on 12/18/2020 for the symptoms described in the history of present illness. She was evaluated in the context of the global COVID-19 pandemic, which necessitated consideration that the patient might be at risk for infection with the SARS-CoV-2 virus that causes COVID-19. Institutional protocols and algorithms that pertain to the evaluation of patients at risk for COVID-19 are in a state of rapid change based on information released by regulatory bodies including the CDC and federal and state organizations. These policies and algorithms were followed during the patient's care in the ED.   Patient presents with atypical chest pain in the right side thoracic back and right parasternal anterior chest.  This is reproducible on exam and with muscular movements.  EKG,  chest x-ray, labs are all unremarkable.  She is nontoxic without exertional or pleuritic symptoms.   Considering the patient's symptoms, medical history, and physical examination today, I have low suspicion  for ACS, PE, TAD, pneumothorax, carditis, mediastinitis, pneumonia, CHF, or sepsis. Stable for discharge home.  Pressures been elevated here.  She notes that she was recently started on this by her doctor but only takes it intermittently because it makes her have to go to the bathroom frequently.  Recommended she increase compliance with this medication and follow-up with her doctor in 1 to 2 weeks for reassessment of blood pressure.      ____________________________________________   FINAL CLINICAL IMPRESSION(S) / ED DIAGNOSES    Final diagnoses:  Chest wall pain  Essential hypertension     ED Discharge Orders          Ordered    lidocaine (LIDODERM) 5 %  Every 12 hours        12/18/20 0738            Portions of this note were generated with dragon dictation software. Dictation errors may occur despite best attempts at proofreading.    Sharman Cheek, MD 12/18/20 [phone]

## 2021-03-23 ENCOUNTER — Ambulatory Visit: Payer: Medicaid Other | Admitting: Orthopedic Surgery

## 2021-03-30 ENCOUNTER — Ambulatory Visit (INDEPENDENT_AMBULATORY_CARE_PROVIDER_SITE_OTHER): Payer: Medicaid Other | Admitting: Orthopedic Surgery

## 2021-03-30 ENCOUNTER — Ambulatory Visit: Payer: Self-pay

## 2021-03-30 ENCOUNTER — Other Ambulatory Visit: Payer: Self-pay

## 2021-03-30 DIAGNOSIS — M25522 Pain in left elbow: Secondary | ICD-10-CM

## 2021-03-30 DIAGNOSIS — M12522 Traumatic arthropathy, left elbow: Secondary | ICD-10-CM | POA: Diagnosis not present

## 2021-03-30 NOTE — Progress Notes (Signed)
? ?Office Visit Note ?  ?Patient: Beth Parker           ?Date of Birth: Jul 24, 1955           ?MRN: [id number] ?Visit Date: 03/30/2021 ?             ?Requested by: Beth Crutch, PA ?[phone] ?[place],  [place] [address] ?PCP: Beth Crutch, PA ? ? ?Assessment & Plan: ?Visit Diagnoses:  ?1. Pain in left elbow   ?2. Traumatic arthritis elbow, left   ? ? 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  She has tried oral medications including Tylenol and meloxicam.  Her elbow is quite bothersome for her.  Discussed that one potential treatment option will be a total elbow arthroplasty.  Given the complex nature of her elbow arthritis, I would like to refer her to another surgeon who has more experience with complex elbow reconstruction.  I will work on finding an appropriate referral for her. ? ?Follow-Up Instructions: No follow-ups on file.  ? ?Orders:  ?Orders Placed This Encounter  ?Procedures  ? XR Elbow Complete Left (3+View)  ? ?No orders of the defined types were placed in this encounter. ? ? ? ? Procedures: ?No procedures performed ? ? ?Clinical Data: ?No additional findings. ? ? ?Subjective: ?Chief Complaint  ?Patient presents with  ? Left Elbow - Pain  ? ? ?Is a 66 year old right-hand-dominant female who presents with left elbow pain.  She notes that she had an injury several years ago in which she fell down concrete steps at her home.  She did not have insurance at that time and so her injury went untreated.  She has since developed progressively worsening pain in her left elbow.  Her pain is diffuse in the elbow and worse with certain activities.  She has difficulty with reaching above her head.  She does note some subjective elbow instability with a clunking sensation.  She has some days with minimal pain depending on her activity.  She has tried oral Tylenol and oral meloxicam with minimal symptom  relief. ? ? ?Review of Systems ? ? ?Objective: ?Vital Signs: There were no vitals taken for this visit. ? ?Physical Exam ?Constitutional:   ?   Appearance: Normal appearance.  ?Cardiovascular:  ?   Rate and Rhythm: Normal rate.  ?   Pulses: Normal pulses.  ?Pulmonary:  ?   Effort: Pulmonary effort is normal.  ?Skin: ?   General: Skin is warm and dry.  ?   Capillary Refill: Capillary refill takes less than 2 seconds.  ?Neurological:  ?   Mental Status: She is alert.  ? ? ?Left Elbow Exam  ? ?Tenderness  ?Left elbow tenderness location: TTP at radiocapitellar joint, medial ulnohumeral joint.  Moderate swelling.  ? ?Other  ?Erythema: absent ?Sensation: normal ?Pulse: present ? ?Comments:  Full extension with approximately 90-100 degrees flexion.  Near full pronation and supination.  Mild varus and valgus instability w/ palpable clunking.  ? ? ? ? ?Specialty Comments:  ?No specialty comments available. ? ?Imaging: ?No results found. ? ? ?PMFS History: ?Patient Active Problem List  ? Diagnosis Date Noted  ? Traumatic arthritis elbow, left 03/30/2021  ? Hyperglycemia 10/16/2011  ? Shoulder pain 05/17/2011  ? Migraine 04/25/2011  ? Fracture of rib of right side 02/07/2011  ? Sleep-wake cycle disorder 10/31/2010  ? Breast mass, right 06/13/2010  ? BACK PAIN, CHRONIC  12/31/2006  ? HYPERCHOLESTEROLEMIA 07/31/2006  ? STATE, SYMPTOMATIC MENOPAUSE/FEM CLIMACTERIC 06/27/2006  ? Anxiety 06/26/2006  ? Depression, recurrent (HCC) 06/26/2006  ? HTN (hypertension) 06/26/2006  ? HEMORRHOIDS 06/26/2006  ? GERD 06/26/2006  ? ?Past Medical History:  ?Diagnosis Date  ? Anxiety   ? Arthritis   ? knee and back, h/o chronic L1 fracture  ? Back pain   ? h/o L1 fracture  ? Depression   ? Fractured elbow   ? GERD (gastroesophageal reflux disease)   ? High cholesterol   ? Hypertension   ? Migraine   ? Rib fracture   ? R 5th, 01/2011  ?  ?Family History  ?Problem Relation Age of Onset  ? Hypertension Mother   ? Alzheimer's disease Mother   ? Cancer  Father   ?     prostate  ? Mental illness Father   ? Mental illness Sister   ? Breast cancer Sister 67  ? Cancer Sister   ?     breast cancer  ? Colon cancer Neg Hx   ?  ?Past Surgical History:  ?Procedure Laterality Date  ? ABDOMINAL HYSTERECTOMY  1986  ? Right ovary remains  ? ANKLE SURGERY  2005  ? BREAST BIOPSY  08-23-2010  ? right (benign)  ? CERVICAL DISC SURGERY  1994  ? CESAREAN SECTION    ? COLONOSCOPY N/A 05/30/2014  ? Procedure: COLONOSCOPY;  Surgeon: Wallace Cullens, MD;  Location: [facility] ENDOSCOPY;  Service: Gastroenterology;  Laterality: N/A;  ? ESOPHAGOGASTRODUODENOSCOPY N/A 05/30/2014  ? Procedure: ESOPHAGOGASTRODUODENOSCOPY (EGD);  Surgeon: Wallace Cullens, MD;  Location: [facility] ENDOSCOPY;  Service: Gastroenterology;  Laterality: N/A;  ? HEMORRHOID SURGERY    ? internal and external  ? TUBAL LIGATION    ? WRIST SURGERY    ? right (ganglion cyst)  ? ?Social History  ? ?Occupational History  ? Occupation: hairdresser, retired  ?Tobacco Use  ? Smoking status: Former  ? Smokeless tobacco: Never  ? Tobacco comments:  ?  quit over 8 years ago  ?Vaping Use  ? Vaping Use: Never used  ?Substance and Sexual Activity  ? Alcohol use: No  ? Drug use: No  ? Sexual activity: Not Currently  ? ? ? ? ? ? ?

## 2021-04-09 ENCOUNTER — Telehealth: Payer: Self-pay | Admitting: Orthopedic Surgery

## 2021-04-09 NOTE — Telephone Encounter (Signed)
I called and advised, that her information has been sent to [facility] and they have to review the information and they will call to schedule an appt. ?

## 2021-04-09 NOTE — Telephone Encounter (Signed)
Pt called and was wondering about her referral for her elbow?  ? ?CB [phone]  ?

## 2021-08-16 ENCOUNTER — Other Ambulatory Visit: Payer: Self-pay

## 2021-09-17 ENCOUNTER — Other Ambulatory Visit: Payer: Self-pay | Admitting: Family Medicine

## 2021-09-17 DIAGNOSIS — Z1231 Encounter for screening mammogram for malignant neoplasm of breast: Secondary | ICD-10-CM

## 2022-09-01 DIAGNOSIS — N3281 Overactive bladder: Secondary | ICD-10-CM | POA: Insufficient documentation

## 2022-09-01 DIAGNOSIS — E559 Vitamin D deficiency, unspecified: Secondary | ICD-10-CM | POA: Insufficient documentation

## 2022-09-12 ENCOUNTER — Ambulatory Visit: Payer: Medicare (Managed Care) | Admitting: Nurse Practitioner

## 2022-09-12 ENCOUNTER — Encounter: Payer: Self-pay | Admitting: Nurse Practitioner

## 2022-09-12 VITALS — BP 138/82 | HR 78 | Temp 97.9°F | Resp 18 | Ht 63.1 in | Wt 195.8 lb

## 2022-09-12 DIAGNOSIS — M25561 Pain in right knee: Secondary | ICD-10-CM

## 2022-09-12 DIAGNOSIS — Z87898 Personal history of other specified conditions: Secondary | ICD-10-CM | POA: Insufficient documentation

## 2022-09-12 DIAGNOSIS — E669 Obesity, unspecified: Secondary | ICD-10-CM | POA: Insufficient documentation

## 2022-09-12 DIAGNOSIS — Z114 Encounter for screening for human immunodeficiency virus [HIV]: Secondary | ICD-10-CM

## 2022-09-12 DIAGNOSIS — E782 Mixed hyperlipidemia: Secondary | ICD-10-CM

## 2022-09-12 DIAGNOSIS — K219 Gastro-esophageal reflux disease without esophagitis: Secondary | ICD-10-CM

## 2022-09-12 DIAGNOSIS — F5104 Psychophysiologic insomnia: Secondary | ICD-10-CM

## 2022-09-12 DIAGNOSIS — N3281 Overactive bladder: Secondary | ICD-10-CM

## 2022-09-12 DIAGNOSIS — F339 Major depressive disorder, recurrent, unspecified: Secondary | ICD-10-CM | POA: Diagnosis not present

## 2022-09-12 DIAGNOSIS — G8929 Other chronic pain: Secondary | ICD-10-CM

## 2022-09-12 DIAGNOSIS — E6609 Other obesity due to excess calories: Secondary | ICD-10-CM

## 2022-09-12 DIAGNOSIS — F419 Anxiety disorder, unspecified: Secondary | ICD-10-CM | POA: Diagnosis not present

## 2022-09-12 DIAGNOSIS — I10 Essential (primary) hypertension: Secondary | ICD-10-CM

## 2022-09-12 DIAGNOSIS — Z1159 Encounter for screening for other viral diseases: Secondary | ICD-10-CM

## 2022-09-12 MED ORDER — ESCITALOPRAM OXALATE 5 MG PO TABS: 5.0000 mg | ORAL_TABLET | Freq: Every day | ORAL | 4 refills | Status: DC

## 2022-09-12 MED ORDER — OMEPRAZOLE 40 MG PO CPDR: 40.0000 mg | DELAYED_RELEASE_CAPSULE | Freq: Every day | ORAL | 4 refills | Status: DC

## 2022-09-12 MED ORDER — BUSPIRONE HCL 10 MG PO TABS: 10.0000 mg | ORAL_TABLET | Freq: Two times a day (BID) | ORAL | 4 refills | Status: DC

## 2022-09-12 MED ORDER — OXYBUTYNIN CHLORIDE ER 10 MG PO TB24: 20.0000 mg | ORAL_TABLET | Freq: Every day | ORAL | 4 refills | Status: DC

## 2022-09-12 MED ORDER — METOPROLOL TARTRATE 100 MG PO TABS: 100.0000 mg | ORAL_TABLET | Freq: Two times a day (BID) | ORAL | 4 refills | Status: DC

## 2022-09-12 MED ORDER — ATORVASTATIN CALCIUM 40 MG PO TABS: 40.0000 mg | ORAL_TABLET | Freq: Every day | ORAL | 4 refills | Status: DC

## 2022-09-12 MED ORDER — BELSOMRA 5 MG PO TABS: 5.0000 mg | ORAL_TABLET | Freq: Every evening | ORAL | 2 refills | Status: DC | PRN

## 2022-09-12 MED ORDER — LISINOPRIL 40 MG PO TABS: 40.0000 mg | ORAL_TABLET | Freq: Every day | ORAL | 4 refills | Status: DC

## 2022-09-12 NOTE — Assessment & Plan Note (Signed)
Chronic, ongoing.  Currently taking Ditropan XL 20 MG daily.  Educated her on this medication and safe medications in patients 65 and up.  Would benefit from change to Mybetriq.  She does not want to change at this time.  Will continue Ditropan XL dosing for now, refills sent in.  May benefit pelvic floor therapy in future.

## 2022-09-12 NOTE — Assessment & Plan Note (Signed)
States she did have incidents when using drugs in past, but does not recall injury to head.  Monitor mood closely.

## 2022-09-12 NOTE — Patient Instructions (Signed)
Insomnia Insomnia is a sleep disorder that makes it difficult to fall asleep or stay asleep. Insomnia can cause fatigue, low energy, difficulty concentrating, mood swings, and poor performance at work or school. There are three different ways to classify insomnia: Difficulty falling asleep. Difficulty staying asleep. Waking up too early in the morning. Any type of insomnia can be long-term (chronic) or short-term (acute). Both are common. Short-term insomnia usually lasts for 3 months or less. Chronic insomnia occurs at least three times a week for longer than 3 months. What are the causes? Insomnia may be caused by another condition, situation, or substance, such as: Having certain mental health conditions, such as anxiety and depression. Using caffeine, alcohol, tobacco, or drugs. Having gastrointestinal conditions, such as gastroesophageal reflux disease (GERD). Having certain medical conditions. These include: Asthma. Alzheimer's disease. Stroke. Chronic pain. An overactive thyroid gland (hyperthyroidism). Other sleep disorders, such as restless legs syndrome and sleep apnea. Menopause. Sometimes, the cause of insomnia may not be known. What increases the risk? Risk factors for insomnia include: Gender. Females are affected more often than males. Age. Insomnia is more common as people get older. Stress and certain medical and mental health conditions. Lack of exercise. Having an irregular work schedule. This may include working night shifts and traveling between different time zones. What are the signs or symptoms? If you have insomnia, the main symptom is having trouble falling asleep or having trouble staying asleep. This may lead to other symptoms, such as: Feeling tired or having low energy. Feeling nervous about going to sleep. Not feeling rested in the morning. Having trouble concentrating. Feeling irritable, anxious, or depressed. How is this diagnosed? This condition  may be diagnosed based on: Your symptoms and medical history. Your health care provider may ask about: Your sleep habits. Any medical conditions you have. Your mental health. A physical exam. How is this treated? Treatment for insomnia depends on the cause. Treatment may focus on treating an underlying condition that is causing the insomnia. Treatment may also include: Medicines to help you sleep. Counseling or therapy. Lifestyle adjustments to help you sleep better. Follow these instructions at home: Eating and drinking  Limit or avoid alcohol, caffeinated beverages, and products that contain nicotine and tobacco, especially close to bedtime. These can disrupt your sleep. Do not eat a large meal or eat spicy foods right before bedtime. This can lead to digestive discomfort that can make it hard for you to sleep. Sleep habits  Keep a sleep diary to help you and your health care provider figure out what could be causing your insomnia. Write down: When you sleep. When you wake up during the night. How well you sleep and how rested you feel the next day. Any side effects of medicines you are taking. What you eat and drink. Make your bedroom a dark, comfortable place where it is easy to fall asleep. Put up shades or blackout curtains to block light from outside. Use a white noise machine to block noise. Keep the temperature cool. Limit screen use before bedtime. This includes: Not watching TV. Not using your smartphone, tablet, or computer. Stick to a routine that includes going to bed and waking up at the same times every day and night. This can help you fall asleep faster. Consider making a quiet activity, such as reading, part of your nighttime routine. Try to avoid taking naps during the day so that you sleep better at night. Get out of bed if you are still awake after   15 minutes of trying to sleep. Keep the lights down, but try reading or doing a quiet activity. When you feel  sleepy, go back to bed. General instructions Take over-the-counter and prescription medicines only as told by your health care provider. Exercise regularly as told by your health care provider. However, avoid exercising in the hours right before bedtime. Use relaxation techniques to manage stress. Ask your health care provider to suggest some techniques that may work well for you. These may include: Breathing exercises. Routines to release muscle tension. Visualizing peaceful scenes. Make sure that you drive carefully. Do not drive if you feel very sleepy. Keep all follow-up visits. This is important. Contact a health care provider if: You are tired throughout the day. You have trouble in your daily routine due to sleepiness. You continue to have sleep problems, or your sleep problems get worse. Get help right away if: You have thoughts about hurting yourself or someone else. Get help right away if you feel like you may hurt yourself or others, or have thoughts about taking your own life. Go to your nearest emergency room or: Call 911. Call the National Suicide Prevention Lifeline at 1-800-273-8255 or 988. This is open 24 hours a day. Text the Crisis Text Line at 741741. Summary Insomnia is a sleep disorder that makes it difficult to fall asleep or stay asleep. Insomnia can be long-term (chronic) or short-term (acute). Treatment for insomnia depends on the cause. Treatment may focus on treating an underlying condition that is causing the insomnia. Keep a sleep diary to help you and your health care provider figure out what could be causing your insomnia. This information is not intended to replace advice given to you by your health care provider. Make sure you discuss any questions you have with your health care provider. Document Revised: 12/18/2020 Document Reviewed: 12/18/2020 Elsevier Patient Education  2024 [employer]  

## 2022-09-12 NOTE — Assessment & Plan Note (Signed)
Chronic, ongoing.  Denies Si/HI.  Has strong faith in God.  Reports benefit from Xanax in past + that other medications thus far have not offered same benefit.  Educated her on SSRI and SNRI family of medications.  Has not tried Lexapro.  Will trial Lexapro 5 MG daily to start and adjust as needed.  Educated on medication + possible side effects.  Discussed BLACK BOX warning.  Will increase Buspar to 10 MG BID.  If poor control with trial of medications, will consider psychiatry referral in future.

## 2022-09-12 NOTE — Assessment & Plan Note (Signed)
Ongoing for several years per patient.  Will obtain imaging of right knee to further assess.  Recommend she take Tylenol at home as needed + may use Voltaren gel.  Gentle stretching daily and attempt to stay active.  Would benefit from modest weight loss.

## 2022-09-12 NOTE — Progress Notes (Signed)
New Patient Office Visit  Subjective    Patient ID: [name], female    DOB: 1955/03/05  Age: 67 y.o. MRN: [id number]  CC:  Chief Complaint  Patient presents with   Anxiety   Depression   Hyperlipidemia   Hypertension    HPI Beth Parker presents for new patient visit to establish care.  Introduced to Publishing rights manager role and practice setting.  All questions answered.  Discussed provider/patient relationship and expectations.  Was at [facility] for care prior to coming here.  She decided she wanted a change.  KNEE PAIN Has been ongoing for 3 years, since she fell on it.  Never had imaging.  The trailer she lived in had bad kitchen, floor was falling in.  No longer lives in this. She fell and hit right knee.    History of lumbar back surgery over 20 years ago, does have chronic pain with this. Duration: chronic Involved knee: right Mechanism of injury: trauma Location:medial Onset: gradual Severity: 5/10  Quality:  dull, aching, and throbbing Frequency: intermittent Radiation: no Aggravating factors: weight bearing, walking, and movement  Alleviating factors: APAP and rest  Status: fluctuating Treatments attempted: rest, APAP, and aleve  Relief with NSAIDs?:  mild Weakness with weight bearing or walking: no Sensation of giving way: yes Locking: no Popping: no Bruising: no Swelling: no Redness: no Paresthesias/decreased sensation: no Fevers: no   HYPERTENSION / HYPERLIPIDEMIA Taking Lisinopril 40 MG daily + Metoprolol 100 MG BID and Atorvastatin 40 MG daily for HLD.  Has been on this regimen for about 3 years.  They did try her on a diuretic, but this made her urinate too much.  Satisfied with current treatment? yes Duration of hypertension: chronic BP monitoring frequency: weekly BP range: varies BP medication side effects: no Duration of hyperlipidemia: chronic Cholesterol medication side effects: no Cholesterol supplements: none Medication  compliance: good compliance Aspirin: no Recent stressors: no Recurrent headaches: no Visual changes: no Palpitations: no Dyspnea: no Chest pain: no Lower extremity edema: no Dizzy/lightheaded: no   GERD Taking Omeprazole daily for heart burn. GERD control status: stable Satisfied with current treatment? yes Heartburn frequency: none Medication side effects: no  Medication compliance: stable Previous GERD medications: none Dysphagia: no Odynophagia:  no Hematemesis: no Blood in stool: no EGD: yes   WEIGHT GAIN She is interested in weight loss medications. Duration: chronic Previous attempts at weight loss: yes Complications of obesity: HTN/HLD Peak weight: 200 Weight loss goal:  130 range Weight loss to date: none Requesting obesity pharmacotherapy: yes Current weight loss supplements/medications: no Previous weight loss supplements/meds: no Calories: 1800  OAB Takes Oxybutynin XL for this, was told to take 2 a day, but she reports previous provider did not change this.  No incontinence, but does have Attends just in case.   Dysuria: no Urinary frequency: occasional Urgency: occasional Small volume voids: no Symptom severity: yes Urinary incontinence: no Foul odor: no Hematuria: no Abdominal pain: no Back pain: no Suprapubic pain/pressure: no Flank pain: no Fever:  no Vomiting: no Status: stable  DEPRESSION/ANXIETY Has panic attacks at baseline -- takes Buspar and Vistaril -- reports these do not work well.  Took Xanax in past she reports, prescribed by Dr. Dossie Parker years ago and this worked well for her -- "better than anything else".  Currently using Vistaril for sleep, takes 5 to get to sleep she reports and it does not work.  Her husband just passed March 27th, they had not been together  in 3 years. Has taken Trazodone in past without benefit for sleep + Melatonin + Ambien taken without benefit.  States she does not want to take anything addictive,  discussed with her that benzos are addictive and educated on other risks over 51.  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  Has not used in several years. Reports she has high tolerance to drugs due to this history.  Does not recall other medications she took.  History of TBI. Mood status: stable Satisfied with current treatment?: yes Symptom severity: moderate  Duration of current treatment : chronic Side effects: no Medication compliance: good compliance Psychotherapy/counseling: no in the past Previous psychiatric medications: multiple she does not recall Depressed mood: yes Anxious mood: yes Anhedonia: at times Significant weight loss or gain: no Insomnia: yes hard to fall asleep Fatigue: yes Feelings of worthlessness or guilt: no Impaired concentration/indecisiveness: no Suicidal ideations: no Hopelessness: no Crying spells: yes    09/12/2022    9:32 AM 12/25/2018    3:41 PM  Depression screen PHQ 2/9  Decreased Interest 1 0  Down, Depressed, Hopeless 1 3  PHQ - 2 Score 2 3  Altered sleeping 3 3  Tired, decreased energy 1 3  Change in appetite 1 0  Feeling bad or failure about yourself  1 0  Trouble concentrating 3 0  Moving slowly or fidgety/restless 0 0  Suicidal thoughts 0 0  PHQ-9 Score 11 9  Difficult doing work/chores Somewhat difficult       09/12/2022    9:33 AM  GAD 7 : Generalized Anxiety Score  Nervous, Anxious, on Edge 2  Control/stop worrying 0  Worry too much - different things 0  Trouble relaxing 1  Restless 0  Easily annoyed or irritable 0  Afraid - awful might happen 0  Total GAD 7 Score 3  Anxiety Difficulty Somewhat difficult   Outpatient Encounter Medications as of 09/12/2022  Medication Sig   Acetaminophen (TYLENOL PO) Take by mouth as needed. 500 mg tablet. Taking 9 tablets a day   busPIRone (BUSPAR) 10 MG tablet Take 1 tablet (10 mg total) by mouth 2 (two) times daily.    escitalopram (LEXAPRO) 5 MG tablet Take 1 tablet (5 mg total) by mouth daily.   hydrOXYzine (ATARAX) 25 MG tablet Take 25 mg by mouth every 8 (eight) hours as needed.   naproxen (NAPROSYN) 500 MG tablet Take 1 tablet by mouth 2 (two) times daily as needed.   oxybutynin (DITROPAN XL) 10 MG 24 hr tablet Take 2 tablets (20 mg total) by mouth at bedtime.   Suvorexant (BELSOMRA) 5 MG TABS Take 1 tablet (5 mg total) by mouth at bedtime as needed.   [DISCONTINUED] atorvastatin (LIPITOR) 40 MG tablet Take 40 mg by mouth daily.   [DISCONTINUED] busPIRone (BUSPAR) 5 MG tablet Take 5 mg by mouth 2 (two) times daily.   [DISCONTINUED] hydrochlorothiazide (HYDRODIURIL) 25 MG tablet Take 1 tablet by mouth daily.   [DISCONTINUED] lisinopril (ZESTRIL) 40 MG tablet Take 40 mg by mouth daily.   [DISCONTINUED] metoprolol tartrate (LOPRESSOR) 100 MG tablet Take 100 mg by mouth 2 (two) times daily.   [DISCONTINUED] omeprazole (PRILOSEC) 40 MG capsule Take 40 mg by mouth daily.   [DISCONTINUED] oxybutynin (DITROPAN-XL) 10 MG 24 hr tablet Take 10 mg by mouth at bedtime.   [DISCONTINUED] zolpidem (AMBIEN) 5 MG tablet Take 1 tablet by mouth at bedtime.  atorvastatin (LIPITOR) 40 MG tablet Take 1 tablet (40 mg total) by mouth daily.   lisinopril (ZESTRIL) 40 MG tablet Take 1 tablet (40 mg total) by mouth daily.   metoprolol tartrate (LOPRESSOR) 100 MG tablet Take 1 tablet (100 mg total) by mouth 2 (two) times daily.   omeprazole (PRILOSEC) 40 MG capsule Take 1 capsule (40 mg total) by mouth daily.   [DISCONTINUED] albuterol (VENTOLIN HFA) 108 (90 Base) MCG/ACT inhaler Inhale 2 puffs into the lungs every 6 (six) hours as needed for wheezing or shortness of breath.   [DISCONTINUED] amLODipine (NORVASC) 10 MG tablet Take 10 mg by mouth daily.   [DISCONTINUED] Docusate Sodium (STOOL SOFTENER LAXATIVE PO) Take by mouth 2 (two) times daily.   [DISCONTINUED] Doxylamine Succinate, Sleep, (SLEEP AID PO) Take by mouth daily.    [DISCONTINUED] estradiol (ESTRACE) 1 MG tablet Take by mouth daily.    [DISCONTINUED] FLUoxetine (PROZAC) 20 MG tablet Take 1 tablet (20 mg total) by mouth daily.   [DISCONTINUED] lidocaine (LIDODERM) 5 % Place 1 patch onto the skin every 12 (twelve) hours. Remove & Discard patch within 12 hours or as directed by MD   [DISCONTINUED] metoprolol succinate (TOPROL-XL) 100 MG 24 hr tablet Take 1 tablet (100 mg total) by mouth daily. Take with or immediately following a meal.   [DISCONTINUED] Vitamin D, Ergocalciferol, (DRISDOL) 1.25 MG (50000 UT) CAPS capsule TAKE ONE CAPSULE BY MOUTH ONCE A WEEK   No facility-administered encounter medications on file as of 09/12/2022.    Past Medical History:  Diagnosis Date   Anxiety    Arthritis    knee and back, h/o chronic L1 fracture   Back pain    h/o L1 fracture   Depression    Fractured elbow    GERD (gastroesophageal reflux disease)    High cholesterol    Hypertension    Migraine    Overactive bladder    Rib fracture    R 5th, 01/2011    Past Surgical History:  Procedure Laterality Date   ABDOMINAL HYSTERECTOMY  01/22/1984   Right ovary remains   ANKLE SURGERY Left 01/22/2003   BREAST BIOPSY  08/23/2010   right (benign)   CERVICAL DISC SURGERY  01/22/1992   CESAREAN SECTION     COLONOSCOPY N/A 05/30/2014   Procedure: COLONOSCOPY;  Surgeon: Wallace Cullens, MD;  Location: [facility] ENDOSCOPY;  Service: Gastroenterology;  Laterality: N/A;   ESOPHAGOGASTRODUODENOSCOPY N/A 05/30/2014   Procedure: ESOPHAGOGASTRODUODENOSCOPY (EGD);  Surgeon: Wallace Cullens, MD;  Location[facility] ENDOSCOPY;  Service: Gastroenterology;  Laterality: N/A;   HEMORRHOID SURGERY     internal and external   TUBAL LIGATION     WRIST SURGERY     right (ganglion cyst)    Family History  Problem Relation Age of Onset   Hypertension Mother    Alzheimer's disease Mother    Cancer Father        prostate   Mental illness Father    Mental illness Sister    Breast cancer Sister 39    Cancer Sister        breast cancer   Hypertension Brother    Bipolar disorder Daughter    Colon cancer Neg Hx     Social History   Socioeconomic History   Marital status: Legally Separated    Spouse name: Not on file   Number of children: 1   Years of education: Not on file   Highest education level: Not on file  Occupational History  Occupation: hairdresser, retired  Tobacco Use   Smoking status: Former   Smokeless tobacco: Never   Tobacco comments:    quit over 8 years ago  Vaping Use   Vaping status: Never Used  Substance and Sexual Activity   Alcohol use: No   Drug use: Not Currently   Sexual activity: Not Currently  Other Topics Concern   Not on file  Social History Narrative   Not on file   Social Determinants of Health   Financial Resource Strain: Medium Risk (09/12/2022)   Overall Financial Resource Strain (CARDIA)    Difficulty of Paying Living Expenses: Somewhat hard  Food Insecurity: No Food Insecurity (09/12/2022)   Hunger Vital Sign    Worried About Running Out of Food in the Last Year: Never true    Ran Out of Food in the Last Year: Never true  Transportation Needs: No Transportation Needs (09/12/2022)   PRAPARE - Administrator, Civil Service (Medical): No    Lack of Transportation (Non-Medical): No  Physical Activity: Inactive (09/12/2022)   Exercise Vital Sign    Days of Exercise per Week: 0 days    Minutes of Exercise per Session: 0 min  Stress: No Stress Concern Present (09/12/2022)   [employer] of Occupational Health - Occupational Stress Questionnaire    Feeling of Stress : Only a little  Social Connections: Moderately Isolated (09/12/2022)   Social Connection and Isolation Panel [NHANES]    Frequency of Communication with Friends and Family: More than three times a week    Frequency of Social Gatherings with Friends and Family: More than three times a week    Attends Religious Services: 1 to 4 times per year    Active  Member of [employer] or Organizations: No    Attends Banker Meetings: Never    Marital Status: Widowed  Intimate Partner Violence: Not At Risk (09/12/2022)   Humiliation, Afraid, Rape, and Kick questionnaire    Fear of Current or Ex-Partner: No    Emotionally Abused: No    Physically Abused: No    Sexually Abused: No   Review of Systems  Constitutional:  Negative for chills, diaphoresis, fever, malaise/fatigue and weight loss.  Respiratory:  Negative for cough, shortness of breath and wheezing.   Cardiovascular:  Negative for chest pain, palpitations, orthopnea and leg swelling.  Gastrointestinal: Negative.   Musculoskeletal:  Positive for joint pain.  Neurological: Negative.   Endo/Heme/Allergies: Negative.   Psychiatric/Behavioral:  Positive for depression. Negative for memory loss and suicidal ideas. The patient is nervous/anxious and has insomnia.        Objective    BP 138/82 (BP Location: Left Arm, Patient Position: Sitting, Cuff Size: Large)   Pulse 78   Temp 97.9 F (36.6 C) (Oral)   Resp 18   Ht 5' 3.1" (1.603 m)   Wt 195 lb 12.8 oz (88.8 kg)   SpO2 97%   BMI 34.57 kg/m   Physical Exam Vitals and nursing note reviewed.  Constitutional:      General: She is awake. She is not in acute distress.    Appearance: She is well-developed and well-groomed. She is obese. She is not ill-appearing or toxic-appearing.  HENT:     Head: Normocephalic.     Right Ear: Hearing and external ear normal.     Left Ear: Hearing and external ear normal.  Eyes:     General: Lids are normal.        Right eye: No  discharge.        Left eye: No discharge.     Conjunctiva/sclera: Conjunctivae normal.     Pupils: Pupils are equal, round, and reactive to light.  Neck:     Thyroid: No thyromegaly.     Vascular: No carotid bruit.  Cardiovascular:     Rate and Rhythm: Normal rate and regular rhythm.     Heart sounds: Normal heart sounds. No murmur heard.    No gallop.   Pulmonary:     Effort: Pulmonary effort is normal. No accessory muscle usage or respiratory distress.     Breath sounds: Normal breath sounds. No decreased breath sounds, wheezing or rhonchi.  Abdominal:     General: Bowel sounds are normal. There is no distension.     Palpations: Abdomen is soft.     Tenderness: There is no abdominal tenderness.  Musculoskeletal:     Cervical back: Normal range of motion and neck supple.     Right knee: Crepitus present. No swelling, erythema or bony tenderness. Normal range of motion. Tenderness present over the medial joint line.     Instability Tests: Anterior drawer test negative. Posterior drawer test negative. Medial McMurray test negative and lateral McMurray test negative.     Left knee: Normal.     Right lower leg: No edema.     Left lower leg: No edema.  Lymphadenopathy:     Cervical: No cervical adenopathy.  Skin:    General: Skin is warm and dry.  Neurological:     Mental Status: She is alert and oriented to person, place, and time.     Deep Tendon Reflexes: Reflexes are normal and symmetric.     Reflex Scores:      Brachioradialis reflexes are 2+ on the right side and 2+ on the left side.      Patellar reflexes are 2+ on the right side and 2+ on the left side. Psychiatric:        Attention and Perception: Attention normal.        Mood and Affect: Mood normal.        Speech: Speech normal.        Behavior: Behavior normal. Behavior is cooperative.        Thought Content: Thought content normal.    Last CBC Lab Results  Component Value Date   WBC 12.7 (H) 12/17/2020   HGB 11.8 (L) 12/17/2020   HCT 35.6 (L) 12/17/2020   MCV 94.9 12/17/2020   MCH 31.5 12/17/2020   RDW 12.1 12/17/2020   PLT 199 12/17/2020   Last metabolic panel Lab Results  Component Value Date   GLUCOSE 102 (H) 12/17/2020   NA 136 12/17/2020   K 3.7 12/17/2020   CL 104 12/17/2020   CO2 25 12/17/2020   BUN 16 12/17/2020   CREATININE 0.85 12/17/2020    GFRNONAA >60 12/17/2020   CALCIUM 9.0 12/17/2020   PROT 7.5 04/05/2019   ALBUMIN 4.1 04/05/2019   LABGLOB 2.9 12/25/2018   AGRATIO 1.4 12/25/2018   BILITOT 0.7 04/05/2019   ALKPHOS 58 04/05/2019   AST 12 (L) 04/05/2019   ALT 11 04/05/2019   ANIONGAP 7 12/17/2020   Last lipids Lab Results  Component Value Date   CHOL 205 (H) 12/25/2018   HDL 45 12/25/2018   LDLCALC 139 (H) 12/25/2018   LDLDIRECT 101.0 10/08/2011   TRIG 116 12/25/2018   CHOLHDL 5 10/08/2011      Assessment & Plan:   Problem List Items Addressed  This Visit       Cardiovascular and Mediastinum   Essential hypertension    Chronic, ongoing.  BP stable on recheck today.  Recommend she monitor BP at least a few mornings a week at home and document.  DASH diet at home.  Continue current medication regimen and adjust as needed.  Labs today: CBC, CMP, TSH, Lipid.  Refills sent in.       Relevant Medications   metoprolol tartrate (LOPRESSOR) 100 MG tablet   lisinopril (ZESTRIL) 40 MG tablet   atorvastatin (LIPITOR) 40 MG tablet   Other Relevant Orders   CBC with Differential/Platelet   Comprehensive metabolic panel     Digestive   GERD    Chronic, ongoing.  Will continue current regimen and adjust as needed.  Risks of PPI use were discussed with patient including bone loss, C. Diff diarrhea, pneumonia, infections, CKD, electrolyte abnormalities.  Verbalizes understanding and chooses to continue the medication.  Mag level annually, collect at future visit.      Relevant Medications   omeprazole (PRILOSEC) 40 MG capsule     Genitourinary   OAB (overactive bladder)    Chronic, ongoing.  Currently taking Ditropan XL 20 MG daily.  Educated her on this medication and safe medications in patients 65 and up.  Would benefit from change to Mybetriq.  She does not want to change at this time.  Will continue Ditropan XL dosing for now, refills sent in.  May benefit pelvic floor therapy in future.        Other    Anxiety    Chronic, ongoing.  Denies Si/HI.  Has strong faith in God.  Reports benefit from Xanax in past + that other medications thus far have not offered same benefit -- she is hesitant about trying other medications, although discussed with her at length that benzos are not recommend in patients over 65.  Educated her on SSRI and SNRI family of medications.  Has not tried Lexapro.  Will trial Lexapro 5 MG daily to start and adjust as needed.  Educated on medication + possible side effects.  Discussed BLACK BOX warning.  Will increase Buspar to 10 MG BID.  If poor control with trial of medications, will consider psychiatry referral in future. She is not pleased with this plan but willing to trial.      Relevant Medications   hydrOXYzine (ATARAX) 25 MG tablet   escitalopram (LEXAPRO) 5 MG tablet   busPIRone (BUSPAR) 10 MG tablet   Chronic pain of right knee    Ongoing for several years per patient.  Will obtain imaging of right knee to further assess.  Recommend she take Tylenol at home as needed + may use Voltaren gel.  Gentle stretching daily and attempt to stay active.  Would benefit from modest weight loss.      Relevant Orders   DG Knee Complete 4 Views Right   Depression, recurrent (HCC) - Primary    Chronic, ongoing.  Denies Si/HI.  Has strong faith in God.  Reports benefit from Xanax in past + that other medications thus far have not offered same benefit.  Educated her on SSRI and SNRI family of medications.  Has not tried Lexapro.  Will trial Lexapro 5 MG daily to start and adjust as needed.  Educated on medication + possible side effects.  Discussed BLACK BOX warning.  Will increase Buspar to 10 MG BID.  If poor control with trial of medications, will consider psychiatry referral in future.  Relevant Medications   hydrOXYzine (ATARAX) 25 MG tablet   escitalopram (LEXAPRO) 5 MG tablet   busPIRone (BUSPAR) 10 MG tablet   Other Relevant Orders   TSH   History of benzodiazepine  use    Was prescribed by Dr. Dossie Parker in past per her report.  Discussed with her that these are not recommended in patients over 65.  Educated on alternate options, which she is willing to trial.      Insomnia    Chronic, ongoing.  Reports trying Ambien, Trazodone, and Melatonin in past with no benefit.  States Xanax is only thing that has helped her in past.  Discussed with her at length safe medications in patients 65 and up.  Will trial Belsomra, starting at low dose and adjust as needed.  Recommend heavy focus on sleep hygiene.  Would benefit from sleep study in future, declines today.      Mixed hyperlipidemia    Chronic, ongoing.  Continue current medication regimen and adjust as needed.  Lipid panel and CMP today.         Relevant Medications   metoprolol tartrate (LOPRESSOR) 100 MG tablet   lisinopril (ZESTRIL) 40 MG tablet   atorvastatin (LIPITOR) 40 MG tablet   Other Relevant Orders   Comprehensive metabolic panel   Lipid Panel w/o Chol/HDL Ratio   Obesity    BMI 34.57.  She is interested in weight loss medications.  No family history of thyroid cancer (MTC, MEN 2, thyroid cell tumors) or pancreatitis.   Discussed all medications prescribed in office for weight loss, risks/benefits.  She wishes to think about injectables for now.  Recommended eating smaller high protein, low fat meals more frequently and exercising 30 mins a day 5 times a week with a goal of 10-15lb weight loss in the next 3 months. Patient voiced their understanding and motivation to adhere to these recommendations.       Other Visit Diagnoses     Encounter for screening for HIV       HIV screening on labs today per guideline recommendations, discussed with patient.   Relevant Orders   HIV Antibody (routine testing w rflx)   Need for hepatitis C screening test       Hep C screening on labs today per guideline recommendations, discussed with patient.   Relevant Orders   Hepatitis C antibody        Return in about 6 weeks (around 10/24/2022) for INSOMNIA AND MOOD -- Belsomra and Lexapro added.Beth Skiff, NP

## 2022-09-12 NOTE — Assessment & Plan Note (Signed)
Chronic, ongoing.  Continue current medication regimen and adjust as needed.  Lipid panel and CMP today.    

## 2022-09-12 NOTE — Assessment & Plan Note (Signed)
Was prescribed by Dr. Dossie Arbour in past per her report.  Discussed with her that these are not recommended in patients over 65.  Educated on alternate options, which she is willing to trial.

## 2022-09-12 NOTE — Assessment & Plan Note (Signed)
BMI 34.57.  She is interested in weight loss medications.  No family history of thyroid cancer (MTC, MEN 2, thyroid cell tumors) or pancreatitis.   Discussed all medications prescribed in office for weight loss, risks/benefits.  She wishes to think about injectables for now.  Recommended eating smaller high protein, low fat meals more frequently and exercising 30 mins a day 5 times a week with a goal of 10-15lb weight loss in the next 3 months. Patient voiced their understanding and motivation to adhere to these recommendations.

## 2022-09-12 NOTE — Assessment & Plan Note (Signed)
Chronic, ongoing.  Will continue current regimen and adjust as needed.  Risks of PPI use were discussed with patient including bone loss, C. Diff diarrhea, pneumonia, infections, CKD, electrolyte abnormalities.  Verbalizes understanding and chooses to continue the medication.  Mag level annually, collect at future visit.

## 2022-09-12 NOTE — Assessment & Plan Note (Signed)
Chronic, ongoing.  Reports trying Ambien, Trazodone, and Melatonin in past with no benefit.  States Xanax is only thing that has helped her in past.  Discussed with her at length safe medications in patients 65 and up.  Will trial Belsomra, starting at low dose and adjust as needed.  Recommend heavy focus on sleep hygiene.  Would benefit from sleep study in future, declines today.

## 2022-09-12 NOTE — Assessment & Plan Note (Signed)
Chronic, ongoing.  BP stable on recheck today.  Recommend she monitor BP at least a few mornings a week at home and document.  DASH diet at home.  Continue current medication regimen and adjust as needed.  Labs today: CBC, CMP, TSH, Lipid.  Refills sent in.

## 2022-09-12 NOTE — Assessment & Plan Note (Signed)
Chronic, ongoing.  Denies Si/HI.  Has strong faith in God.  Reports benefit from Xanax in past + that other medications thus far have not offered same benefit -- she is hesitant about trying other medications, although discussed with her at length that benzos are not recommend in patients over 65.  Educated her on SSRI and SNRI family of medications.  Has not tried Lexapro.  Will trial Lexapro 5 MG daily to start and adjust as needed.  Educated on medication + possible side effects.  Discussed BLACK BOX warning.  Will increase Buspar to 10 MG BID.  If poor control with trial of medications, will consider psychiatry referral in future. She is not pleased with this plan but willing to trial.

## 2022-09-13 LAB — CBC WITH DIFFERENTIAL/PLATELET
Basophils Absolute: 0 10*3/uL (ref 0.0–0.2)
Basos: 1 %
EOS (ABSOLUTE): 0.2 10*3/uL (ref 0.0–0.4)
Eos: 3 %
Hematocrit: 41.5 % (ref 34.0–46.6)
Hemoglobin: 13.4 g/dL (ref 11.1–15.9)
Immature Grans (Abs): 0 10*3/uL (ref 0.0–0.1)
Immature Granulocytes: 0 %
Lymphocytes Absolute: 2.1 10*3/uL (ref 0.7–3.1)
Lymphs: 31 %
MCH: 30.5 pg (ref 26.6–33.0)
MCHC: 32.3 g/dL (ref 31.5–35.7)
MCV: 94 fL (ref 79–97)
Monocytes Absolute: 0.6 10*3/uL (ref 0.1–0.9)
Monocytes: 9 %
Neutrophils Absolute: 3.9 10*3/uL (ref 1.4–7.0)
Neutrophils: 56 %
Platelets: 184 10*3/uL (ref 150–450)
RBC: 4.4 x10E6/uL (ref 3.77–5.28)
RDW: 11.8 % (ref 11.7–15.4)
WBC: 6.8 10*3/uL (ref 3.4–10.8)

## 2022-09-13 LAB — COMPREHENSIVE METABOLIC PANEL
ALT: 31 IU/L (ref 0–32)
AST: 28 IU/L (ref 0–40)
Albumin: 4.3 g/dL (ref 3.9–4.9)
Alkaline Phosphatase: 102 IU/L (ref 44–121)
BUN/Creatinine Ratio: 19 (ref 12–28)
BUN: 19 mg/dL (ref 8–27)
Bilirubin Total: 0.4 mg/dL (ref 0.0–1.2)
CO2: 25 mmol/L (ref 20–29)
Calcium: 9.3 mg/dL (ref 8.7–10.3)
Chloride: 104 mmol/L (ref 96–106)
Creatinine, Ser: 1.02 mg/dL — ABNORMAL HIGH (ref 0.57–1.00)
Globulin, Total: 2.8 g/dL (ref 1.5–4.5)
Glucose: 102 mg/dL — ABNORMAL HIGH (ref 70–99)
Potassium: 4.8 mmol/L (ref 3.5–5.2)
Sodium: 142 mmol/L (ref 134–144)
Total Protein: 7.1 g/dL (ref 6.0–8.5)
eGFR: 61 mL/min/{1.73_m2} (ref >59)

## 2022-09-13 LAB — HEPATITIS C ANTIBODY: Hep C Virus Ab: NONREACTIVE

## 2022-09-13 LAB — LIPID PANEL W/O CHOL/HDL RATIO
Cholesterol, Total: 169 mg/dL (ref 100–199)
HDL: 40 mg/dL (ref >39)
LDL Chol Calc (NIH): 98 mg/dL (ref 0–99)
Triglycerides: 177 mg/dL — ABNORMAL HIGH (ref 0–149)
VLDL Cholesterol Cal: 31 mg/dL (ref 5–40)

## 2022-09-13 LAB — HIV ANTIBODY (ROUTINE TESTING W REFLEX): HIV Screen 4th Generation wRfx: NONREACTIVE

## 2022-09-13 LAB — TSH: TSH: 2.01 u[IU]/mL (ref 0.450–4.500)

## 2022-09-13 NOTE — Progress Notes (Signed)
Good morning, please let Beth Parker know her labs have returned: - Hep C and HIV are negative. - Kidney function, creatinine and eGFR, remains normal, as is liver function, AST and ALT.  - Cholesterol levels are stable, but we will talk next visit about changing or increasing current medication to get better control. - Remainder of labs are stable.  Great news!!  Any questions? Keep being amazing!!  Thank you for allowing me to participate in your care.  I appreciate you. Kindest regards, Jourdyn Ferrin

## 2022-10-20 NOTE — Patient Instructions (Incomplete)
Insomnia Insomnia is a sleep disorder that makes it difficult to fall asleep or stay asleep. Insomnia can cause fatigue, low energy, difficulty concentrating, mood swings, and poor performance at work or school. There are three different ways to classify insomnia: Difficulty falling asleep. Difficulty staying asleep. Waking up too early in the morning. Any type of insomnia can be long-term (chronic) or short-term (acute). Both are common. Short-term insomnia usually lasts for 3 months or less. Chronic insomnia occurs at least three times a week for longer than 3 months. What are the causes? Insomnia may be caused by another condition, situation, or substance, such as: Having certain mental health conditions, such as anxiety and depression. Using caffeine, alcohol, tobacco, or drugs. Having gastrointestinal conditions, such as gastroesophageal reflux disease (GERD). Having certain medical conditions. These include: Asthma. Alzheimer's disease. Stroke. Chronic pain. An overactive thyroid gland (hyperthyroidism). Other sleep disorders, such as restless legs syndrome and sleep apnea. Menopause. Sometimes, the cause of insomnia may not be known. What increases the risk? Risk factors for insomnia include: Gender. Females are affected more often than males. Age. Insomnia is more common as people get older. Stress and certain medical and mental health conditions. Lack of exercise. Having an irregular work schedule. This may include working night shifts and traveling between different time zones. What are the signs or symptoms? If you have insomnia, the main symptom is having trouble falling asleep or having trouble staying asleep. This may lead to other symptoms, such as: Feeling tired or having low energy. Feeling nervous about going to sleep. Not feeling rested in the morning. Having trouble concentrating. Feeling irritable, anxious, or depressed. How is this diagnosed? This condition  may be diagnosed based on: Your symptoms and medical history. Your health care provider may ask about: Your sleep habits. Any medical conditions you have. Your mental health. A physical exam. How is this treated? Treatment for insomnia depends on the cause. Treatment may focus on treating an underlying condition that is causing the insomnia. Treatment may also include: Medicines to help you sleep. Counseling or therapy. Lifestyle adjustments to help you sleep better. Follow these instructions at home: Eating and drinking  Limit or avoid alcohol, caffeinated beverages, and products that contain nicotine and tobacco, especially close to bedtime. These can disrupt your sleep. Do not eat a large meal or eat spicy foods right before bedtime. This can lead to digestive discomfort that can make it hard for you to sleep. Sleep habits  Keep a sleep diary to help you and your health care provider figure out what could be causing your insomnia. Write down: When you sleep. When you wake up during the night. How well you sleep and how rested you feel the next day. Any side effects of medicines you are taking. What you eat and drink. Make your bedroom a dark, comfortable place where it is easy to fall asleep. Put up shades or blackout curtains to block light from outside. Use a white noise machine to block noise. Keep the temperature cool. Limit screen use before bedtime. This includes: Not watching TV. Not using your smartphone, tablet, or computer. Stick to a routine that includes going to bed and waking up at the same times every day and night. This can help you fall asleep faster. Consider making a quiet activity, such as reading, part of your nighttime routine. Try to avoid taking naps during the day so that you sleep better at night. Get out of bed if you are still awake after   15 minutes of trying to sleep. Keep the lights down, but try reading or doing a quiet activity. When you feel  sleepy, go back to bed. General instructions Take over-the-counter and prescription medicines only as told by your health care provider. Exercise regularly as told by your health care provider. However, avoid exercising in the hours right before bedtime. Use relaxation techniques to manage stress. Ask your health care provider to suggest some techniques that may work well for you. These may include: Breathing exercises. Routines to release muscle tension. Visualizing peaceful scenes. Make sure that you drive carefully. Do not drive if you feel very sleepy. Keep all follow-up visits. This is important. Contact a health care provider if: You are tired throughout the day. You have trouble in your daily routine due to sleepiness. You continue to have sleep problems, or your sleep problems get worse. Get help right away if: You have thoughts about hurting yourself or someone else. Get help right away if you feel like you may hurt yourself or others, or have thoughts about taking your own life. Go to your nearest emergency room or: Call 911. Call the National Suicide Prevention Lifeline at 1-800-273-8255 or 988. This is open 24 hours a day. Text the Crisis Text Line at 741741. Summary Insomnia is a sleep disorder that makes it difficult to fall asleep or stay asleep. Insomnia can be long-term (chronic) or short-term (acute). Treatment for insomnia depends on the cause. Treatment may focus on treating an underlying condition that is causing the insomnia. Keep a sleep diary to help you and your health care provider figure out what could be causing your insomnia. This information is not intended to replace advice given to you by your health care provider. Make sure you discuss any questions you have with your health care provider. Document Revised: 12/18/2020 Document Reviewed: 12/18/2020 Elsevier Patient Education  2024 [employer]  

## 2022-10-24 ENCOUNTER — Encounter: Payer: Self-pay | Admitting: Nurse Practitioner

## 2022-10-24 ENCOUNTER — Ambulatory Visit
Admission: RE | Admit: 2022-10-24 | Discharge: 2022-10-24 | Disposition: A | Discharge status: Home / Self Care | Payer: Medicare (Managed Care) | Source: Ambulatory Visit | Attending: Nurse Practitioner | Admitting: Nurse Practitioner

## 2022-10-24 ENCOUNTER — Ambulatory Visit: Payer: Medicare (Managed Care) | Admitting: Nurse Practitioner

## 2022-10-24 ENCOUNTER — Ambulatory Visit
Admission: RE | Admit: 2022-10-24 | Discharge: 2022-10-24 | Disposition: A | Discharge status: Home / Self Care | Payer: Medicare (Managed Care) | Attending: Nurse Practitioner | Admitting: Nurse Practitioner

## 2022-10-24 ENCOUNTER — Other Ambulatory Visit: Payer: Self-pay | Admitting: Nurse Practitioner

## 2022-10-24 VITALS — BP 138/86 | HR 60 | Temp 97.9°F | Ht 63.1 in | Wt 197.8 lb

## 2022-10-24 DIAGNOSIS — E6609 Other obesity due to excess calories: Secondary | ICD-10-CM

## 2022-10-24 DIAGNOSIS — F339 Major depressive disorder, recurrent, unspecified: Secondary | ICD-10-CM

## 2022-10-24 DIAGNOSIS — M5441 Lumbago with sciatica, right side: Secondary | ICD-10-CM

## 2022-10-24 DIAGNOSIS — M25561 Pain in right knee: Secondary | ICD-10-CM | POA: Diagnosis present

## 2022-10-24 DIAGNOSIS — F419 Anxiety disorder, unspecified: Secondary | ICD-10-CM

## 2022-10-24 DIAGNOSIS — G8929 Other chronic pain: Secondary | ICD-10-CM

## 2022-10-24 DIAGNOSIS — E66811 Obesity, class 1: Secondary | ICD-10-CM

## 2022-10-24 DIAGNOSIS — F5104 Psychophysiologic insomnia: Secondary | ICD-10-CM

## 2022-10-24 DIAGNOSIS — Z87898 Personal history of other specified conditions: Secondary | ICD-10-CM

## 2022-10-24 DIAGNOSIS — M5442 Lumbago with sciatica, left side: Secondary | ICD-10-CM

## 2022-10-24 DIAGNOSIS — Z6834 Body mass index (BMI) 34.0-34.9, adult: Secondary | ICD-10-CM

## 2022-10-24 DIAGNOSIS — K5901 Slow transit constipation: Secondary | ICD-10-CM

## 2022-10-24 DIAGNOSIS — K5900 Constipation, unspecified: Secondary | ICD-10-CM | POA: Insufficient documentation

## 2022-10-24 MED ORDER — BELSOMRA 10 MG PO TABS: 10.0000 mg | ORAL_TABLET | Freq: Every evening | ORAL | 1 refills | Status: DC | PRN

## 2022-10-24 MED ORDER — AMLODIPINE BESYLATE 5 MG PO TABS: 5.0000 mg | ORAL_TABLET | Freq: Every day | ORAL | 4 refills | Status: DC

## 2022-10-24 MED ORDER — GABAPENTIN 300 MG PO CAPS: 300.0000 mg | ORAL_CAPSULE | Freq: Every day | ORAL | 4 refills | Status: DC

## 2022-10-24 MED ORDER — BUSPIRONE HCL 15 MG PO TABS: 15.0000 mg | ORAL_TABLET | Freq: Two times a day (BID) | ORAL | 2 refills | Status: DC

## 2022-10-24 MED ORDER — TRIAMCINOLONE ACETONIDE 0.1 % EX CREA: 1.0000 | TOPICAL_CREAM | Freq: Two times a day (BID) | CUTANEOUS | 2 refills | Status: DC

## 2022-10-24 NOTE — Telephone Encounter (Signed)
Transfer medications to [facility] in [place].

## 2022-10-24 NOTE — Assessment & Plan Note (Signed)
Chronic, ongoing.  Reports trying Ambien, Trazodone, and Melatonin in past with no benefit.  States Xanax is only thing that has helped her in past and is insistent it is only thing that will work, have had discussions on long term use of this with her + addictive element, although she reports not wanting to take anything addictive.  Discussed with her at length safe medications in patients 65 and up.  Will increase Belsomra to 10 MG nightly and adjust as needed.  Recommend heavy focus on sleep hygiene.  Would benefit from sleep study in future, continues to decline.  Suspect some her insomnia may be related to OSA.

## 2022-10-24 NOTE — Assessment & Plan Note (Signed)
BMI 34.93.  She is interested in weight loss medications.  No family history of thyroid cancer (MTC, MEN 2, thyroid cell tumors) or pancreatitis.   Discussed all medications prescribed in office for weight loss, risks/benefits.  She wishes to think about injectables for now.  Recommended eating smaller high protein, low fat meals more frequently and exercising 30 mins a day 5 times a week with a goal of 10-15lb weight loss in the next 3 months. Patient voiced their understanding and motivation to adhere to these recommendations.

## 2022-10-24 NOTE — Assessment & Plan Note (Signed)
Was prescribed by Dr. Dossie Arbour in past per her report.  Discussed with her that these are not recommended in patients over 65.  Educated on alternate options, however she is adamant Xanax is only thing that has worked, but at same time she reports not wanting to take anything addictive.  She state Xanax were not addictive for her.

## 2022-10-24 NOTE — Assessment & Plan Note (Signed)
Ongoing for some time.  Recommend she start taking Metamucil daily in morning + take Senna S as needed.  Discussed with her need for increase water intake and would benefit from prunes if she can tolerate these.  Discussed red flag symptoms to monitor for.

## 2022-10-24 NOTE — Assessment & Plan Note (Signed)
Chronic, ongoing.  Denies Si/HI.  Has strong faith in God.  Lexapro did not help she reports. Reports benefit from Xanax in past + that other medications thus far have not offered same benefit -- she is hesitant about trying other medications, although discussed with her at length that benzos are not recommended in patients over 65.  Educated on medication + possible side effects.  Discussed BLACK BOX warning.  Will increase Buspar to 15 MG BID.  Referral to psychiatry placed for more recommendations on this more complex case. She is not pleased with this plan but willing to trial.

## 2022-10-24 NOTE — Assessment & Plan Note (Signed)
Chronic, ongoing.  States she needs something stronger than OTC medications.  Discussed with her there are alternate options we could try first.  Educated her on Gabapentin and benefit to sciatic pain.  She is willing to trial this.  Start out with 300 MG at night, do not take at same time as Belsomra -- take Gabapentin more in evening hours around 5 pm. Educated her on this medication and side effects.  Referral to ortho for further recommendations and evaluation.

## 2022-10-24 NOTE — Progress Notes (Signed)
BP 138/86 (BP Location: Left Arm, Patient Position: Sitting, Cuff Size: Normal)   Pulse 60   Temp 97.9 F (36.6 C) (Oral)   Ht 5' 3.1" (1.603 m)   Wt 197 lb 12.8 oz (89.7 kg)   SpO2 97%   BMI 34.93 kg/m    Subjective:    Patient ID: Beth Parker, female    DOB: 04-05-1955, 67 y.o.   MRN: [id number]  HPI: Beth Parker is a 67 y.o. female  Chief Complaint  Patient presents with   Insomnia    Patient says the prescription for Belsomra is not helping, says she still can't sleep.    Mood    Patient says the Lexapro prescription isn't working and has too many side effects.    Constipation   CONSTIPATION Reports having issues with constipation. Started on Saturday was really bad, has been off and on, but has gotten worse.  Using OTC pills.  Has not tried Metamucil.  [employer], from [place].  Does not like prunes or prune juice.   Duration:days Status: worse Treatments attempted: OTC pills Fever: no Nausea: no Vomiting: no Weight loss: no Decreased appetite: no Diarrhea: no Constipation: yes Blood in stool: no Heartburn: no Jaundice: no Rash: no Dysuria/urinary frequency: no Hematuria: no  INSOMNIA Started Belsomra at visit 09/12/22, she reports this is not working. Was taking Atarax for sleep, took 5 to get to sleep she reported and it did not work.  Husband just passed March 27th, they had not been together in 3 years. Has taken Trazodone + Melatonin + Ambien in past without benefit.  Continues to have chronic back pain.  Had surgery years ago, which made it better for a long time -- but now it has become worse.  Reports no over the counter medications work for her.  Does get sciatic pain with it.  States no over the counter medications work for her pain. Duration: chronic Satisfied with sleep quality: no Difficulty falling asleep: yes Difficulty staying asleep: yes Waking a few hours after sleep onset: yes Early morning awakenings: no Daytime  hypersomnolence: no Wakes feeling refreshed: no Good sleep hygiene: yes Apnea: no Snoring: no Depressed/anxious mood: yes Recent stress: no Restless legs/nocturnal leg cramps: no Chronic pain/arthritis: yes History of sleep study: no Treatments attempted: melatonin and ambien    DEPRESSION Follow-up today for starting Lexapro on 09/12/22, she reports this is not working and has too many side effects -- offered no benefit she reports, gaining weight.  Reports panic attacks at baseline, on new patient visit was on Vistaril and Buspar only.  She had reported Xanax use in past, prescribed by Dr. Dossie Arbour years ago and this worked well for her, she stated it worked better than anything else and is insistent of need for this. Continues to report she does not want to take anything addictive, discussed with her that benzos are addictive and educated on other risks over 84 with these long term.   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  Has not used in several years. Reports she has high tolerance to drugs due to this history.   Does not recall other medications she took.  History of TBI. Mood status: exacerbated Satisfied with current treatment?: yes Symptom severity: moderate  Duration of current treatment : chronic Side effects: no Medication compliance: good compliance Psychotherapy/counseling: yes in the past Previous psychiatric medications: Ambien,  Xanax, Atarax, Buspar, Lexapro, Belsomra -- multiple she does not recall Depressed mood: yes Anxious mood: yes Anhedonia: no Significant weight loss or gain: no Insomnia: yes hard to fall asleep Fatigue: yes Feelings of worthlessness or guilt: no Impaired concentration/indecisiveness: no Suicidal ideations: no Hopelessness: no Crying spells: no    10/24/2022    8:53 AM 09/12/2022    9:32 AM 12/25/2018    3:41 PM  Depression screen PHQ 2/9  Decreased Interest 0 1 0  Down,  Depressed, Hopeless 0 1 3  PHQ - 2 Score 0 2 3  Altered sleeping 3 3 3   Tired, decreased energy 2 1 3   Change in appetite 0 1 0  Feeling bad or failure about yourself  0 1 0  Trouble concentrating 1 3 0  Moving slowly or fidgety/restless 0 0 0  Suicidal thoughts 1 0 0  PHQ-9 Score 7 11 9   Difficult doing work/chores Somewhat difficult Somewhat difficult        10/24/2022    8:53 AM 09/12/2022    9:33 AM  GAD 7 : Generalized Anxiety Score  Nervous, Anxious, on Edge 0 2  Control/stop worrying 0 0  Worry too much - different things 0 0  Trouble relaxing 0 1  Restless 0 0  Easily annoyed or irritable 0 0  Afraid - awful might happen 1 0  Total GAD 7 Score 1 3  Anxiety Difficulty Somewhat difficult Somewhat difficult   Relevant past medical, surgical, family and social history reviewed and updated as indicated. Interim medical history since our last visit reviewed. Allergies and medications reviewed and updated.  Review of Systems  Constitutional:  Positive for fatigue. Negative for activity change, appetite change, diaphoresis and fever.  Respiratory:  Negative for cough, chest tightness, shortness of breath and wheezing.   Cardiovascular:  Negative for chest pain, palpitations and leg swelling.  Gastrointestinal:  Positive for constipation. Negative for abdominal distention, abdominal pain, diarrhea, nausea and vomiting.  Musculoskeletal:  Positive for back pain.  Neurological: Negative.   Psychiatric/Behavioral:  Positive for sleep disturbance. Negative for decreased concentration, self-injury and suicidal ideas. The patient is nervous/anxious.    Per HPI unless specifically indicated above     Objective:    BP 138/86 (BP Location: Left Arm, Patient Position: Sitting, Cuff Size: Normal)   Pulse 60   Temp 97.9 F (36.6 C) (Oral)   Ht 5' 3.1" (1.603 m)   Wt 197 lb 12.8 oz (89.7 kg)   SpO2 97%   BMI 34.93 kg/m   Wt Readings from Last 3 Encounters:  10/24/22 197 lb 12.8  oz (89.7 kg)  09/12/22 195 lb 12.8 oz (88.8 kg)  12/17/20 190 lb (86.2 kg)    Physical Exam Vitals and nursing note reviewed.  Constitutional:      General: She is awake. She is not in acute distress.    Appearance: She is well-developed and well-groomed. She is obese. She is not ill-appearing or toxic-appearing.  HENT:     Head: Normocephalic.     Right Ear: Hearing and external ear normal.     Left Ear: Hearing and external ear normal.  Eyes:     General: Lids are normal.        Right eye: No discharge.        Left eye: No discharge.     Conjunctiva/sclera: Conjunctivae normal.     Pupils: Pupils are equal, round, and reactive to light.  Neck:     Thyroid: No thyromegaly.  Vascular: No carotid bruit.  Cardiovascular:     Rate and Rhythm: Normal rate and regular rhythm.     Heart sounds: Normal heart sounds. No murmur heard.    No gallop.  Pulmonary:     Effort: Pulmonary effort is normal. No accessory muscle usage or respiratory distress.     Breath sounds: Normal breath sounds.  Abdominal:     General: Bowel sounds are normal. There is no distension.     Palpations: Abdomen is soft.     Tenderness: There is no abdominal tenderness. There is no right CVA tenderness or left CVA tenderness.  Musculoskeletal:     Cervical back: Normal range of motion and neck supple.     Lumbar back: Tenderness present. No swelling, spasms or bony tenderness. Decreased range of motion.     Right lower leg: No edema.     Left lower leg: No edema.  Lymphadenopathy:     Cervical: No cervical adenopathy.  Skin:    General: Skin is warm and dry.  Neurological:     Mental Status: She is alert and oriented to person, place, and time.     Deep Tendon Reflexes: Reflexes are normal and symmetric.     Reflex Scores:      Brachioradialis reflexes are 2+ on the right side and 2+ on the left side.      Patellar reflexes are 2+ on the right side and 2+ on the left side. Psychiatric:         Attention and Perception: Attention normal.        Mood and Affect: Mood normal.        Speech: Speech normal.        Behavior: Behavior normal. Behavior is cooperative.        Thought Content: Thought content normal.     Results for orders placed or performed in visit on 09/12/22  CBC with Differential/Platelet  Result Value Ref Range   WBC 6.8 3.4 - 10.8 x10E3/uL   RBC 4.40 3.77 - 5.28 x10E6/uL   Hemoglobin 13.4 11.1 - 15.9 g/dL   Hematocrit 40.9 81.1 - 46.6 %   MCV 94 79 - 97 fL   MCH 30.5 26.6 - 33.0 pg   MCHC 32.3 31.5 - 35.7 g/dL   RDW 91.4 78.2 - 95.6 %   Platelets 184 150 - 450 x10E3/uL   Neutrophils 56 Not Estab. %   Lymphs 31 Not Estab. %   Monocytes 9 Not Estab. %   Eos 3 Not Estab. %   Basos 1 Not Estab. %   Neutrophils Absolute 3.9 1.4 - 7.0 x10E3/uL   Lymphocytes Absolute 2.1 0.7 - 3.1 x10E3/uL   Monocytes Absolute 0.6 0.1 - 0.9 x10E3/uL   EOS (ABSOLUTE) 0.2 0.0 - 0.4 x10E3/uL   Basophils Absolute 0.0 0.0 - 0.2 x10E3/uL   Immature Granulocytes 0 Not Estab. %   Immature Grans (Abs) 0.0 0.0 - 0.1 x10E3/uL  Comprehensive metabolic panel  Result Value Ref Range   Glucose 102 (H) 70 - 99 mg/dL   BUN 19 8 - 27 mg/dL   Creatinine, Ser 2.13 (H) 0.57 - 1.00 mg/dL   eGFR 61 >08 MV/HQI/6.96   BUN/Creatinine Ratio 19 12 - 28   Sodium 142 134 - 144 mmol/L   Potassium 4.8 3.5 - 5.2 mmol/L   Chloride 104 96 - 106 mmol/L   CO2 25 20 - 29 mmol/L   Calcium 9.3 8.7 - 10.3 mg/dL   Total Protein 7.1  6.0 - 8.5 g/dL   Albumin 4.3 3.9 - 4.9 g/dL   Globulin, Total 2.8 1.5 - 4.5 g/dL   Bilirubin Total 0.4 0.0 - 1.2 mg/dL   Alkaline Phosphatase 102 44 - 121 IU/L   AST 28 0 - 40 IU/L   ALT 31 0 - 32 IU/L  Lipid Panel w/o Chol/HDL Ratio  Result Value Ref Range   Cholesterol, Total 169 100 - 199 mg/dL   Triglycerides 865 (H) 0 - 149 mg/dL   HDL 40 >78 mg/dL   VLDL Cholesterol Cal 31 5 - 40 mg/dL   LDL Chol Calc (NIH) 98 0 - 99 mg/dL  TSH  Result Value Ref Range   TSH 2.010  0.450 - 4.500 uIU/mL  HIV Antibody (routine testing w rflx)  Result Value Ref Range   HIV Screen 4th Generation wRfx Non Reactive Non Reactive  Hepatitis C antibody  Result Value Ref Range   Hep C Virus Ab Non Reactive Non Reactive      Assessment & Plan:   Problem List Items Addressed This Visit       Other   Anxiety    Chronic, ongoing.  Denies Si/HI.  Has strong faith in God.  Lexapro did not help she reports. Reports benefit from Xanax in past + that other medications thus far have not offered same benefit -- she is hesitant about trying other medications, although discussed with her at length that benzos are not recommended in patients over 65.  Educated on medication + possible side effects.  Discussed BLACK BOX warning.  Will increase Buspar to 15 MG BID.  Referral to psychiatry placed for more recommendations on this more complex case. She is not pleased with this plan but willing to trial.      Relevant Medications   busPIRone (BUSPAR) 15 MG tablet   Other Relevant Orders   Ambulatory referral to Psychiatry   Chronic back pain    Chronic, ongoing.  States she needs something stronger than OTC medications.  Discussed with her there are alternate options we could try first.  Educated her on Gabapentin and benefit to sciatic pain.  She is willing to trial this.  Start out with 300 MG at night, do not take at same time as Belsomra -- take Gabapentin more in evening hours around 5 pm. Educated her on this medication and side effects.  Referral to ortho for further recommendations and evaluation.      Relevant Medications   gabapentin (NEURONTIN) 300 MG capsule   Other Relevant Orders   Ambulatory referral to Orthopedic Surgery   Constipation    Ongoing for some time.  Recommend she start taking Metamucil daily in morning + take Senna S as needed.  Discussed with her need for increase water intake and would benefit from prunes if she can tolerate these.  Discussed red flag symptoms  to monitor for.      Depression, recurrent (HCC) - Primary    Chronic, ongoing.  Denies Si/HI.  Has strong faith in God.  Recent Lexapro trial did not work she reports.  States Xanax is only thing that has helped her in past and is insistent it is only thing that will work, have had discussions on long term use of this with her + addictive element, although she reports not wanting to take anything addictive.  She has tried multiple medications in past.  Will increase Buspar to 15 MG BID.  Referral placed to psychiatry for further recommendations since she  has been on multiple medications in past and these did not work, she reports.      Relevant Medications   busPIRone (BUSPAR) 15 MG tablet   Other Relevant Orders   Ambulatory referral to Psychiatry   History of benzodiazepine use    Was prescribed by Dr. Dossie Arbour in past per her report.  Discussed with her that these are not recommended in patients over 65.  Educated on alternate options, however she is adamant Xanax is only thing that has worked, but at same time she reports not wanting to take anything addictive.  She state Xanax were not addictive for her.      Insomnia    Chronic, ongoing.  Reports trying Ambien, Trazodone, and Melatonin in past with no benefit.  States Xanax is only thing that has helped her in past and is insistent it is only thing that will work, have had discussions on long term use of this with her + addictive element, although she reports not wanting to take anything addictive.  Discussed with her at length safe medications in patients 65 and up.  Will increase Belsomra to 10 MG nightly and adjust as needed.  Recommend heavy focus on sleep hygiene.  Would benefit from sleep study in future, continues to decline.  Suspect some her insomnia may be related to OSA.      Relevant Orders   Ambulatory referral to Psychiatry   Obesity    BMI 34.93.  She is interested in weight loss medications.  No family history of thyroid  cancer (MTC, MEN 2, thyroid cell tumors) or pancreatitis.   Discussed all medications prescribed in office for weight loss, risks/benefits.  She wishes to think about injectables for now.  Recommended eating smaller high protein, low fat meals more frequently and exercising 30 mins a day 5 times a week with a goal of 10-15lb weight loss in the next 3 months. Patient voiced their understanding and motivation to adhere to these recommendations.         Follow up plan: Return in about 6 weeks (around 12/05/2022) for MOOD AND INSOMNIA .

## 2022-10-24 NOTE — Assessment & Plan Note (Signed)
Chronic, ongoing.  Denies Si/HI.  Has strong faith in God.  Recent Lexapro trial did not work she reports.  States Xanax is only thing that has helped her in past and is insistent it is only thing that will work, have had discussions on long term use of this with her + addictive element, although she reports not wanting to take anything addictive.  She has tried multiple medications in past.  Will increase Buspar to 15 MG BID.  Referral placed to psychiatry for further recommendations since she has been on multiple medications in past and these did not work, she reports.

## 2022-10-24 NOTE — Telephone Encounter (Signed)
Medication Refill - Medication: amLODipine (NORVASC) 5 MG tablet , busPIRone (BUSPAR) 15 MG tablet , gabapentin (NEURONTIN) 300 MG capsule , Suvorexant (BELSOMRA) 10 MG TABS , triamcinolone cream (KENALOG) 0.1 %   Pt is requesting medication be transferred to the pharmacy below. Was upset that it was sent to the wrong pharmacy.  Please advise.   Has the patient contacted their pharmacy? Yes.    (Agent: If yes, when and what did the pharmacy advise?)  Preferred Pharmacy (with phone number or street name):  [id number] [place], [place] - [address] AT [facility] OF [address]  [address] [place] [address]  Phone: [phone] Fax: [phone]  Hours: Not open 24 hours   Has the patient been seen for an appointment in the last year OR does the patient have an upcoming appointment? Yes.    Agent: Please be advised that RX refills may take up to 3 business days. We ask that you follow-up with your pharmacy.

## 2022-10-24 NOTE — Telephone Encounter (Signed)
Medications refilled 10/24/22, duplicate requests.  Requested Prescriptions  Pending Prescriptions Disp Refills   amLODipine (NORVASC) 5 MG tablet 90 tablet 4    Sig: Take 1 tablet (5 mg total) by mouth daily.     Cardiovascular: Calcium Channel Blockers 2 Passed - 10/24/2022 10:51 AM      Passed - Last BP in normal range    BP Readings from Last 1 Encounters:  10/24/22 138/86         Passed - Last Heart Rate in normal range    Pulse Readings from Last 1 Encounters:  10/24/22 60         Passed - Valid encounter within last 6 months    Recent Outpatient Visits           Today Depression, recurrent (HCC)   [place] [facility] [place], Corrie Dandy T, NP   1 month ago Depression, recurrent (HCC)   [place] [facility] [place], Corrie Dandy T, NP   3 years ago Upper respiratory tract infection, unspecified type   [place] [facility] Particia Nearing, [place]   3 years ago Cough   [place] [facility] Particia [place], [place]   3 years ago Essential hypertension   [place] [facility] [place], Salley Hews, [place]       Future Appointments             In 1 month Cannady, [place] T, NP [place] [facility]             busPIRone (BUSPAR) 15 MG tablet 180 tablet 2    Sig: Take 1 tablet (15 mg total) by mouth 2 (two) times daily.     Psychiatry: Anxiolytics/Hypnotics - Non-controlled Passed - 10/24/2022 10:51 AM      Passed - Valid encounter within last 12 months    Recent Outpatient Visits           Today Depression, recurrent (HCC)   [place] [facility] [place], [place] T, NP   1 month ago Depression, recurrent (HCC)   [place] [facility] [place], Corrie Dandy T, NP   3 years ago Upper respiratory tract infection, unspecified type   [place] [facility] Roosvelt Maser [place], [place]   3 years ago Cough   [place] [facility] Particia [place], [place]   3 years ago Essential hypertension   [place] [facility] Roosvelt Maser [place], [place]       Future Appointments             In 1 month Cannady, Col[place] T, NP [place] [facility]             gabapentin (NEURONTIN) 300 MG capsule 90 capsule 4    Sig: Take 1 capsule (300 mg total) by mouth at bedtime.     Neurology: Anticonvulsants - gabapentin Failed - 10/24/2022 10:51 AM      Failed - Cr in normal range and within 360 days    Creatinine  Date Value Ref Range Status  12/05/2012 0.92 0.60 - 1.30 mg/dL Final   Creatinine, Ser  Date Value Ref Range Status  09/12/2022 1.02 (H) 0.57 - 1.00 mg/dL Final         Passed - Completed PHQ-2 or PHQ-9 in the last 360 days      Passed - Valid encounter within last 12 months    Recent Outpatient Visits  Today Depression, recurrent (HCC)   [place] [facility] [place], Corrie Dandy T, NP   1 month ago Depression, recurrent (HCC)   [place] [facility] [place], Corrie Dandy T, NP   3 years ago Upper respiratory tract infection, unspecified type   [place] [facility] Roosvelt Maser [place], [place]   3 years ago Cough   [place] [facility] Particia [place], [place]   3 years ago Essential hypertension   [place] [facility] Roosvelt Maser [place], [place]       Future Appointments             In 1 month Cannady, [place] T, NP [place] [facility]             Suvorexant (BELSOMRA) 10 MG TABS 30 tablet 1    Sig: Take 1 tablet (10 mg total) by mouth at bedtime as needed.     Off-Protocol Failed - 10/24/2022 10:51 AM      Failed - Medication not assigned to a protocol, review manually.      Passed - Valid encounter within last 12 months    Recent Outpatient Visits           Today Depression, recurrent (HCC)   [place] [facility] [place], Corrie Dandy T, NP   1 month ago Depression, recurrent (HCC)   [place] [facility] [place], Corrie Dandy T, NP   3 years ago Upper respiratory tract infection, unspecified type   [place] [facility] Roosvelt Maser [place], [place]   3 years ago Cough   [place] [facility] Particia Nearing, [place]   3 years ago Essential hypertension   [place] [facility] Particia [place], [place]       Future Appointments             In 1 month Cannady, [place] T, NP [place] [facility]             triamcinolone cream (KENALOG) 0.1 % 45 g 2    Sig: Apply 1 Application topically 2 (two) times daily.     Not Delegated - Dermatology:  Corticosteroids Failed - 10/24/2022 10:51 AM      Failed - This refill cannot be delegated      Passed - Valid encounter within last 12 months    Recent Outpatient Visits           Today Depression, recurrent (HCC)   [place] [facility] [place], Corrie Dandy T, NP   1 month ago Depression, recurrent (HCC)   [place] [facility] [place], Corrie Dandy T, NP   3 years ago Upper respiratory tract infection, unspecified type   [place] [facility] Particia Nearing, [place]   3 years ago Cough   [place] [facility] Particia N[place], [place]   3 years ago Essential hypertension   [place] [facility] Particia [place], [place]       Future Appointments             In 1 month Cannady, Dorie Rank, NP [place] [facility]

## 2022-11-08 ENCOUNTER — Ambulatory Visit (INDEPENDENT_AMBULATORY_CARE_PROVIDER_SITE_OTHER): Payer: Medicare (Managed Care) | Admitting: Family Medicine

## 2022-11-08 ENCOUNTER — Ambulatory Visit: Payer: Self-pay | Admitting: *Deleted

## 2022-11-08 VITALS — BP 175/101 | HR 58 | Temp 98.3°F | Wt 199.6 lb

## 2022-11-08 DIAGNOSIS — I10 Essential (primary) hypertension: Secondary | ICD-10-CM | POA: Diagnosis not present

## 2022-11-08 DIAGNOSIS — F419 Anxiety disorder, unspecified: Secondary | ICD-10-CM | POA: Diagnosis not present

## 2022-11-08 DIAGNOSIS — M5441 Lumbago with sciatica, right side: Secondary | ICD-10-CM

## 2022-11-08 DIAGNOSIS — F339 Major depressive disorder, recurrent, unspecified: Secondary | ICD-10-CM | POA: Diagnosis not present

## 2022-11-08 DIAGNOSIS — M545 Low back pain, unspecified: Secondary | ICD-10-CM

## 2022-11-08 LAB — URINALYSIS, ROUTINE W REFLEX MICROSCOPIC
Bilirubin, UA: NEGATIVE
Glucose, UA: NEGATIVE
Ketones, UA: NEGATIVE
Leukocytes,UA: NEGATIVE
Nitrite, UA: NEGATIVE
RBC, UA: NEGATIVE
Specific Gravity, UA: >1.03 — ABNORMAL HIGH (ref 1.005–1.030)
Urobilinogen, Ur: 0.2 mg/dL (ref 0.2–1.0)
pH, UA: 6 (ref 5.0–7.5)

## 2022-11-08 MED ORDER — DICLOFENAC SODIUM 1 % EX GEL: 2.0000 g | Freq: Four times a day (QID) | CUTANEOUS | 0 refills | Status: DC

## 2022-11-08 MED ORDER — KETOROLAC TROMETHAMINE 30 MG/ML IJ SOLN
30.0000 mg | Freq: Once | INTRAMUSCULAR | Status: DC
Start: 2022-11-08 — End: 2022-11-08

## 2022-11-08 MED ORDER — KETOROLAC TROMETHAMINE 60 MG/2ML IM SOLN
30.0000 mg | Freq: Once | INTRAMUSCULAR | Status: AC
  Administered 2022-11-08: 30 mg via INTRAMUSCULAR

## 2022-11-08 NOTE — Telephone Encounter (Signed)
Reason for Disposition . [1] SEVERE back pain (e.g., excruciating, unable to do any normal activities) AND [2] not improved 2 hours after pain medicine  Answer Assessment - Initial Assessment Questions 1. ONSET: "When did the pain begin?"      Chronic back pain- worse, hurts to stand/walk, 1 week 2. LOCATION: "Where does it hurt?" (upper, mid or lower back)     Lower back pain- previous surgery 30 years + 3. SEVERITY: "How bad is the pain?"  (e.g., Scale 1-10; mild, moderate, or severe)   - MILD (1-3): Doesn't interfere with normal activities.    - MODERATE (4-7): Interferes with normal activities or awakens from sleep.    - SEVERE (8-10): Excruciating pain, unable to do any normal activities.      Severe 10/10 4. PATTERN: "Is the pain constant?" (e.g., yes, no; constant, intermittent)      Constant pain 5. RADIATION: "Does the pain shoot into your legs or somewhere else?"     Can go into buttock- left 6. CAUSE:  "What do you think is causing the back pain?"      Hx ruptured disc 7. BACK OVERUSE:  "Any recent lifting of heavy objects, strenuous work or exercise?"     Unsure- patient does everything- "only her" 8. MEDICINES: "What have you taken so far for the pain?" (e.g., nothing, acetaminophen, NSAIDS)     Tylenol, arthritis medication- naproxen  9. NEUROLOGIC SYMPTOMS: "Do you have any weakness, numbness, or problems with bowel/bladder control?"     Hard to have BM- advised fiber increase and softener 10. OTHER SYMPTOMS: "Do you have any other symptoms?" (e.g., fever, abdomen pain, burning with urination, blood in urine)       no  Protocols used: Back Pain-A-AH

## 2022-11-08 NOTE — Patient Instructions (Addendum)
Try heat to the area  Try voltaren gel on back   Alternate with Tylenol and Ibuprofen  Try Naproxen in the morning and tylenol in between for relief

## 2022-11-08 NOTE — Progress Notes (Unsigned)
BP (!) 175/101 Comment: Refused BP recheck  Pulse (!) 58   Temp 98.3 F (36.8 C) (Oral)   Wt 199 lb 9.6 oz (90.5 kg)   SpO2 97%   BMI 35.25 kg/m    Subjective:    Patient ID: Beth Parker, female    DOB: 26-Aug-1955, 67 y.o.   MRN: [id number]  HPI: Beth Parker is a 67 y.o. female  Chief Complaint  Patient presents with   Back Pain  Patient refused BP recheck  175/101  BACK PAIN Paint has worsened over the last week. She has history of back surgery and ruptured disc. She was told she needed to come see [place] for a xray referral, she went to see radiology to see. She has Naproxen. Pain has started to worsen. Referral placed for ortho, she is unsure if  Not able to  see psychiatry too far in [place]: needs location in [place]  Ortho need location in [place]:  Referral placed: physical therapy:  Naproxen.:  Against physcial thearpy;    Duration: 6 weeks but has worsened over the last two weeks.  Mechanism of injury: unknown Location: bilateral and low back Onset: gradual Severity: 10/10 Quality: aching and pressure-like Frequency: constant Radiation: R leg below the knee Aggravating factors: movement and prolonged sitting Alleviating factors:  Heat Status: worse Treatments attempted:  tylenol extra strength    Relief with NSAIDs?: no Nighttime pain:  yes Paresthesias / decreased sensation:  no Bowel / bladder incontinence:  {Blank single:19197::"yes","no"} Fevers:  no Dysuria / urinary frequency:  no   Relevant past medical, surgical, family and social history reviewed and updated as indicated. Interim medical history since our last visit reviewed. Allergies and medications reviewed and updated.  Review of Systems  Per HPI unless specifically indicated above     Objective:    BP (!) 175/101 Comment: Refused BP recheck  Pulse (!) 58   Temp 98.3 F (36.8 C) (Oral)   Wt 199 lb 9.6 oz (90.5 kg)   SpO2 97%   BMI 35.25 kg/m   Wt Readings from Last  3 Encounters:  11/08/22 199 lb 9.6 oz (90.5 kg)  10/24/22 197 lb 12.8 oz (89.7 kg)  09/12/22 195 lb 12.8 oz (88.8 kg)    Physical Exam Vitals and nursing note reviewed.  Constitutional:      General: She is awake. She is not in acute distress.    Appearance: Normal appearance. She is well-developed and well-groomed. She is not ill-appearing.  HENT:     Head: Normocephalic and atraumatic.     Right Ear: Hearing and external ear normal. No drainage.     Left Ear: Hearing and external ear normal. No drainage.     Nose: Nose normal.  Eyes:     General: Lids are normal.        Right eye: No discharge.        Left eye: No discharge.     Conjunctiva/sclera: Conjunctivae normal.  Cardiovascular:     Rate and Rhythm: Regular rhythm. Bradycardia present.     Pulses:          Radial pulses are 2+ on the right side and 2+ on the left side.       Posterior tibial pulses are 2+ on the right side and 2+ on the left side.     Heart sounds: Normal heart sounds, S1 normal and S2 normal. No murmur heard.    No gallop.  Pulmonary:  Effort: Pulmonary effort is normal. No accessory muscle usage or respiratory distress.     Breath sounds: Normal breath sounds.  Musculoskeletal:        General: Normal range of motion.     Cervical back: Full passive range of motion without pain and normal range of motion.     Right lower leg: No edema.     Left lower leg: No edema.  Skin:    General: Skin is warm and dry.     Capillary Refill: Capillary refill takes less than 2 seconds.  Neurological:     Mental Status: She is alert and oriented to person, place, and time.  Psychiatric:        Attention and Perception: Attention normal.        Mood and Affect: Mood normal.        Speech: Speech normal.        Behavior: Behavior normal. Behavior is cooperative.        Thought Content: Thought content normal.     Results for orders placed or performed in visit on 09/12/22  CBC with Differential/Platelet   Result Value Ref Range   WBC 6.8 3.4 - 10.8 x10E3/uL   RBC 4.40 3.77 - 5.28 x10E6/uL   Hemoglobin 13.4 11.1 - 15.9 g/dL   Hematocrit 16.1 09.6 - 46.6 %   MCV 94 79 - 97 fL   MCH 30.5 26.6 - 33.0 pg   MCHC 32.3 31.5 - 35.7 g/dL   RDW 04.5 40.9 - 81.1 %   Platelets 184 150 - 450 x10E3/uL   Neutrophils 56 Not Estab. %   Lymphs 31 Not Estab. %   Monocytes 9 Not Estab. %   Eos 3 Not Estab. %   Basos 1 Not Estab. %   Neutrophils Absolute 3.9 1.4 - 7.0 x10E3/uL   Lymphocytes Absolute 2.1 0.7 - 3.1 x10E3/uL   Monocytes Absolute 0.6 0.1 - 0.9 x10E3/uL   EOS (ABSOLUTE) 0.2 0.0 - 0.4 x10E3/uL   Basophils Absolute 0.0 0.0 - 0.2 x10E3/uL   Immature Granulocytes 0 Not Estab. %   Immature Grans (Abs) 0.0 0.0 - 0.1 x10E3/uL  Comprehensive metabolic panel  Result Value Ref Range   Glucose 102 (H) 70 - 99 mg/dL   BUN 19 8 - 27 mg/dL   Creatinine, Ser 9.14 (H) 0.57 - 1.00 mg/dL   eGFR 61 >78 GN/FAO/1.30   BUN/Creatinine Ratio 19 12 - 28   Sodium 142 134 - 144 mmol/L   Potassium 4.8 3.5 - 5.2 mmol/L   Chloride 104 96 - 106 mmol/L   CO2 25 20 - 29 mmol/L   Calcium 9.3 8.7 - 10.3 mg/dL   Total Protein 7.1 6.0 - 8.5 g/dL   Albumin 4.3 3.9 - 4.9 g/dL   Globulin, Total 2.8 1.5 - 4.5 g/dL   Bilirubin Total 0.4 0.0 - 1.2 mg/dL   Alkaline Phosphatase 102 44 - 121 IU/L   AST 28 0 - 40 IU/L   ALT 31 0 - 32 IU/L  Lipid Panel w/o Chol/HDL Ratio  Result Value Ref Range   Cholesterol, Total 169 100 - 199 mg/dL   Triglycerides 865 (H) 0 - 149 mg/dL   HDL 40 >78 mg/dL   VLDL Cholesterol Cal 31 5 - 40 mg/dL   LDL Chol Calc (NIH) 98 0 - 99 mg/dL  TSH  Result Value Ref Range   TSH 2.010 0.450 - 4.500 uIU/mL  HIV Antibody (routine testing w rflx)  Result Value Ref  Range   HIV Screen 4th Generation wRfx Non Reactive Non Reactive  Hepatitis C antibody  Result Value Ref Range   Hep C Virus Ab Non Reactive Non Reactive      Assessment & Plan:   Problem List Items Addressed This Visit    None Visit Diagnoses     Low back pain, unspecified back pain laterality, unspecified chronicity, unspecified whether sciatica present    -  Primary   Relevant Medications   ketorolac (TORADOL) 30 MG/ML injection 30 mg   Other Relevant Orders   Urinalysis, Routine w reflex microscopic   DG Lumbar Spine Complete        Follow up plan: Return if symptoms worsen or fail to improve.

## 2022-11-08 NOTE — Telephone Encounter (Signed)
  Chief Complaint: lower back pain- hx surgery- pain worse x 1 week Symptoms: severe lower back pain- some radiation into buttock-left Frequency: worse 1 week Pertinent Negatives: Patient denies fever, abdomen pain, burning with urination, blood in urine  Disposition: [] ED /[] Urgent Care (no appt availability in office) / [x] Appointment(In office/virtual)/ []  [facility]/ [] Home Care/ [] Refused Recommended Disposition /[] [facility]/ []  Follow-up with PCP Additional Notes: Patient is unsure if she did something to insure her back again.Patient is requesting x ray- her pain is severe for 1 week- heat and oral medication is not helping. Appointment scheduled- advised have someone else drive her- she has no one-proceed with caution.

## 2022-11-10 DIAGNOSIS — M5441 Lumbago with sciatica, right side: Secondary | ICD-10-CM | POA: Insufficient documentation

## 2022-11-10 NOTE — Assessment & Plan Note (Addendum)
Acute, ongoing. UA negative. Referral placed for ortho in [facility] location as requested for further management, patient refused physical therapy. Provided back exercises, Torodol 30 MG IM given in office today, recommend Voltaren gel use and heat to the are for relief along with alternating Naproxen and Tylenol daily. Order placed for Xray of lumbar spine. Return as needed, call sooner if concerns arise.

## 2022-11-10 NOTE — Assessment & Plan Note (Addendum)
PHQ: 6, GAD 7: 3. New referral placed for psychiatry as patient request [place] location.

## 2022-11-10 NOTE — Assessment & Plan Note (Addendum)
BP elevated in office today 175/101. She is taking Amlodipine 5 MG, Lisinopril 40 MG, and metoprolol 100 MG BID. She refused BP recheck today in office. Will follow up at appointment in 2 weeks.

## 2022-11-10 NOTE — Assessment & Plan Note (Signed)
PHQ: 6, GAD 7: 3. New referral placed for psychiatry as patient request [place] location.

## 2022-11-11 ENCOUNTER — Ambulatory Visit
Admission: RE | Admit: 2022-11-11 | Discharge: 2022-11-11 | Disposition: A | Discharge status: Home / Self Care | Payer: Medicare (Managed Care) | Source: Ambulatory Visit | Attending: Family Medicine | Admitting: Family Medicine

## 2022-11-11 ENCOUNTER — Ambulatory Visit
Admission: RE | Admit: 2022-11-11 | Discharge: 2022-11-11 | Disposition: A | Discharge status: Home / Self Care | Payer: Medicare (Managed Care) | Attending: Family Medicine | Admitting: Family Medicine

## 2022-11-11 DIAGNOSIS — M5441 Lumbago with sciatica, right side: Secondary | ICD-10-CM | POA: Insufficient documentation

## 2022-11-15 NOTE — Progress Notes (Signed)
Good morning, please let Beth Parker know her right knee imaging returned and there is severe arthritic changes, I do recommend she schedule with ortho for discussion of options for this.  Let us know if she does not hear from ortho.  Any questions? Keep being stellar!!  Thank you for allowing me to participate in your care.  I appreciate you. Kindest regards, Gunnard Dorrance

## 2022-11-20 NOTE — Progress Notes (Signed)
Called and informed patient, also gave her the number to where she was sent for orthopedics.

## 2022-11-25 ENCOUNTER — Ambulatory Visit: Payer: Self-pay

## 2022-11-25 NOTE — Telephone Encounter (Signed)
Routing to PCP and provider who saw the patient.

## 2022-11-25 NOTE — Telephone Encounter (Signed)
Please call patient and schedule visit to come in for injection per Jolene. This would be an office visit, not a nurse visit.

## 2022-11-25 NOTE — Telephone Encounter (Signed)
Copied from CRM [id number]. Topic: Appointment Scheduling - Scheduling Inquiry for Clinic >> Nov 25, 2022 11:33 AM Marlow Baars wrote: Reason for CRM: The patient called stating since she has to wait to see another Ortho as she requested another referral due to [facility] not seeing her for unpaid bills can she possibly come in for another shot like she got last month that helped with her pain? Please assist patient further.

## 2022-11-25 NOTE — Telephone Encounter (Signed)
     Chief Complaint: Low back pain. States "[facility] in [place] won't see me because I owe them money. I need to be referred somewhere else in [place]." Symptoms: Pain Frequency: "Long time" Pertinent Negatives: Patient denies  Disposition: [] ED /[] Urgent Care (no appt availability in office) / [] Appointment(In office/virtual)/ []  [facility]/ [] Home Care/ [x] Refused Recommended Disposition /[] [facility]/ []  Follow-up with PCP Additional Notes: Please advise. Also asking "for my sleeping pill again." Does not know the name of medication.  Reason for Disposition  [1] Pain radiates into the thigh or further down the leg AND [2] one leg  Answer Assessment - Initial Assessment Questions 1. ONSET: "When did the pain begin?"      On going 2. LOCATION: "Where does it hurt?" (upper, mid or lower back)     Low back and down leg 3. SEVERITY: "How bad is the pain?"  (e.g., Scale 1-10; mild, moderate, or severe)   - MILD (1-3): Doesn't interfere with normal activities.    - MODERATE (4-7): Interferes with normal activities or awakens from sleep.    - SEVERE (8-10): Excruciating pain, unable to do any normal activities.      10 4. PATTERN: "Is the pain constant?" (e.g., yes, no; constant, intermittent)      Constant 5. RADIATION: "Does the pain shoot into your legs or somewhere else?"     Leg 6. CAUSE:  "What do you think is causing the back pain?"      Unsure 7. BACK OVERUSE:  "Any recent lifting of heavy objects, strenuous work or exercise?"     No 8. MEDICINES: "What have you taken so far for the pain?" (e.g., nothing, acetaminophen, NSAIDS)     OTC 9. NEUROLOGIC SYMPTOMS: "Do you have any weakness, numbness, or problems with bowel/bladder control?"     No 10. OTHER SYMPTOMS: "Do you have any other symptoms?" (e.g., fever, abdomen pain, burning with urination, blood in urine)       No 11. PREGNANCY: "Is there any chance you are pregnant?" "When was your  last menstrual period?"       No  Protocols used: Back Pain-A-AH

## 2022-12-01 NOTE — Patient Instructions (Addendum)
Senna S or Colace try this -- take twice a day.  Insomnia Insomnia is a sleep disorder that makes it difficult to fall asleep or stay asleep. Insomnia can cause fatigue, low energy, difficulty concentrating, mood swings, and poor performance at work or school. There are three different ways to classify insomnia: Difficulty falling asleep. Difficulty staying asleep. Waking up too early in the morning. Any type of insomnia can be long-term (chronic) or short-term (acute). Both are common. Short-term insomnia usually lasts for 3 months or less. Chronic insomnia occurs at least three times a week for longer than 3 months. What are the causes? Insomnia may be caused by another condition, situation, or substance, such as: Having certain mental health conditions, such as anxiety and depression. Using caffeine, alcohol, tobacco, or drugs. Having gastrointestinal conditions, such as gastroesophageal reflux disease (GERD). Having certain medical conditions. These include: Asthma. Alzheimer's disease. Stroke. Chronic pain. An overactive thyroid gland (hyperthyroidism). Other sleep disorders, such as restless legs syndrome and sleep apnea. Menopause. Sometimes, the cause of insomnia may not be known. What increases the risk? Risk factors for insomnia include: Gender. Females are affected more often than males. Age. Insomnia is more common as people get older. Stress and certain medical and mental health conditions. Lack of exercise. Having an irregular work schedule. This may include working night shifts and traveling between different time zones. What are the signs or symptoms? If you have insomnia, the main symptom is having trouble falling asleep or having trouble staying asleep. This may lead to other symptoms, such as: Feeling tired or having low energy. Feeling nervous about going to sleep. Not feeling rested in the morning. Having trouble concentrating. Feeling irritable, anxious, or  depressed. How is this diagnosed? This condition may be diagnosed based on: Your symptoms and medical history. Your health care provider may ask about: Your sleep habits. Any medical conditions you have. Your mental health. A physical exam. How is this treated? Treatment for insomnia depends on the cause. Treatment may focus on treating an underlying condition that is causing the insomnia. Treatment may also include: Medicines to help you sleep. Counseling or therapy. Lifestyle adjustments to help you sleep better. Follow these instructions at home: Eating and drinking  Limit or avoid alcohol, caffeinated beverages, and products that contain nicotine and tobacco, especially close to bedtime. These can disrupt your sleep. Do not eat a large meal or eat spicy foods right before bedtime. This can lead to digestive discomfort that can make it hard for you to sleep. Sleep habits  Keep a sleep diary to help you and your health care provider figure out what could be causing your insomnia. Write down: When you sleep. When you wake up during the night. How well you sleep and how rested you feel the next day. Any side effects of medicines you are taking. What you eat and drink. Make your bedroom a dark, comfortable place where it is easy to fall asleep. Put up shades or blackout curtains to block light from outside. Use a white noise machine to block noise. Keep the temperature cool. Limit screen use before bedtime. This includes: Not watching TV. Not using your smartphone, tablet, or computer. Stick to a routine that includes going to bed and waking up at the same times every day and night. This can help you fall asleep faster. Consider making a quiet activity, such as reading, part of your nighttime routine. Try to avoid taking naps during the day so that you sleep better  at night. Get out of bed if you are still awake after 15 minutes of trying to sleep. Keep the lights down, but try  reading or doing a quiet activity. When you feel sleepy, go back to bed. General instructions Take over-the-counter and prescription medicines only as told by your health care provider. Exercise regularly as told by your health care provider. However, avoid exercising in the hours right before bedtime. Use relaxation techniques to manage stress. Ask your health care provider to suggest some techniques that may work well for you. These may include: Breathing exercises. Routines to release muscle tension. Visualizing peaceful scenes. Make sure that you drive carefully. Do not drive if you feel very sleepy. Keep all follow-up visits. This is important. Contact a health care provider if: You are tired throughout the day. You have trouble in your daily routine due to sleepiness. You continue to have sleep problems, or your sleep problems get worse. Get help right away if: You have thoughts about hurting yourself or someone else. Get help right away if you feel like you may hurt yourself or others, or have thoughts about taking your own life. Go to your nearest emergency room or: Call 911. Call the National Suicide Prevention Lifeline at 408 526 2938 or 988. This is open 24 hours a day. Text the Crisis Text Line at 613-126-9701. Summary Insomnia is a sleep disorder that makes it difficult to fall asleep or stay asleep. Insomnia can be long-term (chronic) or short-term (acute). Treatment for insomnia depends on the cause. Treatment may focus on treating an underlying condition that is causing the insomnia. Keep a sleep diary to help you and your health care provider figure out what could be causing your insomnia. This information is not intended to replace advice given to you by your health care provider. Make sure you discuss any questions you have with your health care provider. Document Revised: 12/18/2020 Document Reviewed: 12/18/2020 Elsevier Patient Education  2024 [employer].

## 2022-12-06 ENCOUNTER — Ambulatory Visit (INDEPENDENT_AMBULATORY_CARE_PROVIDER_SITE_OTHER): Payer: Medicare (Managed Care) | Admitting: Nurse Practitioner

## 2022-12-06 ENCOUNTER — Encounter: Payer: Self-pay | Admitting: Nurse Practitioner

## 2022-12-06 VITALS — BP 136/84 | HR 60 | Temp 98.1°F | Ht 63.1 in | Wt 197.2 lb

## 2022-12-06 DIAGNOSIS — F5104 Psychophysiologic insomnia: Secondary | ICD-10-CM | POA: Diagnosis not present

## 2022-12-06 DIAGNOSIS — F339 Major depressive disorder, recurrent, unspecified: Secondary | ICD-10-CM

## 2022-12-06 DIAGNOSIS — F419 Anxiety disorder, unspecified: Secondary | ICD-10-CM

## 2022-12-06 DIAGNOSIS — F1911 Other psychoactive substance abuse, in remission: Secondary | ICD-10-CM | POA: Diagnosis not present

## 2022-12-06 DIAGNOSIS — I10 Essential (primary) hypertension: Secondary | ICD-10-CM | POA: Diagnosis not present

## 2022-12-06 MED ORDER — METOPROLOL TARTRATE 100 MG PO TABS: 100.0000 mg | ORAL_TABLET | Freq: Two times a day (BID) | ORAL | 4 refills | Status: DC

## 2022-12-06 MED ORDER — HYDROXYZINE HCL 25 MG PO TABS: 25.0000 mg | ORAL_TABLET | Freq: Three times a day (TID) | ORAL | 4 refills | Status: DC | PRN

## 2022-12-06 NOTE — Assessment & Plan Note (Signed)
She denies any further use.  Would avoid any addictive medications on a long term basis with her due to concerns for abuse.

## 2022-12-06 NOTE — Assessment & Plan Note (Signed)
Chronic, ongoing.  Tried Ambien, Trazodone, and Melatonin in past with no benefit. Reports Belsomra does not help.  States Xanax is only thing that has helped her in past and is insistent it is only thing that will work, have had discussions on long term use of this with her + addictive element, although she reports not wanting to take anything addictive.  Discussed with her at length safe medications in patients 65 and up.  Will continue Belsomra 10 MG nightly and adjust as needed.  Recommend heavy focus on sleep hygiene.  Would benefit from sleep study in future, continues to decline.  Suspect some of her insomnia may be related to OSA.  Has referral to psychiatry, will work on getting this scheduled.

## 2022-12-06 NOTE — Assessment & Plan Note (Signed)
Chronic, ongoing.  BP stable on recheck today.  Recommend she monitor BP at least a few mornings a week at home and document.  DASH diet at home.  Continue current medication regimen and adjust as needed.  Labs today: up to date.  Refills sent.

## 2022-12-06 NOTE — Assessment & Plan Note (Addendum)
Chronic, ongoing.  Denies Si/HI.  Has strong faith in God per her report and goes to two different churches.  States Xanax is only thing that has helped her in past and is insistent it is only thing that will work, have had discussions on long term use of this with her + addictive element, although she reports not wanting to take anything addictive.  She has tried multiple medications in past.  Will continue Buspar 15 MG BID and Lexapro 5 MG daily.  Referral placed to psychiatry last visit since she has been on multiple medications in past and these did not work, will work on getting this scheduled.

## 2022-12-06 NOTE — Progress Notes (Signed)
BP 136/84 (BP Location: Left Arm, Patient Position: Sitting, Cuff Size: Large)   Pulse 60   Temp 98.1 F (36.7 C) (Oral)   Ht 5' 3.1" (1.603 m)   Wt 197 lb 3.2 oz (89.4 kg)   SpO2 99%   BMI 34.82 kg/m    Subjective:    Patient ID: Beth Parker, female    DOB: 1955/05/06, 67 y.o.   MRN: [id number]  HPI: Beth Parker is a 67 y.o. female  Chief Complaint  Patient presents with   Depression   Insomnia    Patient states that the Belsomra has not been helping    HYPERTENSION without Chronic Kidney Disease Taking all her medications for BP.   Hypertension status: uncontrolled  Satisfied with current treatment? yes Duration of hypertension: chronic BP monitoring frequency:  not checking BP range:  BP medication side effects:  no Medication compliance: good compliance Aspirin: no Recurrent headaches: no Visual changes: no Palpitations: no Dyspnea: no Chest pain: no Lower extremity edema: no Dizzy/lightheaded: no   DEPRESSION & INSOMNIA Continues Lexapro daily and Belsomra (started on 09/12/22) + Vistaril.  Takes Belsomra but reports no benefit from it.  Took Xanax in past from previous PCP in clinic many years ago and continues to report this is only thing that worked for mood and sleep.  Has been off of this for many years.  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  Mood status: stable Satisfied with current treatment?: yes Symptom severity: mild  Duration of current treatment : chronic Side effects: no Medication compliance: good compliance Psychotherapy/counseling: none Previous psychiatric medications:  Depressed mood: yes Anxious mood: yes Anhedonia: no Significant weight loss or gain: no Insomnia: yes hard to fall asleep Fatigue: yes Feelings of worthlessness or guilt: no Impaired concentration/indecisiveness: no Suicidal ideations: no Hopelessness: no Crying spells: no    12/06/2022   10:29 AM  11/08/2022   10:09 AM 10/24/2022    8:53 AM 09/12/2022    9:32 AM 12/25/2018    3:41 PM  Depression screen PHQ 2/9  Decreased Interest 2 0 0 1 0  Down, Depressed, Hopeless 1 0 0 1 3  PHQ - 2 Score 3 0 0 2 3  Altered sleeping 3 3 3 3 3   Tired, decreased energy 3 2 2 1 3   Change in appetite 1 0 0 1 0  Feeling bad or failure about yourself  0 0 0 1 0  Trouble concentrating 2 0 1 3 0  Moving slowly or fidgety/restless 0 1 0 0 0  Suicidal thoughts 0 0 1 0 0  PHQ-9 Score 12 6 7 11 9   Difficult doing work/chores Somewhat difficult Not difficult at all Somewhat difficult Somewhat difficult        12/06/2022   10:30 AM 11/08/2022   10:13 AM 10/24/2022    8:53 AM 09/12/2022    9:33 AM  GAD 7 : Generalized Anxiety Score  Nervous, Anxious, on Edge 1 0 0 2  Control/stop worrying 1 0 0 0  Worry too much - different things 1 0 0 0  Trouble relaxing 1 1 0 1  Restless 1 1 0 0  Easily annoyed or irritable 1 1 0 0  Afraid - awful might happen 1 0 1 0  Total GAD 7 Score 7 3 1 3   Anxiety Difficulty Not difficult at all Not difficult at all Somewhat difficult Somewhat difficult  Relevant past medical, surgical, family and social history reviewed and updated as indicated. Interim medical history since our last visit reviewed. Allergies and medications reviewed and updated.  Review of Systems  Constitutional:  Negative for activity change, appetite change, diaphoresis, fatigue and fever.  Respiratory:  Negative for cough, chest tightness, shortness of breath and wheezing.   Cardiovascular:  Negative for chest pain, palpitations and leg swelling.  Gastrointestinal: Negative.   Musculoskeletal:  Positive for back pain.  Neurological: Negative.   Psychiatric/Behavioral:  Positive for sleep disturbance. Negative for decreased concentration, self-injury and suicidal ideas. The patient is nervous/anxious.     Per HPI unless specifically indicated above     Objective:    BP 136/84 (BP Location:  Left Arm, Patient Position: Sitting, Cuff Size: Large)   Pulse 60   Temp 98.1 F (36.7 C) (Oral)   Ht 5' 3.1" (1.603 m)   Wt 197 lb 3.2 oz (89.4 kg)   SpO2 99%   BMI 34.82 kg/m   Wt Readings from Last 3 Encounters:  12/06/22 197 lb 3.2 oz (89.4 kg)  11/08/22 199 lb 9.6 oz (90.5 kg)  10/24/22 197 lb 12.8 oz (89.7 kg)    Physical Exam Vitals and nursing note reviewed.  Constitutional:      General: She is awake. She is not in acute distress.    Appearance: She is well-developed and well-groomed. She is obese. She is not ill-appearing or toxic-appearing.  HENT:     Head: Normocephalic.     Right Ear: Hearing and external ear normal.     Left Ear: Hearing and external ear normal.  Eyes:     General: Lids are normal.        Right eye: No discharge.        Left eye: No discharge.     Conjunctiva/sclera: Conjunctivae normal.     Pupils: Pupils are equal, round, and reactive to light.  Neck:     Thyroid: No thyromegaly.     Vascular: No carotid bruit.  Cardiovascular:     Rate and Rhythm: Normal rate and regular rhythm.     Heart sounds: Normal heart sounds. No murmur heard.    No gallop.  Pulmonary:     Effort: Pulmonary effort is normal. No accessory muscle usage or respiratory distress.     Breath sounds: Normal breath sounds.  Abdominal:     General: Bowel sounds are normal. There is no distension.     Palpations: Abdomen is soft.     Tenderness: There is no abdominal tenderness. There is no right CVA tenderness or left CVA tenderness.  Musculoskeletal:     Cervical back: Normal range of motion and neck supple.     Right lower leg: No edema.     Left lower leg: No edema.  Lymphadenopathy:     Cervical: No cervical adenopathy.  Skin:    General: Skin is warm and dry.  Neurological:     Mental Status: She is alert and oriented to person, place, and time.     Deep Tendon Reflexes: Reflexes are normal and symmetric.     Reflex Scores:      Brachioradialis reflexes are  2+ on the right side and 2+ on the left side.      Patellar reflexes are 2+ on the right side and 2+ on the left side. Psychiatric:        Attention and Perception: Attention normal.        Mood and Affect:  Mood normal.        Speech: Speech normal.        Behavior: Behavior normal. Behavior is cooperative.        Thought Content: Thought content normal.    Results for orders placed or performed in visit on 11/08/22  Urinalysis, Routine w reflex microscopic  Result Value Ref Range   Specific Gravity, UA >1.030 (H) 1.005 - 1.030   pH, UA 6.0 5.0 - 7.5   Color, UA Yellow Yellow   Appearance Ur Clear Clear   Leukocytes,UA Negative Negative   Protein,UA Trace (A) Negative/Trace   Glucose, UA Negative Negative   Ketones, UA Negative Negative   RBC, UA Negative Negative   Bilirubin, UA Negative Negative   Urobilinogen, Ur 0.2 0.2 - 1.0 mg/dL   Nitrite, UA Negative Negative   Microscopic Examination Comment       Assessment & Plan:   Problem List Items Addressed This Visit       Cardiovascular and Mediastinum   Essential hypertension    Chronic, ongoing.  BP stable on recheck today.  Recommend she monitor BP at least a few mornings a week at home and document.  DASH diet at home.  Continue current medication regimen and adjust as needed.  Labs today: up to date.  Refills sent.       Relevant Medications   metoprolol tartrate (LOPRESSOR) 100 MG tablet     Other   Anxiety    Chronic, ongoing.  Denies Si/HI.  Has strong faith in God per her report and goes to two different churches.  States Xanax is only thing that has helped her in past and is insistent it is only thing that will work, have had discussions on long term use of this with her + addictive element, although she reports not wanting to take anything addictive.  She has tried multiple medications in past.  Will continue Buspar 15 MG BID and Lexapro 5 MG daily.  Referral placed to psychiatry last visit since she has been on  multiple medications in past and these did not work, will work on getting this scheduled.      Relevant Medications   hydrOXYzine (ATARAX) 25 MG tablet   Depression, recurrent (HCC) - Primary    Chronic, ongoing.  Denies Si/HI.  Has strong faith in God per her report and goes to two different churches.  States Xanax is only thing that has helped her in past and is insistent it is only thing that will work, have had discussions on long term use of this with her + addictive element, although she reports not wanting to take anything addictive.  She has tried multiple medications in past.  Will continue Buspar 15 MG BID and Lexapro 5 MG daily.  Referral placed to psychiatry last visit since she has been on multiple medications in past and these did not work, will work on getting this scheduled.      Relevant Medications   hydrOXYzine (ATARAX) 25 MG tablet   History of drug abuse (HCC)    She denies any further use.  Would avoid any addictive medications on a long term basis with her due to concerns for abuse.      Insomnia    Chronic, ongoing.  Tried Ambien, Trazodone, and Melatonin in past with no benefit. Reports Belsomra does not help.  States Xanax is only thing that has helped her in past and is insistent it is only thing that will work, have had  discussions on long term use of this with her + addictive element, although she reports not wanting to take anything addictive.  Discussed with her at length safe medications in patients 65 and up.  Will continue Belsomra 10 MG nightly and adjust as needed.  Recommend heavy focus on sleep hygiene.  Would benefit from sleep study in future, continues to decline.  Suspect some of her insomnia may be related to OSA.  Has referral to psychiatry, will work on getting this scheduled.        Follow up plan: Return in about 3 months (around 03/08/2023) for Depression and HTN/HLD.

## 2022-12-10 NOTE — Addendum Note (Signed)
Encounter addended by: Weber Cooks, NP on: 12/10/2022 10:45 AM  Actions taken: Letter saved

## 2022-12-10 NOTE — Progress Notes (Signed)
Letter printed to be mailed.  °

## 2022-12-11 ENCOUNTER — Telehealth: Payer: Self-pay | Admitting: Nurse Practitioner

## 2022-12-11 NOTE — Telephone Encounter (Signed)
Pt is calling in because her insurance declined to pay for the imaging she had done on 11/11/22 due to it needing a prior authorization. Please follow up with pt.

## 2022-12-12 ENCOUNTER — Ambulatory Visit: Payer: Medicare (Managed Care) | Admitting: Orthopaedic Surgery

## 2022-12-12 DIAGNOSIS — M48062 Spinal stenosis, lumbar region with neurogenic claudication: Secondary | ICD-10-CM

## 2022-12-12 NOTE — Progress Notes (Signed)
Chief Complaint: Left elbow pain, lower back pain     History of Present Illness:    Beth Parker is a 67 y.o. female right dominant female presents with left elbow as well as lower back pain.  She does have a history of lumbar discectomy approximately 30 years prior.  Since this time she has had progressive pain going down the right lower extremity.  Her primary care doctor has been treating her for this including with Toradol which gave her some temporary relief.  She takes naproxen as needed.  She is experiencing pain shooting down the right side.  With regard to the left elbow she does have a history of a radial head fracture that was subsequently excised with a debridement performed 1 year prior with Dr. Gerlene Burdock.  This has been flaring up on her significantly particularly as the winter months been progressing.  She continues to have crepitus and pain with range of motion    PMH/PSH/Family History/Social History/Meds/Allergies:    Past Medical History:  Diagnosis Date   Anxiety    Arthritis    knee and back, h/o chronic L1 fracture   Back pain    h/o L1 fracture   Depression    Fractured elbow    GERD (gastroesophageal reflux disease)    High cholesterol    Hypertension    Migraine    Overactive bladder    Rib fracture    R 5th, 01/2011   Past Surgical History:  Procedure Laterality Date   ABDOMINAL HYSTERECTOMY  01/22/1984   Right ovary remains   ANKLE SURGERY Left 01/22/2003   BREAST BIOPSY  08/23/2010   right (benign)   CERVICAL DISC SURGERY  01/22/1992   CESAREAN SECTION     COLONOSCOPY N/A 05/30/2014   Procedure: COLONOSCOPY;  Surgeon: Wallace Cullens, MD;  Location: [facility] ENDOSCOPY;  Service: Gastroenterology;  Laterality: N/A;   ESOPHAGOGASTRODUODENOSCOPY N/A 05/30/2014   Procedure: ESOPHAGOGASTRODUODENOSCOPY (EGD);  Surgeon: Wallace Cullens, MD;  Location: [facility]COPY;  Service: Gastroenterology;  Laterality: N/A;   HEMORRHOID SURGERY     internal and external    TUBAL LIGATION     WRIST SURGERY     right (ganglion cyst)   Social History   Socioeconomic History   Marital status: Legally Separated    Spouse name: Not on file   Number of children: 1   Years of education: Not on file   Highest education level: Not on file  Occupational History   Occupation: hairdresser, retired  Tobacco Use   Smoking status: Former   Smokeless tobacco: Never   Tobacco comments:    quit over 8 years ago  Vaping Use   Vaping status: Never Used  Substance and Sexual Activity   Alcohol use: No   Drug use: Not Currently   Sexual activity: Not Currently  Other Topics Concern   Not on file  Social History Narrative   Not on file   Social Determinants of Health   Financial Resource Strain: Medium Risk (09/12/2022)   Overall Financial Resource Strain (CARDIA)    Difficulty of Paying Living Expenses: Somewhat hard  Food Insecurity: No Food Insecurity (09/12/2022)   Hunger Vital Sign    Worried About Running Out of Food in the Last Year: Never true    Ran Out of Food in the Last Year: Never true  Transportation Needs: No Transportation Needs (09/12/2022)   PRAPARE - Administrator, Civil Service (Medical): No  Lack of Transportation (Non-Medical): No  Physical Activity: Inactive (09/12/2022)   Exercise Vital Sign    Days of Exercise per Week: 0 days    Minutes of Exercise per Session: 0 min  Stress: No Stress Concern Present (09/12/2022)   [employer] of Occupational Health - Occupational Stress Questionnaire    Feeling of Stress : Only a little  Social Connections: Moderately Isolated (09/12/2022)   Social Connection and Isolation Panel [NHANES]    Frequency of Communication with Friends and Family: More than three times a week    Frequency of Social Gatherings with Friends and Family: More than three times a week    Attends Religious Services: 1 to 4 times per year    Active Member of [employer] or Organizations: No    Attends Tax inspector Meetings: Never    Marital Status: Widowed   Family History  Problem Relation Age of Onset   Hypertension Mother    Alzheimer's disease Mother    Cancer Father        prostate   Mental illness Father    Mental illness Sister    Breast cancer Sister 66   Cancer Sister        breast cancer   Hypertension Brother    Bipolar disorder Daughter    Colon cancer Neg Hx    Allergies  Allergen Reactions   Fluphenazine     tremor   Current Outpatient Medications  Medication Sig Dispense Refill   Acetaminophen (TYLENOL PO) Take by mouth as needed. 500 mg tablet. Taking 9 tablets a day     amLODipine (NORVASC) 5 MG tablet Take 1 tablet (5 mg total) by mouth daily. 90 tablet 4   atorvastatin (LIPITOR) 40 MG tablet Take 1 tablet (40 mg total) by mouth daily. 90 tablet 4   busPIRone (BUSPAR) 15 MG tablet Take 1 tablet (15 mg total) by mouth 2 (two) times daily. 180 tablet 2   diclofenac Sodium (VOLTAREN) 1 % GEL Apply 2 g topically 4 (four) times daily. 2 g 0   escitalopram (LEXAPRO) 5 MG tablet Take 1 tablet (5 mg total) by mouth daily. 90 tablet 4   gabapentin (NEURONTIN) 300 MG capsule Take 1 capsule (300 mg total) by mouth at bedtime. 90 capsule 4   hydrOXYzine (ATARAX) 25 MG tablet Take 1 tablet (25 mg total) by mouth every 8 (eight) hours as needed. 45 tablet 4   lisinopril (ZESTRIL) 40 MG tablet Take 1 tablet (40 mg total) by mouth daily. 90 tablet 4   metoprolol tartrate (LOPRESSOR) 100 MG tablet Take 1 tablet (100 mg total) by mouth 2 (two) times daily. 90 tablet 4   naproxen (NAPROSYN) 500 MG tablet Take 1 tablet by mouth 2 (two) times daily as needed.     omeprazole (PRILOSEC) 40 MG capsule Take 1 capsule (40 mg total) by mouth daily. 90 capsule 4   oxybutynin (DITROPAN XL) 10 MG 24 hr tablet Take 2 tablets (20 mg total) by mouth at bedtime. 180 tablet 4   Suvorexant (BELSOMRA) 10 MG TABS Take 1 tablet (10 mg total) by mouth at bedtime as needed. 30 tablet 1    triamcinolone cream (KENALOG) 0.1 % Apply 1 Application topically 2 (two) times daily. 45 g 2   No current facility-administered medications for this visit.   No results found.  Review of Systems:   A ROS was performed including pertinent positives and negatives as documented in the HPI.  Physical Exam :  Constitutional: NAD and appears stated age Neurological: Alert and oriented Psych: Appropriate affect and cooperative There were no vitals taken for this visit.   Comprehensive Musculoskeletal Exam:    Left elbow with positive crepitus.  Range of motion is from 20 to 130 degrees with pain.  Incision is well-healed.  Limited pro supination  Right lower back with positive straight leg raise to the right.  Remainder of distal neurosensory exam is intact.  Reflexes are symmetric and equal bilaterally   Imaging:   Xray (lumbar spine 4 views): Advanced degenerative disc disease at L4-L5    I personally reviewed and interpreted the radiographs.   Assessment and Plan:   67 y.o. 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  This time I do believe that she would be a candidate for possible total elbow arthroplasty and to that effect we will plan for referral back to [facility] and Dr. Gerlene Burdock to see if she would be a candidate for this.  With regard to her lumbar spine I do believe an MRI would be helpful to assess for critical stenosis that she does have a history of lumbar discectomy with quite progressive degenerative disc disease and spondylosis on x-rays today.  Will plan for an MRI and follow-up to discuss results   I personally saw and evaluated the patient, and participated in the management and treatment plan.  Huel Cote, MD Attending Physician, Orthopedic Surgery  This document was dictated using Dragon voice recognition software. A reasonable attempt at proof reading has been made  to minimize errors.

## 2022-12-16 ENCOUNTER — Other Ambulatory Visit: Payer: Self-pay | Admitting: Nurse Practitioner

## 2022-12-16 ENCOUNTER — Ambulatory Visit: Payer: Self-pay | Admitting: *Deleted

## 2022-12-16 DIAGNOSIS — G8929 Other chronic pain: Secondary | ICD-10-CM

## 2022-12-16 NOTE — Addendum Note (Signed)
Addended by: Aura Dials T on: 12/16/2022 04:54 PM   Modules accepted: Orders

## 2022-12-16 NOTE — Telephone Encounter (Signed)
Called and notified patient of Jolene's message. Patient is requesting to be referred to a pain clinic.

## 2022-12-16 NOTE — Telephone Encounter (Signed)
Routing to provider to advise.  

## 2022-12-16 NOTE — Telephone Encounter (Signed)
  Chief Complaint: worsening pain left elbow and low back, requesting pain medication until appt 01/09/23 with [facility] Symptoms: worsening pain left elbow mild swelling to wrist , Tingling in fingers at times. Naprosyn 500 mg , voltaren gel, tylenol ES not effective.  Frequency: approx 1 month but worsening today  Pertinent Negatives: Patient denies inability to move arm no fever  Disposition: [] ED /[] Urgent Care (no appt availability in office) / [] Appointment(In office/virtual)/ []  [facility]/ [] Home Care/ [x] Refused Recommended Disposition /[]  Mobile Bus/ []  Follow-up with PCP Additional Notes:   Offered appt tomorrow. Patient reports PCP aware of severe pain and issues with left elbow. Has seen ortho care and neurologist. Patient concerned about taking naprosyn affecting her kidneys. Patient requesting pain med called to pharmacy due to she has other meds to pick up today . Please advise.       Reason for Disposition  [1] MODERATE pain (e.g., interferes with normal activities) AND [2] present > 3 days  Answer Assessment - Initial Assessment Questions 1. ONSET: "When did the pain start?"     1 month ago and worsening today  2. LOCATION: "Where is the pain located?"     Left arm elbow ,low back  3. PAIN: "How bad is the pain?" (Scale 1-10; or mild, moderate, severe)   - MILD (1-3): Doesn't interfere with normal activities.   - MODERATE (4-7): Interferes with normal activities (e.g., work or school) or awakens from sleep.   - SEVERE (8-10): Excruciating pain, unable to do any normal activities, unable to hold a cup of water.     Not able to sleep due to pain  4. WORK OR EXERCISE: "Has there been any recent work or exercise that involved this part of the body?"     na 5. CAUSE: "What do you think is causing the arm pain?"     Left elbow pain  6. OTHER SYMPTOMS: "Do you have any other symptoms?" (e.g., neck pain, swelling, rash, fever, numbness, weakness)     Mild  swelling towards wrist hand tingling at times. Pain left elbow and low back  7. PREGNANCY: "Is there any chance you are pregnant?" "When was your last menstrual period?"     na  Protocols used: Arm Pain-A-AH

## 2023-01-03 ENCOUNTER — Ambulatory Visit: Payer: Self-pay

## 2023-01-03 NOTE — Telephone Encounter (Signed)
  Chief Complaint: urinary frequency, burning pain, back pain Symptoms: above Frequency: yesterday Pertinent Negatives: Patient denies fever Disposition: [] ED /[x] Urgent Care (no appt availability in office) / [] Appointment(In office/virtual)/ []  [facility]/ [] Home Care/ [] Refused Recommended Disposition /[] [place] [facility]/ []  Follow-up with PCP Additional Notes: Pt states that s/s started yesterday. Pt states that pain is constant. Frequent urination, burning sensation. Back pain. Pt will go to UC for care this weekend.   Summary: UtI Sx advice   Pt is calling to report that she has a UTI with back pain and bladder pain. Please advise     Reason for Disposition  Side (flank) or lower back pain present  Answer Assessment - Initial Assessment Questions 1. SYMPTOM: "What's the main symptom you're concerned about?" (e.g., frequency, incontinence)     Frequency, painful, back pain 2. ONSET: "When did the  s/s  start?"     yesterday 3. PAIN: "Is there any pain?" If Yes, ask: "How bad is it?" (Scale: 1-10; mild, moderate, severe)     All the time 4. CAUSE: "What do you think is causing the symptoms?"     UTI 5. OTHER SYMPTOMS: "Do you have any other symptoms?" (e.g., blood in urine, fever, flank pain, pain with urination)     Back pain - might be swollen  Protocols used: Urinary Symptoms-A-AH

## 2023-01-03 NOTE — Telephone Encounter (Signed)
Summary: UtI Sx advice   Pt is calling to report that she has a UTI with back pain and bladder pain. Please advise     Called pt - left message on machine to return our call.

## 2023-01-06 NOTE — Telephone Encounter (Signed)
Left message for patient to follow up to see if she went to local Urgent Care per provider. Advised patient to give our office a call back.

## 2023-01-07 ENCOUNTER — Telehealth (HOSPITAL_BASED_OUTPATIENT_CLINIC_OR_DEPARTMENT_OTHER): Payer: Self-pay

## 2023-01-07 ENCOUNTER — Encounter: Payer: Self-pay | Admitting: Nurse Practitioner

## 2023-01-07 ENCOUNTER — Ambulatory Visit (INDEPENDENT_AMBULATORY_CARE_PROVIDER_SITE_OTHER): Payer: Medicare (Managed Care) | Admitting: Nurse Practitioner

## 2023-01-07 VITALS — BP 161/82 | HR 65 | Temp 97.2°F | Ht 63.1 in | Wt 195.8 lb

## 2023-01-07 DIAGNOSIS — R8290 Unspecified abnormal findings in urine: Secondary | ICD-10-CM

## 2023-01-07 DIAGNOSIS — N3001 Acute cystitis with hematuria: Secondary | ICD-10-CM | POA: Diagnosis not present

## 2023-01-07 LAB — URINALYSIS, ROUTINE W REFLEX MICROSCOPIC
Bilirubin, UA: NEGATIVE
Glucose, UA: NEGATIVE
Ketones, UA: NEGATIVE
Nitrite, UA: NEGATIVE
Specific Gravity, UA: 1.025 (ref 1.005–1.030)
Urobilinogen, Ur: 0.2 mg/dL (ref 0.2–1.0)
pH, UA: 6 (ref 5.0–7.5)

## 2023-01-07 LAB — MICROSCOPIC EXAMINATION: Bacteria, UA: NONE SEEN

## 2023-01-07 MED ORDER — CIPROFLOXACIN HCL 500 MG PO TABS: 500.0000 mg | ORAL_TABLET | Freq: Two times a day (BID) | ORAL | 0 refills | Status: AC

## 2023-01-07 NOTE — Telephone Encounter (Signed)
LVM for pt to r/s appt since MRI hasn't been done yet

## 2023-01-07 NOTE — Telephone Encounter (Signed)
Spoke with patient and says she did not go to the Urgent Care due to having to wait for a long period of time. Patient says she still feels horrible and drinking a lot of water hoping it has cleared up. Patient is scheduled for visit with Larae Grooms, NP at 3:20 today.

## 2023-01-07 NOTE — Progress Notes (Signed)
BP (!) 161/82 (BP Location: Left Arm, Patient Position: Sitting, Cuff Size: Large)   Pulse 65   Temp (!) 97.2 F (36.2 C) (Oral)   Ht 5' 3.1" (1.603 m)   Wt 195 lb 12.8 oz (88.8 kg)   SpO2 98%   BMI 34.57 kg/m    Subjective:    Patient ID: Beth Parker, female    DOB: [birth year], 67 y.o.   MRN: [id number]  HPI: Beth Parker is a 67 y.o. female  Chief Complaint  Patient presents with   Urinary Tract Infection   URINARY SYMPTOMS Symptoms started last Thursday  Dysuria: no Urinary frequency: yes Urgency: yes Small volume voids: yes Symptom severity: no Urinary incontinence: no Foul odor: no Hematuria: no Abdominal pain: no Back pain: yes Suprapubic pain/pressure: yes Flank pain: no Fever:  no Vomiting: no Relief with cranberry juice: no Relief with pyridium: no Status: stable Previous urinary tract infection: no Recurrent urinary tract infection: no Sexual activity: No sexually active/monogomous/practicing safe sex History of sexually transmitted disease: no Penile discharge: no Treatments attempted: increasing fluids   Relevant past medical, surgical, family and social history reviewed and updated as indicated. Interim medical history since our last visit reviewed. Allergies and medications reviewed and updated.  Review of Systems  Constitutional:  Negative for fever.  Gastrointestinal:  Negative for abdominal pain and vomiting.  Genitourinary:  Positive for dysuria and frequency. Negative for decreased urine volume, flank pain, hematuria and urgency.  Musculoskeletal:  Negative for back pain.    Per HPI unless specifically indicated above     Objective:    BP (!) 161/82 (BP Location: Left Arm, Patient Position: Sitting, Cuff Size: Large)   Pulse 65   Temp (!) 97.2 F (36.2 C) (Oral)   Ht 5' 3.1" (1.603 m)   Wt 195 lb 12.8 oz (88.8 kg)   SpO2 98%   BMI 34.57 kg/m   Wt Readings from Last 3 Encounters:  01/07/23 195 lb 12.8 oz (88.8 kg)   12/06/22 197 lb 3.2 oz (89.4 kg)  11/08/22 199 lb 9.6 oz (90.5 kg)    Physical Exam Vitals and nursing note reviewed.  Constitutional:      General: She is not in acute distress.    Appearance: Normal appearance. She is normal weight. She is not ill-appearing, toxic-appearing or diaphoretic.  HENT:     Head: Normocephalic.     Right Ear: External ear normal.     Left Ear: External ear normal.     Nose: Nose normal.     Mouth/Throat:     Mouth: Mucous membranes are moist.     Pharynx: Oropharynx is clear.  Eyes:     General:        Right eye: No discharge.        Left eye: No discharge.     Extraocular Movements: Extraocular movements intact.     Conjunctiva/sclera: Conjunctivae normal.     Pupils: Pupils are equal, round, and reactive to light.  Cardiovascular:     Rate and Rhythm: Normal rate and regular rhythm.     Heart sounds: No murmur heard. Pulmonary:     Effort: Pulmonary effort is normal. No respiratory distress.     Breath sounds: Normal breath sounds. No wheezing or rales.  Abdominal:     General: Abdomen is flat. Bowel sounds are normal. There is no distension.     Palpations: Abdomen is soft.     Tenderness: There is abdominal tenderness. There  is right CVA tenderness and left CVA tenderness. There is no guarding.  Musculoskeletal:     Cervical back: Normal range of motion and neck supple.  Skin:    General: Skin is warm and dry.     Capillary Refill: Capillary refill takes less than 2 seconds.  Neurological:     General: No focal deficit present.     Mental Status: She is alert and oriented to person, place, and time. Mental status is at baseline.  Psychiatric:        Mood and Affect: Mood normal.        Behavior: Behavior normal.        Thought Content: Thought content normal.        Judgment: Judgment normal.     Results for orders placed or performed in visit on 11/08/22  Urinalysis, Routine w reflex microscopic   Collection Time: 11/08/22 10:20  AM  Result Value Ref Range   Specific Gravity, UA >1.030 (H) 1.005 - 1.030   pH, UA 6.0 5.0 - 7.5   Color, UA Yellow Yellow   Appearance Ur Clear Clear   Leukocytes,UA Negative Negative   Protein,UA Trace (A) Negative/Trace   Glucose, UA Negative Negative   Ketones, UA Negative Negative   RBC, UA Negative Negative   Bilirubin, UA Negative Negative   Urobilinogen, Ur 0.2 0.2 - 1.0 mg/dL   Nitrite, UA Negative Negative   Microscopic Examination Comment       Assessment & Plan:   Problem List Items Addressed This Visit   None Visit Diagnoses       Cloudy urine    -  Primary   Relevant Orders   Urinalysis, Routine w reflex microscopic     Acute cystitis with hematuria       Ciprofloxacin sent to the pharmacy. Increase fluid intake. Urine sent for culture. Follow up if not improved.   Relevant Orders   Urine Culture        Follow up plan: Return if symptoms worsen or fail to improve.

## 2023-01-09 ENCOUNTER — Ambulatory Visit: Payer: Medicare (Managed Care)

## 2023-01-12 LAB — URINE CULTURE

## 2023-02-10 ENCOUNTER — Telehealth: Payer: Self-pay | Admitting: Nurse Practitioner

## 2023-02-10 NOTE — Telephone Encounter (Signed)
Copied from CRM [id number]. Topic: Medicare AWV >> Feb 10, 2023 11:44 AM Payton Doughty wrote: Reason for CRM: Called 02/10/2023 to sched AWV - Line Busy   Verlee Rossetti; Care Guide Ambulatory Clinical Support [place] [facility] Direct Dial: [phone]

## 2023-03-08 DIAGNOSIS — E538 Deficiency of other specified B group vitamins: Secondary | ICD-10-CM | POA: Insufficient documentation

## 2023-03-08 NOTE — Patient Instructions (Signed)
 Please call to schedule your mammogram and/or bone density: [facility]  Address: [address], [place], [place] [address] Phone: [phone]  [facility] [address] [place],  [place]  [address] Phone: [phone]   Healthy Eating, Adult Healthy eating may help you get and keep a healthy body weight, reduce the risk of chronic disease, and live a long and productive life. It is important to follow a healthy eating pattern. Your nutritional and calorie needs should be met mainly by different nutrient-rich foods. What are tips for following this plan? Reading food labels Read labels and choose the following: Reduced or low sodium products. Juices with 100% fruit juice. Foods with low saturated fats (<3 g per serving) and high polyunsaturated and monounsaturated fats. Foods with whole grains, such as whole wheat, cracked wheat, brown rice, and wild rice. Whole grains that are fortified with folic acid. This is recommended for females who are pregnant or who want to become pregnant. Read labels and do not eat or drink the following: Foods or drinks with added sugars. These include foods that contain brown sugar, corn sweetener, corn syrup, dextrose, fructose, glucose, high-fructose corn syrup, honey, invert sugar, lactose, malt syrup, maltose, molasses, raw sugar, sucrose, trehalose, or turbinado sugar. Limit your intake of added sugars to less than 10% of your total daily calories. Do not eat more than the following amounts of added sugar per day: 6 teaspoons (25 g) for females. 9 teaspoons (38 g) for males. Foods that contain processed or refined starches and grains. Refined grain products, such as white flour, degermed cornmeal, white bread, and white rice. Shopping Choose nutrient-rich snacks, such as vegetables, whole fruits, and nuts. Avoid high-calorie and high-sugar snacks, such as potato chips, fruit  snacks, and candy. Use oil-based dressings and spreads on foods instead of solid fats such as butter, margarine, sour cream, or cream cheese. Limit pre-made sauces, mixes, and "instant" products such as flavored rice, instant noodles, and ready-made pasta. Try more plant-protein sources, such as tofu, tempeh, black beans, edamame, lentils, nuts, and seeds. Explore eating plans such as the Mediterranean diet or vegetarian diet. Try heart-healthy dips made with beans and healthy fats like hummus and guacamole. Vegetables go great with these. Cooking Use oil to saut or stir-fry foods instead of solid fats such as butter, margarine, or lard. Try baking, boiling, grilling, or broiling instead of frying. Remove the fatty part of meats before cooking. Steam vegetables in water or broth. Meal planning  At meals, imagine dividing your plate into fourths: One-half of your plate is fruits and vegetables. One-fourth of your plate is whole grains. One-fourth of your plate is protein, especially lean meats, poultry, eggs, tofu, beans, or nuts. Include low-fat dairy as part of your daily diet. Lifestyle Choose healthy options in all settings, including home, work, school, restaurants, or stores. Prepare your food safely: Wash your hands after handling raw meats. Where you prepare food, keep surfaces clean by regularly washing with hot, soapy water. Keep raw meats separate from ready-to-eat foods, such as fruits and vegetables. Cook seafood, meat, poultry, and eggs to the recommended temperature. Get a food thermometer. Store foods at safe temperatures. In general: Keep cold foods at 75F (4.4C) or below. Keep hot foods at 175F (60C) or above. Keep your freezer at [facility] (-17.8C) or below. Foods are not safe to eat if they have been between the temperatures of 40-175F (4.4-60C) for more than  2 hours. What foods should I eat? Fruits Aim to eat 1-2 cups of fresh, canned (in natural juice), or  frozen fruits each day. One cup of fruit equals 1 small apple, 1 large banana, 8 large strawberries, 1 cup (237 g) canned fruit,  cup (82 g) dried fruit, or 1 cup (240 mL) 100% juice. Vegetables Aim to eat 2-4 cups of fresh and frozen vegetables each day, including different varieties and colors. One cup of vegetables equals 1 cup (91 g) broccoli or cauliflower florets, 2 medium carrots, 2 cups (150 g) raw, leafy greens, 1 large tomato, 1 large bell pepper, 1 large sweet potato, or 1 medium white potato. Grains Aim to eat 5-10 ounce-equivalents of whole grains each day. Examples of 1 ounce-equivalent of grains include 1 slice of bread, 1 cup (40 g) ready-to-eat cereal, 3 cups (24 g) popcorn, or  cup (93 g) cooked rice. Meats and other proteins Try to eat 5-7 ounce-equivalents of protein each day. Examples of 1 ounce-equivalent of protein include 1 egg,  oz nuts (12 almonds, 24 pistachios, or 7 walnut halves), 1/4 cup (90 g) cooked beans, 6 tablespoons (90 g) hummus or 1 tablespoon (16 g) peanut butter. A cut of meat or fish that is the size of a deck of cards is about 3-4 ounce-equivalents (85 g). Of the protein you eat each week, try to have at least 8 sounce (227 g) of seafood. This is about 2 servings per week. This includes salmon, trout, herring, sardines, and anchovies. Dairy Aim to eat 3 cup-equivalents of fat-free or low-fat dairy each day. Examples of 1 cup-equivalent of dairy include 1 cup (240 mL) milk, 8 ounces (250 g) yogurt, 1 ounces (44 g) natural cheese, or 1 cup (240 mL) fortified soy milk. Fats and oils Aim for about 5 teaspoons (21 g) of fats and oils per day. Choose monounsaturated fats, such as canola and olive oils, mayonnaise made with olive oil or avocado oil, avocados, peanut butter, and most nuts, or polyunsaturated fats, such as sunflower, corn, and soybean oils, walnuts, pine nuts, sesame seeds, sunflower seeds, and flaxseed. Beverages Aim for 6 eight-ounce glasses of  water per day. Limit coffee to 3-5 eight-ounce cups per day. Limit caffeinated beverages that have added calories, such as soda and energy drinks. If you drink alcohol: Limit how much you have to: 0-1 drink a day if you are female. 0-2 drinks a day if you are female. Know how much alcohol is in your drink. In the [place] one drink is one 12 oz bottle of beer (355 mL), one 5 oz glass of wine (148 mL), or one 1 oz glass of hard liquor (44 mL). Seasoning and other foods Try not to add too much salt to your food. Try using herbs and spices instead of salt. Try not to add sugar to food. This information is based on [place] nutrition guidelines. To learn more, visit [employer]. Exact amounts may vary. You may need different amounts. This information is not intended to replace advice given to you by your health care provider. Make sure you discuss any questions you have with your health care provider. Document Revised: 10/08/2021 Document Reviewed: 10/08/2021 Elsevier Patient Education  2024 [employer].

## 2023-03-10 ENCOUNTER — Ambulatory Visit: Payer: Medicare (Managed Care) | Admitting: Nurse Practitioner

## 2023-03-10 ENCOUNTER — Encounter: Payer: Self-pay | Admitting: Nurse Practitioner

## 2023-03-10 VITALS — BP 148/86 | HR 61 | Temp 97.9°F | Ht 63.0 in | Wt 194.0 lb

## 2023-03-10 DIAGNOSIS — E782 Mixed hyperlipidemia: Secondary | ICD-10-CM | POA: Diagnosis not present

## 2023-03-10 DIAGNOSIS — F1911 Other psychoactive substance abuse, in remission: Secondary | ICD-10-CM

## 2023-03-10 DIAGNOSIS — F339 Major depressive disorder, recurrent, unspecified: Secondary | ICD-10-CM

## 2023-03-10 DIAGNOSIS — R7301 Impaired fasting glucose: Secondary | ICD-10-CM

## 2023-03-10 DIAGNOSIS — F5104 Psychophysiologic insomnia: Secondary | ICD-10-CM

## 2023-03-10 DIAGNOSIS — I10 Essential (primary) hypertension: Secondary | ICD-10-CM | POA: Diagnosis not present

## 2023-03-10 DIAGNOSIS — M545 Low back pain, unspecified: Secondary | ICD-10-CM

## 2023-03-10 DIAGNOSIS — Z1231 Encounter for screening mammogram for malignant neoplasm of breast: Secondary | ICD-10-CM

## 2023-03-10 DIAGNOSIS — Z780 Asymptomatic menopausal state: Secondary | ICD-10-CM

## 2023-03-10 DIAGNOSIS — G8929 Other chronic pain: Secondary | ICD-10-CM

## 2023-03-10 DIAGNOSIS — F419 Anxiety disorder, unspecified: Secondary | ICD-10-CM

## 2023-03-10 DIAGNOSIS — E559 Vitamin D deficiency, unspecified: Secondary | ICD-10-CM

## 2023-03-10 DIAGNOSIS — E538 Deficiency of other specified B group vitamins: Secondary | ICD-10-CM

## 2023-03-10 MED ORDER — NYSTATIN 100000 UNIT/GM EX POWD: 1.0000 | Freq: Three times a day (TID) | CUTANEOUS | 2 refills | Status: DC

## 2023-03-10 MED ORDER — HYDRALAZINE HCL 25 MG PO TABS: 25.0000 mg | ORAL_TABLET | Freq: Two times a day (BID) | ORAL | 4 refills | Status: DC

## 2023-03-10 MED ORDER — BUSPIRONE HCL 15 MG PO TABS: 15.0000 mg | ORAL_TABLET | Freq: Three times a day (TID) | ORAL | 1 refills | Status: DC

## 2023-03-10 MED ORDER — FLUCONAZOLE 150 MG PO TABS: 150.0000 mg | ORAL_TABLET | Freq: Once | ORAL | 0 refills | Status: AC

## 2023-03-10 MED ORDER — NYSTATIN 100000 UNIT/GM EX CREA: 1.0000 | TOPICAL_CREAM | Freq: Two times a day (BID) | CUTANEOUS | 1 refills | Status: DC

## 2023-03-10 NOTE — Assessment & Plan Note (Signed)
 Chronic, ongoing.  States she needs something stronger than OTC medications or Gabapentin.  Refuses to take Gabapentin due to concern for injury to her one kidney, which she reports having only one kidney, although do not see report on this.  Discussed with there this is renal dosed at present, but she refuses to take.  Saw ortho recently, but has not obtained MRI.  Referral to pain clinic placed, she reports ortho recommended this as well.

## 2023-03-10 NOTE — Progress Notes (Signed)
 BP (!) 148/86 (BP Location: Left Arm, Patient Position: Sitting, Cuff Size: Large)   Pulse 61   Temp 97.9 F (36.6 C) (Oral)   Ht 5\' 3"  (1.6 m)   Wt 194 lb (88 kg)   SpO2 98%   BMI 34.37 kg/m    Subjective:    Patient ID: Beth Parker, female    DOB: [birth year], 68 y.o.   MRN: [id number]  HPI: Beth Parker is a 68 y.o. female  Chief Complaint  Patient presents with   Depression   Hypertension   Hyperlipidemia   Medication Problem    Pt states she need another medication for Buspirone because she only have one kidney and that's not a good med One of the medication give her a rash but she dont know which one it is     HYPERTENSION / HYPERLIPIDEMIA Taking Lisinopril, Metoprolol.  Not taking Amlodipine, concern for side effects she read about like hair loss. Continues Atorvastatin.  Continues to endorse rash Satisfied with current treatment? yes Duration of hypertension: chronic BP monitoring frequency: monthly BP range: elevated at home BP medication side effects: no Duration of hyperlipidemia: chronic Cholesterol medication side effects: no Cholesterol supplements: none Medication compliance: fair compliance Aspirin: no Recent stressors: no Recurrent headaches: no Visual changes: no Palpitations: no Dyspnea: no Chest pain: no Lower extremity edema: no Dizzy/lightheaded: no   CHRONIC PAIN  Saw orthopedics last on 12/12/22, with plan for MRI lumbar spine, which she has not obtained. Not taking Gabapentin, she is concerned about her kidneys with this -- as has one kidney she reports.  History of low Vitamin D and B12 levels -- to be taking supplements.  Would like to see a pain management provider, has seen on in past she reports. Pain control status: stable Duration: chronic Location: lower back and sometimes elbows and knees Quality: dull, aching, and throbbing Current Pain Level: 4/10 Previous Pain Level: 7/10 Breakthrough pain: no Benefit from narcotic  medications: yes in past What Activities task can be accomplished with current medication? Can perform ADL's Stool softners/OTC fiber: no  Previous pain specialty evaluation: no Non-narcotic analgesic meds: no Narcotic contract: no   DEPRESSION & INSOMNIA Never did take Lexapro as she does not want to mess with her brain.  Buspar she is taking and would like increased.  Taking Belsomra for sleep, but continues to report she prefers Xanax.  Took Xanax in past from previous PCP in clinic many years ago and continues to report this is only thing that worked for mood and sleep.  Has been off of this for many years.  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  Mood status: stable Satisfied with current treatment?: yes Symptom severity: mild  Duration of current treatment : chronic Side effects: no Medication compliance: good compliance Psychotherapy/counseling: none Previous psychiatric medications:  Depressed mood: yes Anxious mood: yes Anhedonia: no Significant weight loss or gain: no Insomnia: yes hard to fall asleep Fatigue: yes Feelings of worthlessness or guilt: no Impaired concentration/indecisiveness: no Suicidal ideations: no Hopelessness: no Crying spells: no    03/10/2023   11:13 AM 12/06/2022   10:29 AM 11/08/2022   10:09 AM 10/24/2022    8:53 AM 09/12/2022    9:32 AM  Depression screen PHQ 2/9  Decreased Interest 0 2 0 0 1  Down, Depressed, Hopeless 0 1 0 0 1  PHQ - 2 Score 0 3 0  0 2  Altered sleeping 1 3 3 3 3   Tired, decreased energy 0 3 2 2 1   Change in appetite 2 1 0 0 1  Feeling bad or failure about yourself  0 0 0 0 1  Trouble concentrating 0 2 0 1 3  Moving slowly or fidgety/restless 0 0 1 0 0  Suicidal thoughts 0 0 0 1 0  PHQ-9 Score 3 12 6 7 11   Difficult doing work/chores Not difficult at all Somewhat difficult Not difficult at all Somewhat difficult Somewhat difficult       03/10/2023   11:13 AM  12/06/2022   10:30 AM 11/08/2022   10:13 AM 10/24/2022    8:53 AM  GAD 7 : Generalized Anxiety Score  Nervous, Anxious, on Edge 3 1 0 0  Control/stop worrying 0 1 0 0  Worry too much - different things 0 1 0 0  Trouble relaxing 0 1 1 0  Restless 0 1 1 0  Easily annoyed or irritable 0 1 1 0  Afraid - awful might happen 0 1 0 1  Total GAD 7 Score 3 7 3 1   Anxiety Difficulty  Not difficult at all Not difficult at all Somewhat difficult    Relevant past medical, surgical, family and social history reviewed and updated as indicated. Interim medical history since our last visit reviewed. Allergies and medications reviewed and updated.  Review of Systems  Constitutional:  Negative for activity change, appetite change, diaphoresis, fatigue and fever.  Respiratory:  Negative for cough, chest tightness, shortness of breath and wheezing.   Cardiovascular:  Negative for chest pain, palpitations and leg swelling.  Gastrointestinal: Negative.   Musculoskeletal:  Positive for back pain.  Neurological: Negative.   Psychiatric/Behavioral:  Positive for sleep disturbance. Negative for decreased concentration, self-injury and suicidal ideas. The patient is nervous/anxious.     Per HPI unless specifically indicated above     Objective:    BP (!) 148/86 (BP Location: Left Arm, Patient Position: Sitting, Cuff Size: Large)   Pulse 61   Temp 97.9 F (36.6 C) (Oral)   Ht 5\' 3"  (1.6 m)   Wt 194 lb (88 kg)   SpO2 98%   BMI 34.37 kg/m   Wt Readings from Last 3 Encounters:  03/10/23 194 lb (88 kg)  01/07/23 195 lb 12.8 oz (88.8 kg)  12/06/22 197 lb 3.2 oz (89.4 kg)    Physical Exam Vitals and nursing note reviewed.  Constitutional:      General: She is awake. She is not in acute distress.    Appearance: She is well-developed and well-groomed. She is obese. She is not ill-appearing or toxic-appearing.  HENT:     Head: Normocephalic.     Right Ear: Hearing and external ear normal.     Left  Ear: Hearing and external ear normal.  Eyes:     General: Lids are normal.        Right eye: No discharge.        Left eye: No discharge.     Conjunctiva/sclera: Conjunctivae normal.     Pupils: Pupils are equal, round, and reactive to light.  Neck:     Thyroid: No thyromegaly.     Vascular: No carotid bruit.  Cardiovascular:     Rate and Rhythm: Normal rate and regular rhythm.     Heart sounds: Normal heart sounds. No murmur heard.    No gallop.  Pulmonary:     Effort: Pulmonary effort is normal. No  accessory muscle usage or respiratory distress.     Breath sounds: Normal breath sounds.  Abdominal:     General: Bowel sounds are normal. There is no distension.     Palpations: Abdomen is soft.     Tenderness: There is no abdominal tenderness. There is no right CVA tenderness or left CVA tenderness.  Musculoskeletal:     Cervical back: Normal range of motion and neck supple.     Right lower leg: No edema.     Left lower leg: No edema.  Lymphadenopathy:     Cervical: No cervical adenopathy.  Skin:    General: Skin is warm and dry.  Neurological:     Mental Status: She is alert and oriented to person, place, and time.     Deep Tendon Reflexes: Reflexes are normal and symmetric.     Reflex Scores:      Brachioradialis reflexes are 2+ on the right side and 2+ on the left side.      Patellar reflexes are 2+ on the right side and 2+ on the left side. Psychiatric:        Attention and Perception: Attention normal.        Mood and Affect: Mood normal.        Speech: Speech normal.        Behavior: Behavior normal. Behavior is cooperative.        Thought Content: Thought content normal.    Results for orders placed or performed in visit on 01/07/23  Microscopic Examination   Collection Time: 01/07/23  3:22 PM   Urine  Result Value Ref Range   WBC, UA 6-10 (A) 0 - 5 /hpf   RBC, Urine 11-30 (A) 0 - 2 /hpf   Epithelial Cells (non renal) 0-10 0 - 10 /hpf   Mucus, UA Present (A)  Not Estab.   Bacteria, UA None seen None seen/Few  Urinalysis, Routine w reflex microscopic   Collection Time: 01/07/23  3:22 PM  Result Value Ref Range   Specific Gravity, UA 1.025 1.005 - 1.030   pH, UA 6.0 5.0 - 7.5   Color, UA Yellow Yellow   Appearance Ur Cloudy (A) Clear   Leukocytes,UA 1+ (A) Negative   Protein,UA 2+ (A) Negative/Trace   Glucose, UA Negative Negative   Ketones, UA Negative Negative   RBC, UA 3+ (A) Negative   Bilirubin, UA Negative Negative   Urobilinogen, Ur 0.2 0.2 - 1.0 mg/dL   Nitrite, UA Negative Negative   Microscopic Examination See below:   Urine Culture   Collection Time: 01/07/23  3:26 PM   Specimen: Urine   UR  Result Value Ref Range   Urine Culture, Routine Final report (A)    Organism ID, Bacteria Escherichia coli (A)    ORGANISM ID, BACTERIA Comment    Antimicrobial Susceptibility Comment       Assessment & Plan:   Problem List Items Addressed This Visit       Cardiovascular and Mediastinum   Essential hypertension   Chronic, ongoing.  BP with elevations on checks today, she never started Amlodipine as instructed because she did not like the side effects she read about. Discussed at length that these side effects are rare, but her risk for stroke or damage to her kidney are higher with poor BP control.  She still refuses to take Amlodipine.  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  Recommend she monitor BP at least a few mornings a week at home and document.  DASH diet at home.  Continue current medication regimen and adjust as needed + add on Hydralazine 25 MG BID -- instructed her to take this as ordered and educated on this and side effects.  Labs today: refuses.      Relevant Medications   hydrALAZINE (APRESOLINE) 25 MG tablet     Other   Anxiety   Chronic, ongoing.  Denies Si/HI.  Has strong faith in God per her report and goes to two different churches.  States Xanax is only thing that has helped her in past and is insistent it is only  thing that will work, have had discussions on long term use of this with her + addictive element, although she reports not wanting to take anything addictive or harmful for her brain.  She has tried multiple medications in past.  Will continue Buspar and increase to 15 MG TID, she is taking this and scores are improving.  She did not take Lexapro due to concerns for harm to her brain.  Referral placed to psychiatry past visit since she has been on multiple medications in past and these did not work, she has not scheduled.      Relevant Medications   busPIRone (BUSPAR) 15 MG tablet   Chronic back pain   Chronic, ongoing.  States she needs something stronger than OTC medications or Gabapentin.  Refuses to take Gabapentin due to concern for injury to her one kidney, which she reports having only one kidney, although do not see report on this.  Discussed with there this is renal dosed at present, but she refuses to take.  Saw ortho recently, but has not obtained MRI.  Referral to pain clinic placed, she reports ortho recommended this as well.      Relevant Medications   naproxen (EC NAPROSYN) 500 MG EC tablet   Other Relevant Orders   Ambulatory referral to Pain Clinic   Depression, recurrent (HCC) - Primary   Chronic, ongoing.  Denies Si/HI.  Has strong faith in God per her report and goes to two different churches.  States Xanax is only thing that has helped her in past and is insistent it is only thing that will work, have had discussions on long term use of this with her + addictive element, although she reports not wanting to take anything addictive or harmful for her brain.  She has tried multiple medications in past.  Will continue Buspar and increase to 15 MG TID, she is taking this and scores are improving.  She did not take Lexapro due to concerns for harm to her brain.  Referral placed to psychiatry past visit since she has been on multiple medications in past and these did not work, she has not  scheduled.      Relevant Medications   busPIRone (BUSPAR) 15 MG tablet   History of drug abuse (HCC)   She denies any further use.  Would avoid any addictive medications on a long term basis with her due to concerns for abuse.      Insomnia   Chronic, ongoing.  Tried Ambien, Trazodone, and Melatonin in past with no benefit. Reports Belsomra does not help.  States Xanax is only thing that has helped her in past and is insistent it is only thing that will work, have had discussions on long term use of this with her + addictive element, although she reports not wanting to take anything addictive or harmful to the brain.  Discussed with her at length safe  medications in patients 65 and up.  Will continue Belsomra 10 MG nightly and adjust as needed.  Recommend heavy focus on sleep hygiene.  Would benefit from sleep study in future, continues to decline.  Suspect some of her insomnia may be related to OSA.  Has referral to psychiatry, but has not scheduled.      Mixed hyperlipidemia   Chronic, ongoing.  Continue current medication regimen and adjust as needed.  Lipid panel and CMP next visit.      Relevant Medications   hydrALAZINE (APRESOLINE) 25 MG tablet   Other Visit Diagnoses       Postmenopausal estrogen deficiency       DEXA ordered and instructed how to schedule.   Relevant Orders   DG Bone Density     Encounter for screening mammogram for malignant neoplasm of breast       Mammogram ordered and instructed how to schedule.   Relevant Orders   MM 3D SCREENING MAMMOGRAM BILATERAL BREAST        Follow up plan: Return in about 4 weeks (around 04/07/2023) for HTN.

## 2023-03-10 NOTE — Assessment & Plan Note (Signed)
 Chronic, ongoing.  Denies Si/HI.  Has strong faith in God per her report and goes to two different churches.  States Xanax is only thing that has helped her in past and is insistent it is only thing that will work, have had discussions on long term use of this with her + addictive element, although she reports not wanting to take anything addictive or harmful for her brain.  She has tried multiple medications in past.  Will continue Buspar and increase to 15 MG TID, she is taking this and scores are improving.  She did not take Lexapro due to concerns for harm to her brain.  Referral placed to psychiatry past visit since she has been on multiple medications in past and these did not work, she has not scheduled.

## 2023-03-10 NOTE — Assessment & Plan Note (Signed)
Chronic, ongoing.  Continue current medication regimen and adjust as needed.  Lipid panel and CMP next visit. 

## 2023-03-10 NOTE — Assessment & Plan Note (Signed)
 Chronic, ongoing.  Tried Ambien, Trazodone, and Melatonin in past with no benefit. Reports Belsomra does not help.  States Xanax is only thing that has helped her in past and is insistent it is only thing that will work, have had discussions on long term use of this with her + addictive element, although she reports not wanting to take anything addictive or harmful to the brain.  Discussed with her at length safe medications in patients 65 and up.  Will continue Belsomra 10 MG nightly and adjust as needed.  Recommend heavy focus on sleep hygiene.  Would benefit from sleep study in future, continues to decline.  Suspect some of her insomnia may be related to OSA.  Has referral to psychiatry, but has not scheduled.

## 2023-03-10 NOTE — Assessment & Plan Note (Signed)
 Chronic, ongoing.  BP with elevations on checks today, she never started Amlodipine as instructed because she did not like the side effects she read about. Discussed at length that these side effects are rare, but her risk for stroke or damage to her kidney are higher with poor BP control.  She still refuses to take Amlodipine.  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  Recommend she monitor BP at least a few mornings a week at home and document.  DASH diet at home.  Continue current medication regimen and adjust as needed + add on Hydralazine 25 MG BID -- instructed her to take this as ordered and educated on this and side effects.  Labs today: refuses.

## 2023-03-10 NOTE — Assessment & Plan Note (Signed)
 She denies any further use.  Would avoid any addictive medications on a long term basis with her due to concerns for abuse.

## 2023-04-05 NOTE — Patient Instructions (Incomplete)

## 2023-04-07 ENCOUNTER — Encounter: Payer: Self-pay | Admitting: Nurse Practitioner

## 2023-04-07 ENCOUNTER — Ambulatory Visit (INDEPENDENT_AMBULATORY_CARE_PROVIDER_SITE_OTHER): Payer: Medicare (Managed Care) | Admitting: Nurse Practitioner

## 2023-04-07 VITALS — BP 136/74 | HR 56 | Temp 97.6°F | Ht 63.0 in | Wt 193.7 lb

## 2023-04-07 DIAGNOSIS — G8929 Other chronic pain: Secondary | ICD-10-CM

## 2023-04-07 DIAGNOSIS — M545 Low back pain, unspecified: Secondary | ICD-10-CM

## 2023-04-07 DIAGNOSIS — E782 Mixed hyperlipidemia: Secondary | ICD-10-CM | POA: Diagnosis not present

## 2023-04-07 DIAGNOSIS — N62 Hypertrophy of breast: Secondary | ICD-10-CM | POA: Insufficient documentation

## 2023-04-07 DIAGNOSIS — I10 Essential (primary) hypertension: Secondary | ICD-10-CM | POA: Diagnosis not present

## 2023-04-07 MED ORDER — METOPROLOL TARTRATE 100 MG PO TABS: 100.0000 mg | ORAL_TABLET | Freq: Two times a day (BID) | ORAL | 4 refills | Status: DC

## 2023-04-07 MED ORDER — ATORVASTATIN CALCIUM 40 MG PO TABS: 40.0000 mg | ORAL_TABLET | Freq: Every day | ORAL | 4 refills | Status: DC

## 2023-04-07 MED ORDER — HYDRALAZINE HCL 25 MG PO TABS: ORAL_TABLET | ORAL | 4 refills | Status: DC

## 2023-04-07 MED ORDER — LISINOPRIL 40 MG PO TABS: 40.0000 mg | ORAL_TABLET | Freq: Every day | ORAL | 4 refills | Status: DC

## 2023-04-07 MED ORDER — OMEPRAZOLE 40 MG PO CPDR: 40.0000 mg | DELAYED_RELEASE_CAPSULE | Freq: Every day | ORAL | 4 refills | Status: DC

## 2023-04-07 NOTE — Assessment & Plan Note (Signed)
 Ongoing, suspect this causes a lot of her back pain.  ?whether reduction would help her overall pain, suspect it would offer some benefit and she is interested in this.  Will place referral to plastics.

## 2023-04-07 NOTE — Assessment & Plan Note (Signed)
 Chronic, ongoing.  States she needs something stronger than OTC medications or Gabapentin.  Refuses to take Gabapentin due to concern for injury to her one kidney, which she reports having only one kidney, although do not see report on this.  Discussed with there this is renal dosed at present, but she refuses to take.  Saw ortho recently, but has not obtained MRI.  Referral to pain clinic placed last visit, provided her number to call.

## 2023-04-07 NOTE — Assessment & Plan Note (Signed)
Chronic, ongoing.  Continue current medication regimen and adjust as needed.  Lipid panel and CMP next visit. 

## 2023-04-07 NOTE — Assessment & Plan Note (Signed)
 Chronic, ongoing.  BP trending down significantly at home and on average <130/80.  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  Refuses Amlodipine in past Recommend she monitor BP at least a few mornings a week at home and document.  DASH diet at home.  Continue current medication regimen and adjust as needed + continue Hydralazine 25 MG but change to TID -- she can still take twice a day but may take extra if BP >140/90.  Instructed her to take this as ordered and educated on this and side effects.  Labs today: refuses.

## 2023-04-07 NOTE — Progress Notes (Signed)
 BP 136/74 (BP Location: Left Arm, Patient Position: Sitting, Cuff Size: Normal)   Pulse (!) 56   Temp 97.6 F (36.4 C) (Oral)   Ht 5\' 3"  (1.6 m)   Wt 193 lb 11.2 oz (87.9 kg)   SpO2 98%   BMI 34.31 kg/m    Subjective:    Patient ID: Beth Parker, female    DOB: 03-22-1955, 68 y.o.   MRN: [id number]  HPI: Beth Parker is a 68 y.o. female  Chief Complaint  Patient presents with   Hypertension    Added on Hydralazine on 03/10/23   HYPERTENSION without Chronic Kidney Disease Follow-up today for HTN, she refused to take Amlodipine in past due to side effects she read about. Added on Hydrazaline 03/10/23.  Continues Metoprolol and Lisinopril.  Atorvastatin for HLD.  Continues to have back pain, reports not sure if pain clinic has called and does not want surgery.  Wishes to have breast reduction, which would help pain.  Reports a lot of pain from the weight. Hypertension status: stable  Satisfied with current treatment? yes Duration of hypertension: chronic BP monitoring frequency:  a few times a week BP range: 99/52 to 180/99 -- these are trending down, on average <130/80 BP medication side effects:  no Medication compliance: good compliance Aspirin: no Recurrent headaches: no Visual changes: no Palpitations: no Dyspnea: no Chest pain:occasional tightness Lower extremity edema: no Dizzy/lightheaded: no   Relevant past medical, surgical, family and social history reviewed and updated as indicated. Interim medical history since our last visit reviewed. Allergies and medications reviewed and updated.  Review of Systems  Constitutional:  Negative for activity change, appetite change, diaphoresis, fatigue and fever.  Respiratory:  Negative for cough, chest tightness, shortness of breath and wheezing.   Cardiovascular:  Negative for chest pain, palpitations and leg swelling.  Gastrointestinal: Negative.   Musculoskeletal:  Positive for back pain.  Neurological: Negative.    Psychiatric/Behavioral:  Negative for decreased concentration, self-injury, sleep disturbance and suicidal ideas. The patient is nervous/anxious.     Per HPI unless specifically indicated above     Objective:    BP 136/74 (BP Location: Left Arm, Patient Position: Sitting, Cuff Size: Normal)   Pulse (!) 56   Temp 97.6 F (36.4 C) (Oral)   Ht 5\' 3"  (1.6 m)   Wt 193 lb 11.2 oz (87.9 kg)   SpO2 98%   BMI 34.31 kg/m   Wt Readings from Last 3 Encounters:  04/07/23 193 lb 11.2 oz (87.9 kg)  03/10/23 194 lb (88 kg)  01/07/23 195 lb 12.8 oz (88.8 kg)    Physical Exam Vitals and nursing note reviewed.  Constitutional:      General: She is awake. She is not in acute distress.    Appearance: She is well-developed and well-groomed. She is obese. She is not ill-appearing or toxic-appearing.  HENT:     Head: Normocephalic.     Right Ear: Hearing and external ear normal.     Left Ear: Hearing and external ear normal.  Eyes:     General: Lids are normal.        Right eye: No discharge.        Left eye: No discharge.     Conjunctiva/sclera: Conjunctivae normal.     Pupils: Pupils are equal, round, and reactive to light.  Neck:     Thyroid: No thyromegaly.     Vascular: No carotid bruit.  Cardiovascular:     Rate and Rhythm:  Normal rate and regular rhythm.     Heart sounds: Normal heart sounds. No murmur heard.    No gallop.  Pulmonary:     Effort: Pulmonary effort is normal. No accessory muscle usage or respiratory distress.     Breath sounds: Normal breath sounds.  Abdominal:     General: Bowel sounds are normal. There is no distension.     Palpations: Abdomen is soft.     Tenderness: There is no abdominal tenderness.  Musculoskeletal:     Cervical back: Normal range of motion and neck supple.     Right lower leg: No edema.     Left lower leg: No edema.  Lymphadenopathy:     Cervical: No cervical adenopathy.  Skin:    General: Skin is warm and dry.  Neurological:      Mental Status: She is alert and oriented to person, place, and time.     Deep Tendon Reflexes: Reflexes are normal and symmetric.     Reflex Scores:      Brachioradialis reflexes are 2+ on the right side and 2+ on the left side.      Patellar reflexes are 2+ on the right side and 2+ on the left side. Psychiatric:        Attention and Perception: Attention normal.        Mood and Affect: Mood normal.        Speech: Speech normal.        Behavior: Behavior normal. Behavior is cooperative.        Thought Content: Thought content normal.     Results for orders placed or performed in visit on 01/07/23  Microscopic Examination   Collection Time: 01/07/23  3:22 PM   Urine  Result Value Ref Range   WBC, UA 6-10 (A) 0 - 5 /hpf   RBC, Urine 11-30 (A) 0 - 2 /hpf   Epithelial Cells (non renal) 0-10 0 - 10 /hpf   Mucus, UA Present (A) Not Estab.   Bacteria, UA None seen None seen/Few  Urinalysis, Routine w reflex microscopic   Collection Time: 01/07/23  3:22 PM  Result Value Ref Range   Specific Gravity, UA 1.025 1.005 - 1.030   pH, UA 6.0 5.0 - 7.5   Color, UA Yellow Yellow   Appearance Ur Cloudy (A) Clear   Leukocytes,UA 1+ (A) Negative   Protein,UA 2+ (A) Negative/Trace   Glucose, UA Negative Negative   Ketones, UA Negative Negative   RBC, UA 3+ (A) Negative   Bilirubin, UA Negative Negative   Urobilinogen, Ur 0.2 0.2 - 1.0 mg/dL   Nitrite, UA Negative Negative   Microscopic Examination See below:   Urine Culture   Collection Time: 01/07/23  3:26 PM   Specimen: Urine   UR  Result Value Ref Range   Urine Culture, Routine Final report (A)    Organism ID, Bacteria Escherichia coli (A)    ORGANISM ID, BACTERIA Comment    Antimicrobial Susceptibility Comment       Assessment & Plan:   Problem List Items Addressed This Visit       Cardiovascular and Mediastinum   Essential hypertension - Primary   Chronic, ongoing.  BP trending down significantly at home and on average  <130/80.  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  Refuses Amlodipine in past Recommend she monitor BP at least a few mornings a week at home and document.  DASH diet at home.  Continue current medication regimen and adjust as  needed + continue Hydralazine 25 MG but change to TID -- she can still take twice a day but may take extra if BP >140/90.  Instructed her to take this as ordered and educated on this and side effects.  Labs today: refuses.      Relevant Medications   hydrALAZINE (APRESOLINE) 25 MG tablet   atorvastatin (LIPITOR) 40 MG tablet   lisinopril (ZESTRIL) 40 MG tablet   metoprolol tartrate (LOPRESSOR) 100 MG tablet     Other   Chronic back pain   Chronic, ongoing.  States she needs something stronger than OTC medications or Gabapentin.  Refuses to take Gabapentin due to concern for injury to her one kidney, which she reports having only one kidney, although do not see report on this.  Discussed with there this is renal dosed at present, but she refuses to take.  Saw ortho recently, but has not obtained MRI.  Referral to pain clinic placed last visit, provided her number to call.      Relevant Orders   Ambulatory referral to Plastic Surgery   Macromastia   Ongoing, suspect this causes a lot of her back pain.  ?whether reduction would help her overall pain, suspect it would offer some benefit and she is interested in this.  Will place referral to plastics.      Relevant Orders   Ambulatory referral to Plastic Surgery   Mixed hyperlipidemia   Chronic, ongoing.  Continue current medication regimen and adjust as needed.  Lipid panel and CMP next visit.      Relevant Medications   hydrALAZINE (APRESOLINE) 25 MG tablet   atorvastatin (LIPITOR) 40 MG tablet   lisinopril (ZESTRIL) 40 MG tablet   metoprolol tartrate (LOPRESSOR) 100 MG tablet     Follow up plan: Return in about 5 months (around 09/07/2023) for HTN/HLD, ANXIETY, IFG, CHRONIC PAIN.

## 2023-04-29 ENCOUNTER — Encounter: Payer: Self-pay | Admitting: Nurse Practitioner

## 2023-04-29 ENCOUNTER — Ambulatory Visit (INDEPENDENT_AMBULATORY_CARE_PROVIDER_SITE_OTHER): Payer: Medicare (Managed Care) | Admitting: Nurse Practitioner

## 2023-04-29 VITALS — BP 133/84 | HR 75 | Temp 98.6°F | Resp 16 | Ht 62.99 in | Wt 187.4 lb

## 2023-04-29 DIAGNOSIS — J0110 Acute frontal sinusitis, unspecified: Secondary | ICD-10-CM | POA: Diagnosis not present

## 2023-04-29 MED ORDER — AMOXICILLIN 500 MG PO CAPS: 500.0000 mg | ORAL_CAPSULE | Freq: Two times a day (BID) | ORAL | 0 refills | Status: DC

## 2023-04-29 NOTE — Progress Notes (Signed)
 BP 133/84 (BP Location: Right Arm, Patient Position: Sitting, Cuff Size: Large)   Pulse 75   Temp 98.6 F (37 C) (Oral)   Resp 16   Ht 5' 2.99" (1.6 m)   Wt 187 lb 6.4 oz (85 kg)   SpO2 99%   BMI 33.20 kg/m    Subjective:    Patient ID: Beth Parker, female    DOB: [birth year], 68 y.o.   MRN: [id number]  HPI: Beth Parker is a 68 y.o. female  Chief Complaint  Patient presents with   Sinus Problem    Last Wednesday 04/23/23, left church Sunday. Started with ear sounds then progressed to runny nose, sinus pressure, coughing up, sore throat. No OTC meds at this time.    UPPER RESPIRATORY TRACT INFECTION Worst symptom: symptoms started last week.  Worsened on Sunday.  States she was having a hard time breathing because her nose was so clogged up Fever: no Cough: yes Shortness of breath: no Wheezing: no Chest pain: no Chest tightness: yes Chest congestion: no Nasal congestion: yes Runny nose: yes Post nasal drip: yes Sneezing: yes Sore throat: yes Swollen glands: no Sinus pressure: yes Headache: yes Face pain: yes Toothache: no Ear pain: no bilateral Ear pressure: yes bilateral Eyes red/itching:no Eye drainage/crusting: no  Vomiting: no Rash: no Fatigue: no Sick contacts: no Strep contacts: no  Context: stable Recurrent sinusitis: no Relief with OTC cold/cough medications: no  Treatments attempted: none   Relevant past medical, surgical, family and social history reviewed and updated as indicated. Interim medical history since our last visit reviewed. Allergies and medications reviewed and updated.  Review of Systems  Constitutional:  Negative for fatigue and fever.  HENT:  Positive for congestion, postnasal drip, rhinorrhea, sinus pressure, sinus pain, sneezing and sore throat. Negative for dental problem and ear pain.   Respiratory:  Positive for cough. Negative for shortness of breath and wheezing.   Cardiovascular:  Negative for chest pain.   Gastrointestinal:  Negative for vomiting.  Skin:  Negative for rash.  Neurological:  Positive for headaches.    Per HPI unless specifically indicated above     Objective:    BP 133/84 (BP Location: Right Arm, Patient Position: Sitting, Cuff Size: Large)   Pulse 75   Temp 98.6 F (37 C) (Oral)   Resp 16   Ht 5' 2.99" (1.6 m)   Wt 187 lb 6.4 oz (85 kg)   SpO2 99%   BMI 33.20 kg/m   Wt Readings from Last 3 Encounters:  04/29/23 187 lb 6.4 oz (85 kg)  04/07/23 193 lb 11.2 oz (87.9 kg)  03/10/23 194 lb (88 kg)    Physical Exam Vitals and nursing note reviewed.  Constitutional:      General: She is not in acute distress.    Appearance: Normal appearance. She is normal weight. She is not ill-appearing, toxic-appearing or diaphoretic.  HENT:     Head: Normocephalic.     Right Ear: External ear normal. A middle ear effusion is present.     Left Ear: External ear normal. A middle ear effusion is present.     Nose: Congestion and rhinorrhea present.     Right Sinus: Frontal sinus tenderness present. No maxillary sinus tenderness.     Left Sinus: Frontal sinus tenderness present. No maxillary sinus tenderness.     Mouth/Throat:     Mouth: Mucous membranes are moist.     Pharynx: Oropharynx is clear. Posterior oropharyngeal erythema present. No  oropharyngeal exudate.  Eyes:     General:        Right eye: No discharge.        Left eye: No discharge.     Extraocular Movements: Extraocular movements intact.     Conjunctiva/sclera: Conjunctivae normal.     Pupils: Pupils are equal, round, and reactive to light.  Cardiovascular:     Rate and Rhythm: Normal rate and regular rhythm.     Heart sounds: No murmur heard. Pulmonary:     Effort: Pulmonary effort is normal. No respiratory distress.     Breath sounds: Normal breath sounds. No wheezing or rales.  Musculoskeletal:     Cervical back: Normal range of motion and neck supple.  Skin:    General: Skin is warm and dry.      Capillary Refill: Capillary refill takes less than 2 seconds.  Neurological:     General: No focal deficit present.     Mental Status: She is alert and oriented to person, place, and time. Mental status is at baseline.  Psychiatric:        Mood and Affect: Mood normal.        Behavior: Behavior normal.        Thought Content: Thought content normal.        Judgment: Judgment normal.     Results for orders placed or performed in visit on 01/07/23  Microscopic Examination   Collection Time: 01/07/23  3:22 PM   Urine  Result Value Ref Range   WBC, UA 6-10 (A) 0 - 5 /hpf   RBC, Urine 11-30 (A) 0 - 2 /hpf   Epithelial Cells (non renal) 0-10 0 - 10 /hpf   Mucus, UA Present (A) Not Estab.   Bacteria, UA None seen None seen/Few  Urinalysis, Routine w reflex microscopic   Collection Time: 01/07/23  3:22 PM  Result Value Ref Range   Specific Gravity, UA 1.025 1.005 - 1.030   pH, UA 6.0 5.0 - 7.5   Color, UA Yellow Yellow   Appearance Ur Cloudy (A) Clear   Leukocytes,UA 1+ (A) Negative   Protein,UA 2+ (A) Negative/Trace   Glucose, UA Negative Negative   Ketones, UA Negative Negative   RBC, UA 3+ (A) Negative   Bilirubin, UA Negative Negative   Urobilinogen, Ur 0.2 0.2 - 1.0 mg/dL   Nitrite, UA Negative Negative   Microscopic Examination See below:   Urine Culture   Collection Time: 01/07/23  3:26 PM   Specimen: Urine   UR  Result Value Ref Range   Urine Culture, Routine Final report (A)    Organism ID, Bacteria Escherichia coli (A)    ORGANISM ID, BACTERIA Comment    Antimicrobial Susceptibility Comment       Assessment & Plan:   Problem List Items Addressed This Visit   None Visit Diagnoses       Acute non-recurrent frontal sinusitis    -  Primary   Will treat with amoxicillin.  Complete course of antibiotics.  Recommend resting and staying well hydrated.  Follow up if not improved.   Relevant Medications   amoxicillin (AMOXIL) 500 MG capsule        Follow up  plan: Return in about 2 weeks (around 05/13/2023) for Medication follow up.

## 2023-05-05 ENCOUNTER — Ambulatory Visit: Payer: Self-pay | Admitting: *Deleted

## 2023-05-05 ENCOUNTER — Telehealth: Payer: Self-pay

## 2023-05-05 NOTE — Telephone Encounter (Signed)
 Patient still has at least 4 more days of antibiotics.  It is likely that if the antibiotics are not helping her symptoms are viral and need to run their course. Recommend she complete the course and if she is still having symptoms she should come back in to the office.

## 2023-05-05 NOTE — Telephone Encounter (Signed)
 Tried calling patient. No answer and no VM. Will try to call again.   OK for E2C2 to speak to patient if she calls back. Please find out if she is still experiencing symptoms and why she is requesting another antibiotic.

## 2023-05-05 NOTE — Telephone Encounter (Signed)
 Copied from CRM [id number]. Topic: Clinical - Prescription Issue >> May 05, 2023  8:36 AM Albertha Alosa wrote: Reason for CRM: Patient called in stating she needs something stronger than amoxicillin (AMOXIL) 500 MG capsule for her ear and sinus infection, would like for to see if someone could call something in stronger for her at [id number] - [place], [place] - [address] AT [facility] OF [address] [place] [address] [place] [address] Phone: [phone] Fax: [phone]

## 2023-05-05 NOTE — Telephone Encounter (Signed)
 Tried calling patient, no answer and no VM. Will try to call again tomorrow.   OK for E2C2 to advise patient of Promise's message if she calls back.

## 2023-05-05 NOTE — Telephone Encounter (Signed)
  Chief Complaint: worsening cough yellow sputum, left ear pain worsening loss of hearing, requesting medication  Symptoms: left ear pain losing hearing. "Pops". No fever reported but gets "hot then cold". Sinus pain, hoarseness. Reports trying multiple care advise and nothing helping . Last OV 04/29/23 started amoxicillin 500 mg  Frequency: since 04/29/23 Pertinent Negatives: Patient denies chest pain no difficulty breathing no dizziness Disposition: [] ED /[] Urgent Care (no appt availability in office) / [] Appointment(In office/virtual)/ []  [place] [facility]/ [] Home Care/ [] Refused Recommended Disposition /[] [place] [facility]/ [x]  Follow-up with PCP Additional Notes:   Please advise if another OV needed.       Copied from CRM [id number]. Topic: Clinical - Red Word Triage >> May 05, 2023  1:29 PM Elle L wrote: Red Word that prompted transfer to Nurse Triage: The patient has a sinus infection and an ear infection. She was seen on 4/8 but is feeling worse. She has a cough and discolored mucous. Reason for Disposition  Earache  (Exceptions: brief ear pain of < 60 minutes duration, earache occurring during air travel  Answer Assessment - Initial Assessment Questions 1. LOCATION: "Which ear is involved?"     Left ear losing hearing, "pops" 2. ONSET: "When did the ear start hurting"      Continued pain since 04/29/23 3. SEVERITY: "How bad is the pain?"  (Scale 1-10; mild, moderate or severe)   - MILD (1-3): doesn't interfere with normal activities    - MODERATE (4-7): interferes with normal activities or awakens from sleep    - SEVERE (8-10): excruciating pain, unable to do any normal activities      Worsening pain  4. URI SYMPTOMS: "Do you have a runny nose or cough?"     Cough yellow colored sputum 5. FEVER: "Do you have a fever?" If Yes, ask: "What is your temperature, how was it measured, and when did it start?"     Na reports feels "hot then cold" 6. CAUSE: "Have you been swimming  recently?", "How often do you use Q-TIPS?", "Have you had any recent air travel or scuba diving?"     na 7. OTHER SYMPTOMS: "Do you have any other symptoms?" (e.g., headache, stiff neck, dizziness, vomiting, runny nose, decreased hearing)     Sinus pain, left ear pain loss of hearing hoarse voice 8. PREGNANCY: "Is there any chance you are pregnant?" "When was your last menstrual period?"     na  Protocols used: Louie Rover

## 2023-05-05 NOTE — Telephone Encounter (Signed)
 Routing to provider who saw the patient. Requesting to have another antibiotic sent in for continued symptoms.

## 2023-05-06 ENCOUNTER — Ambulatory Visit (INDEPENDENT_AMBULATORY_CARE_PROVIDER_SITE_OTHER): Payer: Medicare (Managed Care) | Admitting: Nurse Practitioner

## 2023-05-06 ENCOUNTER — Encounter: Payer: Self-pay | Admitting: Nurse Practitioner

## 2023-05-06 VITALS — BP 138/82 | HR 91 | Temp 98.4°F | Ht 63.0 in | Wt 190.2 lb

## 2023-05-06 DIAGNOSIS — I10 Essential (primary) hypertension: Secondary | ICD-10-CM

## 2023-05-06 DIAGNOSIS — E782 Mixed hyperlipidemia: Secondary | ICD-10-CM

## 2023-05-06 DIAGNOSIS — J0100 Acute maxillary sinusitis, unspecified: Secondary | ICD-10-CM | POA: Diagnosis not present

## 2023-05-06 DIAGNOSIS — J320 Chronic maxillary sinusitis: Secondary | ICD-10-CM | POA: Insufficient documentation

## 2023-05-06 MED ORDER — PREDNISONE 20 MG PO TABS: 40.0000 mg | ORAL_TABLET | Freq: Every day | ORAL | 0 refills | Status: AC

## 2023-05-06 MED ORDER — DOXYCYCLINE HYCLATE 100 MG PO TABS
100.0000 mg | ORAL_TABLET | Freq: Two times a day (BID) | ORAL | 0 refills | Status: AC
Start: 2023-05-06 — End: 2023-05-13

## 2023-05-06 NOTE — Patient Instructions (Signed)
 Hypertension, Adult High blood pressure (hypertension) is when the force of blood pumping through the arteries is too strong. The arteries are the blood vessels that carry blood from the heart throughout the body. Hypertension forces the heart to work harder to pump blood and may cause arteries to become narrow or stiff. Untreated or uncontrolled hypertension can lead to a heart attack, heart failure, a stroke, kidney disease, and other problems. A blood pressure reading consists of a higher number over a lower number. Ideally, your blood pressure should be below 120/80. The first ("top") number is called the systolic pressure. It is a measure of the pressure in your arteries as your heart beats. The second ("bottom") number is called the diastolic pressure. It is a measure of the pressure in your arteries as the heart relaxes. What are the causes? The exact cause of this condition is not known. There are some conditions that result in high blood pressure. What increases the risk? Certain factors may make you more likely to develop high blood pressure. Some of these risk factors are under your control, including: Smoking. Not getting enough exercise or physical activity. Being overweight. Having too much fat, sugar, calories, or salt (sodium) in your diet. Drinking too much alcohol. Other risk factors include: Having a personal history of heart disease, diabetes, high cholesterol, or kidney disease. Stress. Having a family history of high blood pressure and high cholesterol. Having obstructive sleep apnea. Age. The risk increases with age. What are the signs or symptoms? High blood pressure may not cause symptoms. Very high blood pressure (hypertensive crisis) may cause: Headache. Fast or irregular heartbeats (palpitations). Shortness of breath. Nosebleed. Nausea and vomiting. Vision changes. Severe chest pain, dizziness, and seizures. How is this diagnosed? This condition is diagnosed by  measuring your blood pressure while you are seated, with your arm resting on a flat surface, your legs uncrossed, and your feet flat on the floor. The cuff of the blood pressure monitor will be placed directly against the skin of your upper arm at the level of your heart. Blood pressure should be measured at least twice using the same arm. Certain conditions can cause a difference in blood pressure between your right and left arms. If you have a high blood pressure reading during one visit or you have normal blood pressure with other risk factors, you may be asked to: Return on a different day to have your blood pressure checked again. Monitor your blood pressure at home for 1 week or longer. If you are diagnosed with hypertension, you may have other blood or imaging tests to help your health care provider understand your overall risk for other conditions. How is this treated? This condition is treated by making healthy lifestyle changes, such as eating healthy foods, exercising more, and reducing your alcohol intake. You may be referred for counseling on a healthy diet and physical activity. Your health care provider may prescribe medicine if lifestyle changes are not enough to get your blood pressure under control and if: Your systolic blood pressure is above 130. Your diastolic blood pressure is above 80. Your personal target blood pressure may vary depending on your medical conditions, your age, and other factors. Follow these instructions at home: Eating and drinking  Eat a diet that is high in fiber and potassium, and low in sodium, added sugar, and fat. An example of this eating plan is called the DASH diet. DASH stands for Dietary Approaches to Stop Hypertension. To eat this way: Eat  plenty of fresh fruits and vegetables. Try to fill one half of your plate at each meal with fruits and vegetables. Eat whole grains, such as whole-wheat pasta, brown rice, or whole-grain bread. Fill about one  fourth of your plate with whole grains. Eat or drink low-fat dairy products, such as skim milk or low-fat yogurt. Avoid fatty cuts of meat, processed or cured meats, and poultry with skin. Fill about one fourth of your plate with lean proteins, such as fish, chicken without skin, beans, eggs, or tofu. Avoid pre-made and processed foods. These tend to be higher in sodium, added sugar, and fat. Reduce your daily sodium intake. Many people with hypertension should eat less than 1,500 mg of sodium a day. Do not drink alcohol if: Your health care provider tells you not to drink. You are pregnant, may be pregnant, or are planning to become pregnant. If you drink alcohol: Limit how much you have to: 0-1 drink a day for women. 0-2 drinks a day for men. Know how much alcohol is in your drink. In the [place] one drink equals one 12 oz bottle of beer (355 mL), one 5 oz glass of wine (148 mL), or one 1 oz glass of hard liquor (44 mL). Lifestyle  Work with your health care provider to maintain a healthy body weight or to lose weight. Ask what an ideal weight is for you. Get at least 30 minutes of exercise that causes your heart to beat faster (aerobic exercise) most days of the week. Activities may include walking, swimming, or biking. Include exercise to strengthen your muscles (resistance exercise), such as Pilates or lifting weights, as part of your weekly exercise routine. Try to do these types of exercises for 30 minutes at least 3 days a week. Do not use any products that contain nicotine or tobacco. These products include cigarettes, chewing tobacco, and vaping devices, such as e-cigarettes. If you need help quitting, ask your health care provider. Monitor your blood pressure at home as told by your health care provider. Keep all follow-up visits. This is important. Medicines Take over-the-counter and prescription medicines only as told by your health care provider. Follow directions carefully. Blood  pressure medicines must be taken as prescribed. Do not skip doses of blood pressure medicine. Doing this puts you at risk for problems and can make the medicine less effective. Ask your health care provider about side effects or reactions to medicines that you should watch for. Contact a health care provider if you: Think you are having a reaction to a medicine you are taking. Have headaches that keep coming back (recurring). Feel dizzy. Have swelling in your ankles. Have trouble with your vision. Get help right away if you: Develop a severe headache or confusion. Have unusual weakness or numbness. Feel faint. Have severe pain in your chest or abdomen. Vomit repeatedly. Have trouble breathing. These symptoms may be an emergency. Get help right away. Call 911. Do not wait to see if the symptoms will go away. Do not drive yourself to the hospital. Summary Hypertension is when the force of blood pumping through your arteries is too strong. If this condition is not controlled, it may put you at risk for serious complications. Your personal target blood pressure may vary depending on your medical conditions, your age, and other factors. For most people, a normal blood pressure is less than 120/80. Hypertension is treated with lifestyle changes, medicines, or a combination of both. Lifestyle changes include losing weight, eating a healthy,  low-sodium diet, exercising more, and limiting alcohol. This information is not intended to replace advice given to you by your health care provider. Make sure you discuss any questions you have with your health care provider. Document Revised: 11/14/2020 Document Reviewed: 11/14/2020 Elsevier Patient Education  2024 [employer].

## 2023-05-06 NOTE — Assessment & Plan Note (Signed)
 Chronic, ongoing.  BP trending on average <130/80 at home, but occasional elevations present.  Initially elevated today, improving on recheck.  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  Is not taking Metoprolol or Hydralazine as ordered because of side effects she read about online, we have discussed many times side effect profiles and she was tolerating these.  DASH diet at home.  Continue current medication regimen: only taking Lisinopril, but recommend she restart Hydralazine which was offering benefit.  LABS: Obtain next visit.

## 2023-05-06 NOTE — Progress Notes (Signed)
 BP 138/82 (BP Location: Left Arm, Patient Position: Sitting, Cuff Size: Normal)   Pulse 91   Temp 98.4 F (36.9 C) (Oral)   Ht 5\' 3"  (1.6 m)   Wt 190 lb 3.2 oz (86.3 kg)   SpO2 98%   BMI 33.69 kg/m    Subjective:    Patient ID: Beth Parker, female    DOB: Jul 19, 1955, 68 y.o.   MRN: [id number]  HPI: Beth Parker is a 68 y.o. female  Chief Complaint  Patient presents with   URI    Patient states she is still having congestion, cough and ear pain since visit last week. Prescribed Amoxicillin last week to take BID for 10 days. Patient states she has been taking an extra tablet of this daily because it was not helping her symptoms. States she is out of the prescribed antibiotic last week.    UPPER RESPIRATORY TRACT INFECTION Was seen on 04/29/23 for current symptoms and started on Amoxicillin 500 MG BID.  She reports this has not been helping and symptoms continue to be present.  Has completed Amoxicillin because she increased to taking TID after checking on internet.   Fever: no Cough: yes Shortness of breath: no Wheezing: no Chest pain: no Chest tightness: yes Chest congestion: yes Nasal congestion: yes Runny nose: yes Post nasal drip: yes Sneezing: no Sore throat: yes Swollen glands: no Sinus pressure: yes Headache: yes Face pain: no Toothache: no Ear pain: yes bilateral Ear pressure: yes bilateral -- feels them popping Eyes red/itching:no Eye drainage/crusting: no  Vomiting: no Rash: no Fatigue: yes Sick contacts: no Strep contacts: no  Context: fluctuating Recurrent sinusitis: no Relief with OTC cold/cough medications: no  Treatments attempted: Antibiotics - Vicks Vapor rub and cough drops    HYPERTENSION without Chronic Kidney Disease Only taking Lisinopril at present. Self stopped Metoprolol and Hydralazine, did not want to take these due to side effects she read about. Also not taking Lipitor due to information online, refuses to restart. Hypertension  status: improving at home  Satisfied with current treatment? yes Duration of hypertension: chronic BP monitoring frequency:  daily BP range: 118/61 to 189/93, on average staying <130/80 BP medication side effects:  no Medication compliance: poor compliance Aspirin: no  Recurrent headaches: no Visual changes: no Palpitations: no Dyspnea: no Chest pain: no Lower extremity edema: no Dizzy/lightheaded: no   Relevant past medical, surgical, family and social history reviewed and updated as indicated. Interim medical history since our last visit reviewed. Allergies and medications reviewed and updated.  Review of Systems  Constitutional:  Positive for fatigue. Negative for activity change, appetite change, chills and fever.  HENT:  Positive for congestion, postnasal drip, rhinorrhea, sinus pressure, sinus pain and sore throat. Negative for ear discharge, ear pain, facial swelling, sneezing and voice change.   Eyes:  Negative for pain and visual disturbance.  Respiratory:  Positive for cough and chest tightness. Negative for shortness of breath and wheezing.   Cardiovascular:  Negative for chest pain, palpitations and leg swelling.  Gastrointestinal: Negative.   Neurological:  Positive for headaches. Negative for dizziness and numbness.  Psychiatric/Behavioral: Negative.      Per HPI unless specifically indicated above     Objective:    BP 138/82 (BP Location: Left Arm, Patient Position: Sitting, Cuff Size: Normal)   Pulse 91   Temp 98.4 F (36.9 C) (Oral)   Ht 5\' 3"  (1.6 m)   Wt 190 lb 3.2 oz (86.3 kg)   SpO2  98%   BMI 33.69 kg/m   Wt Readings from Last 3 Encounters:  05/06/23 190 lb 3.2 oz (86.3 kg)  04/29/23 187 lb 6.4 oz (85 kg)  04/07/23 193 lb 11.2 oz (87.9 kg)    Physical Exam Vitals and nursing note reviewed.  Constitutional:      General: She is awake. She is not in acute distress.    Appearance: She is well-developed and well-groomed. She is obese. She is  ill-appearing. She is not toxic-appearing.  HENT:     Head: Normocephalic.     Right Ear: Hearing, ear canal and external ear normal. A middle ear effusion is present. Tympanic membrane is not injected or perforated.     Left Ear: Hearing, ear canal and external ear normal. A middle ear effusion is present. Tympanic membrane is not injected or perforated.     Ears:     Comments: Moderate cerumen noted to left ear and scant to right.  Able to view TM both ears.    Nose: Rhinorrhea present. Rhinorrhea is clear.     Right Sinus: Maxillary sinus tenderness and frontal sinus tenderness present.     Left Sinus: Maxillary sinus tenderness and frontal sinus tenderness present.     Mouth/Throat:     Mouth: Mucous membranes are moist.     Pharynx: Posterior oropharyngeal erythema (mild) present. No pharyngeal swelling or oropharyngeal exudate.  Eyes:     General: Lids are normal.        Right eye: No discharge.        Left eye: No discharge.     Conjunctiva/sclera: Conjunctivae normal.     Pupils: Pupils are equal, round, and reactive to light.  Neck:     Thyroid: No thyromegaly.     Vascular: No carotid bruit.  Cardiovascular:     Rate and Rhythm: Normal rate and regular rhythm.     Heart sounds: Normal heart sounds. No murmur heard.    No gallop.  Pulmonary:     Effort: Pulmonary effort is normal. No accessory muscle usage or respiratory distress.     Breath sounds: No decreased breath sounds, wheezing or rhonchi.  Abdominal:     General: Bowel sounds are normal.     Palpations: Abdomen is soft. There is no hepatomegaly or splenomegaly.  Musculoskeletal:     Cervical back: Normal range of motion and neck supple.     Right lower leg: No edema.     Left lower leg: No edema.  Lymphadenopathy:     Head:     Right side of head: Submandibular and tonsillar adenopathy present. No submental, preauricular or posterior auricular adenopathy.     Left side of head: Submandibular and tonsillar  adenopathy present. No submental, preauricular or posterior auricular adenopathy.     Cervical: No cervical adenopathy.  Skin:    General: Skin is warm and dry.  Neurological:     Mental Status: She is alert and oriented to person, place, and time.  Psychiatric:        Attention and Perception: Attention normal.        Mood and Affect: Mood normal.        Speech: Speech normal.        Behavior: Behavior normal. Behavior is cooperative.        Thought Content: Thought content normal.     Results for orders placed or performed in visit on 01/07/23  Microscopic Examination   Collection Time: 01/07/23  3:22 PM  Urine  Result Value Ref Range   WBC, UA 6-10 (A) 0 - 5 /hpf   RBC, Urine 11-30 (A) 0 - 2 /hpf   Epithelial Cells (non renal) 0-10 0 - 10 /hpf   Mucus, UA Present (A) Not Estab.   Bacteria, UA None seen None seen/Few  Urinalysis, Routine w reflex microscopic   Collection Time: 01/07/23  3:22 PM  Result Value Ref Range   Specific Gravity, UA 1.025 1.005 - 1.030   pH, UA 6.0 5.0 - 7.5   Color, UA Yellow Yellow   Appearance Ur Cloudy (A) Clear   Leukocytes,UA 1+ (A) Negative   Protein,UA 2+ (A) Negative/Trace   Glucose, UA Negative Negative   Ketones, UA Negative Negative   RBC, UA 3+ (A) Negative   Bilirubin, UA Negative Negative   Urobilinogen, Ur 0.2 0.2 - 1.0 mg/dL   Nitrite, UA Negative Negative   Microscopic Examination See below:   Urine Culture   Collection Time: 01/07/23  3:26 PM   Specimen: Urine   UR  Result Value Ref Range   Urine Culture, Routine Final report (A)    Organism ID, Bacteria Escherichia coli (A)    ORGANISM ID, BACTERIA Comment    Antimicrobial Susceptibility Comment       Assessment & Plan:   Problem List Items Addressed This Visit       Cardiovascular and Mediastinum   Essential hypertension   Chronic, ongoing.  BP trending on average <130/80 at home, but occasional elevations present.  Initially elevated today, improving on  recheck.  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  Is not taking Metoprolol or Hydralazine as ordered because of side effects she read about online, we have discussed many times side effect profiles and she was tolerating these.  DASH diet at home.  Continue current medication regimen: only taking Lisinopril, but recommend she restart Hydralazine which was offering benefit.  LABS: Obtain next visit.        Respiratory   Maxillary sinusitis - Primary   Ongoing symptoms present with no benefit from Amoxicillin.  Will change to Doxycycline 100 MG BID and add on Prednisone 40 MG daily for 5 days with this.  She reports tolerance of these in past.  Recommend: - Increased rest - Increasing Fluids - Acetaminophen needed for fever/pain.  - Salt water gargling, chloraseptic spray and throat lozenges - Mucinex.  - Humidifying the air.       Relevant Medications   doxycycline (VIBRA-TABS) 100 MG tablet   predniSONE (DELTASONE) 20 MG tablet     Other   Mixed hyperlipidemia   Chronic, ongoing.  Not taking medication as instructed due to concern about side effects she read online, have highly recommended she restart due to risk for stroke or CVA.  Discussed side effects at length with her.  Refuses to take medication.        Follow up plan: Return if symptoms worsen or fail to improve.

## 2023-05-06 NOTE — Assessment & Plan Note (Signed)
 Ongoing symptoms present with no benefit from Amoxicillin.  Will change to Doxycycline 100 MG BID and add on Prednisone 40 MG daily for 5 days with this.  She reports tolerance of these in past.  Recommend: - Increased rest - Increasing Fluids - Acetaminophen needed for fever/pain.  - Salt water gargling, chloraseptic spray and throat lozenges - Mucinex.  - Humidifying the air.

## 2023-05-06 NOTE — Assessment & Plan Note (Signed)
 Chronic, ongoing.  Not taking medication as instructed due to concern about side effects she read online, have highly recommended she restart due to risk for stroke or CVA.  Discussed side effects at length with her.  Refuses to take medication.

## 2023-05-07 NOTE — Telephone Encounter (Signed)
 Patient seen for an appointment 05/06/23, after this call.

## 2023-05-16 ENCOUNTER — Ambulatory Visit: Payer: Medicare (Managed Care) | Admitting: Nurse Practitioner

## 2023-05-17 NOTE — Patient Instructions (Signed)

## 2023-05-19 ENCOUNTER — Encounter: Payer: Self-pay | Admitting: Nurse Practitioner

## 2023-05-19 ENCOUNTER — Ambulatory Visit (INDEPENDENT_AMBULATORY_CARE_PROVIDER_SITE_OTHER): Payer: Medicare (Managed Care) | Admitting: Nurse Practitioner

## 2023-05-19 VITALS — BP 128/68 | HR 96 | Temp 98.3°F | Ht 63.0 in | Wt 186.8 lb

## 2023-05-19 DIAGNOSIS — J0100 Acute maxillary sinusitis, unspecified: Secondary | ICD-10-CM | POA: Diagnosis not present

## 2023-05-19 MED ORDER — LORATADINE 10 MG PO TABS: 10.0000 mg | ORAL_TABLET | Freq: Every day | ORAL | 11 refills | Status: DC

## 2023-05-19 MED ORDER — MAGNESIUM 400 MG PO CAPS: 1.0000 | ORAL_CAPSULE | Freq: Every day | ORAL | 1 refills | Status: DC

## 2023-05-19 NOTE — Assessment & Plan Note (Addendum)
 Continues with ongoing symptoms. No relief from Amoxicillin , Doxycycline , or Prednisone . Recommend taking Claritin 10 mg daily, educated her on this and possible allergies which are causing lingering symptoms. If no relief of symptoms, will refer to ENT for further assessment.

## 2023-05-19 NOTE — Progress Notes (Signed)
 BP 128/68   Pulse 96   Temp 98.3 F (36.8 C) (Oral)   Ht 5\' 3"  (1.6 m)   Wt 186 lb 12.8 oz (84.7 kg)   SpO2 98%   BMI 33.09 kg/m    Subjective:    Patient ID: Beth Parker, female    DOB: 1955/07/28, 68 y.o.   MRN: [id number]  HPI: Beth Parker is a 68 y.o. female complaints of ear and sinus infection. Used OTC Debox to remove wax from ears. Still feels as if left ear is impacted.  Chief Complaint  Patient presents with   Sinusitis   NOTE WRITTEN BY DNP STUDENT.  ASSESSMENT AND PLAN OF CARE REVIEWED WITH STUDENT, AGREE WITH ABOVE FINDINGS AND PLAN.   SINUSITIS: Ongoing issues with sinuses.  Was treated on 04/29/23 with Amoxicillin , which she reported offered no benefit.  Seen again on 05/06/23 and treated with Doxycycline  and Prednisone .  Presents today for continued sinus issues. Fever: no Cough:  occasional Shortness of breath: no Wheezing: no Chest pain: yes, with cough Chest tightness:  occasional Chest congestion: no Nasal congestion: no Runny nose: yes Post nasal drip: yes Sneezing: no Sore throat: no Swollen glands: no Sinus pressure: yes Headache: yes Face pain: no Toothache: no Ear pain: yes left Ear pressure: yes left Eyes red/itching:no Eye drainage/crusting: no  Vomiting: no Rash: no Fatigue: no Sick contacts: no Strep contacts: no  Context: fluctuating Recurrent sinusitis: yes Relief with OTC cold/cough medications: no  Treatments attempted: antibiotics    Relevant past medical, surgical, family and social history reviewed and updated as indicated. Interim medical history since our last visit reviewed. Allergies and medications reviewed and updated.  Review of Systems  Constitutional:  Negative for chills, fatigue and fever.  HENT:  Positive for congestion, ear pain, postnasal drip, sinus pressure and sinus pain. Negative for ear discharge, facial swelling, rhinorrhea, sneezing and sore throat.        Left ear feels swollen.  Eyes:   Negative for discharge and redness.  Respiratory:  Negative for chest tightness, shortness of breath and wheezing.   Cardiovascular:  Negative for chest pain, palpitations and leg swelling.  Gastrointestinal: Negative.   Endocrine: Negative.   Genitourinary: Negative.   Musculoskeletal: Negative.   Skin: Negative.   Allergic/Immunologic: Negative.   Neurological:  Negative for dizziness and headaches.  Hematological: Negative.   Psychiatric/Behavioral:  The patient is not nervous/anxious.     Per HPI unless specifically indicated above     Objective:    BP 128/68   Pulse 96   Temp 98.3 F (36.8 C) (Oral)   Ht 5\' 3"  (1.6 m)   Wt 186 lb 12.8 oz (84.7 kg)   SpO2 98%   BMI 33.09 kg/m   Wt Readings from Last 3 Encounters:  05/19/23 186 lb 12.8 oz (84.7 kg)  05/06/23 190 lb 3.2 oz (86.3 kg)  04/29/23 187 lb 6.4 oz (85 kg)    Physical Exam Vitals and nursing note reviewed.  Constitutional:      General: She is not in acute distress.    Appearance: Normal appearance. She is well-developed. She is obese. She is not ill-appearing.  HENT:     Right Ear: Tympanic membrane, ear canal and external ear normal. No decreased hearing noted. There is no impacted cerumen.     Left Ear: Tympanic membrane, ear canal and external ear normal. Decreased hearing noted. There is impacted cerumen.     Nose: Nose normal. No congestion  or rhinorrhea.     Mouth/Throat:     Mouth: Mucous membranes are moist.     Pharynx: Oropharynx is clear. No oropharyngeal exudate or posterior oropharyngeal erythema.  Neck:     Thyroid: No thyroid mass.     Vascular: No carotid bruit.  Cardiovascular:     Rate and Rhythm: Normal rate and regular rhythm.     Heart sounds: Normal heart sounds. No murmur heard. Pulmonary:     Effort: Pulmonary effort is normal. No respiratory distress.     Breath sounds: Normal breath sounds and air entry. No wheezing.  Abdominal:     General: Bowel sounds are normal.      Palpations: Abdomen is soft.  Musculoskeletal:        General: Normal range of motion.     Cervical back: Normal range of motion and neck supple.  Lymphadenopathy:     Cervical: No cervical adenopathy.     Right cervical: No superficial cervical adenopathy.    Left cervical: No superficial cervical adenopathy.  Skin:    General: Skin is warm and dry.  Neurological:     General: No focal deficit present.     Mental Status: She is alert and oriented to person, place, and time. Mental status is at baseline.     Deep Tendon Reflexes: Reflexes are normal and symmetric.  Psychiatric:        Attention and Perception: Attention and perception normal.        Mood and Affect: Mood and affect normal.        Speech: Speech normal.        Behavior: Behavior normal. Behavior is cooperative.        Thought Content: Thought content normal.        Cognition and Memory: Cognition and memory normal.        Judgment: Judgment normal.    Results for orders placed or performed in visit on 01/07/23  Microscopic Examination   Collection Time: 01/07/23  3:22 PM   Urine  Result Value Ref Range   WBC, UA 6-10 (A) 0 - 5 /hpf   RBC, Urine 11-30 (A) 0 - 2 /hpf   Epithelial Cells (non renal) 0-10 0 - 10 /hpf   Mucus, UA Present (A) Not Estab.   Bacteria, UA None seen None seen/Few  Urinalysis, Routine w reflex microscopic   Collection Time: 01/07/23  3:22 PM  Result Value Ref Range   Specific Gravity, UA 1.025 1.005 - 1.030   pH, UA 6.0 5.0 - 7.5   Color, UA Yellow Yellow   Appearance Ur Cloudy (A) Clear   Leukocytes,UA 1+ (A) Negative   Protein,UA 2+ (A) Negative/Trace   Glucose, UA Negative Negative   Ketones, UA Negative Negative   RBC, UA 3+ (A) Negative   Bilirubin, UA Negative Negative   Urobilinogen, Ur 0.2 0.2 - 1.0 mg/dL   Nitrite, UA Negative Negative   Microscopic Examination See below:   Urine Culture   Collection Time: 01/07/23  3:26 PM   Specimen: Urine   UR  Result Value Ref  Range   Urine Culture, Routine Final report (A)    Organism ID, Bacteria Escherichia coli (A)    ORGANISM ID, BACTERIA Comment    Antimicrobial Susceptibility Comment       Assessment & Plan:   Problem List Items Addressed This Visit       Respiratory   Maxillary sinusitis - Primary   Continues with ongoing symptoms. No  relief from Amoxicillin , Doxycycline , or Prednisone . Recommend taking Claritin 10 mg daily, educated her on this and possible allergies which are causing lingering symptoms. If no relief of symptoms, will refer to ENT for further assessment.       Relevant Medications   loratadine (CLARITIN) 10 MG tablet     Follow up plan: Return for as scheduled.

## 2023-05-19 NOTE — Progress Notes (Deleted)
   There were no vitals taken for this visit.   Subjective:    Patient ID: Beth Parker, female    DOB: Aug 03, 1955, 68 y.o.   MRN: [id number]  HPI: Beth Parker is a 68 y.o. female  Chief Complaint  Patient presents with   Sinusitis   SINUSITIS: Ongoing issues with sinuses.  Was treated on 04/29/23 with Amoxicillin , which she reported offered no benefit.  Seen again on 05/06/23 and treated with Doxycycline  and Prednisone .  Presents today for continued sinus issues. Fever: {Blank single:19197::"yes","no"} Cough: {Blank single:19197::"yes","no"} Shortness of breath: {Blank single:19197::"yes","no"} Wheezing: {Blank single:19197::"yes","no"} Chest pain: {Blank single:19197::"yes","no","yes, with cough"} Chest tightness: {Blank single:19197::"yes","no"} Chest congestion: {Blank single:19197::"yes","no"} Nasal congestion: {Blank single:19197::"yes","no"} Runny nose: {Blank single:19197::"yes","no"} Post nasal drip: {Blank single:19197::"yes","no"} Sneezing: {Blank single:19197::"yes","no"} Sore throat: {Blank single:19197::"yes","no"} Swollen glands: {Blank single:19197::"yes","no"} Sinus pressure: {Blank single:19197::"yes","no"} Headache: {Blank single:19197::"yes","no"} Face pain: {Blank single:19197::"yes","no"} Toothache: {Blank single:19197::"yes","no"} Ear pain: {Blank single:19197::"yes","no"} {Blank single:19197::""right","left", "bilateral"} Ear pressure: {Blank single:19197::"yes","no"} {Blank single:19197::""right","left", "bilateral"} Eyes red/itching:{Blank single:19197::"yes","no"} Eye drainage/crusting: {Blank single:19197::"yes","no"}  Vomiting: {Blank single:19197::"yes","no"} Rash: {Blank single:19197::"yes","no"} Fatigue: {Blank single:19197::"yes","no"} Sick contacts: {Blank single:19197::"yes","no"} Strep contacts: {Blank single:19197::"yes","no"}  Context: {Blank multiple:19196::"better","worse","stable","fluctuating"} Recurrent sinusitis: {Blank  single:19197::"yes","no"} Relief with OTC cold/cough medications: {Blank single:19197::"yes","no"}  Treatments attempted: {Blank multiple:19196::"none","cold/sinus","mucinex ","anti-histamine","pseudoephedrine","cough syrup","antibiotics"}    Relevant past medical, surgical, family and social history reviewed and updated as indicated. Interim medical history since our last visit reviewed. Allergies and medications reviewed and updated.  Review of Systems  Per HPI unless specifically indicated above     Objective:    There were no vitals taken for this visit.  Wt Readings from Last 3 Encounters:  05/06/23 190 lb 3.2 oz (86.3 kg)  04/29/23 187 lb 6.4 oz (85 kg)  04/07/23 193 lb 11.2 oz (87.9 kg)    Physical Exam  Results for orders placed or performed in visit on 01/07/23  Microscopic Examination   Collection Time: 01/07/23  3:22 PM   Urine  Result Value Ref Range   WBC, UA 6-10 (A) 0 - 5 /hpf   RBC, Urine 11-30 (A) 0 - 2 /hpf   Epithelial Cells (non renal) 0-10 0 - 10 /hpf   Mucus, UA Present (A) Not Estab.   Bacteria, UA None seen None seen/Few  Urinalysis, Routine w reflex microscopic   Collection Time: 01/07/23  3:22 PM  Result Value Ref Range   Specific Gravity, UA 1.025 1.005 - 1.030   pH, UA 6.0 5.0 - 7.5   Color, UA Yellow Yellow   Appearance Ur Cloudy (A) Clear   Leukocytes,UA 1+ (A) Negative   Protein,UA 2+ (A) Negative/Trace   Glucose, UA Negative Negative   Ketones, UA Negative Negative   RBC, UA 3+ (A) Negative   Bilirubin, UA Negative Negative   Urobilinogen, Ur 0.2 0.2 - 1.0 mg/dL   Nitrite, UA Negative Negative   Microscopic Examination See below:   Urine Culture   Collection Time: 01/07/23  3:26 PM   Specimen: Urine   UR  Result Value Ref Range   Urine Culture, Routine Final report (A)    Organism ID, Bacteria Escherichia coli (A)    ORGANISM ID, BACTERIA Comment    Antimicrobial Susceptibility Comment       Assessment & Plan:   Problem List  Items Addressed This Visit   None    Follow up plan: No follow-ups on file.

## 2023-05-21 ENCOUNTER — Ambulatory Visit: Payer: Self-pay | Admitting: Nurse Practitioner

## 2023-05-21 NOTE — Telephone Encounter (Signed)
 Chief Complaint: Chest tightness  Symptoms: Coughing, sore throat, difficulty breathing, vomiting x2, not eating, chest tightness constant (8/10), sneezing, "feel hot" Pertinent Negatives: Patient denies diarrhea  Disposition: [x] ED   Additional Notes: Patient was seen in office on 4/28 with Jolene Cannady, NP for sinusitis. Pt states she is getting worse. Pt states she is able to drink water. Pt advised to go to ED and have another adult drive her there. Pt states understanding and is agreeable to plan.   Copied from CRM [id number]. Topic: Clinical - Red Word Triage >> May 21, 2023  4:32 PM Felizardo Hotter wrote: Red Word that prompted transfer to Nurse Triage: Pt called vomiting, can't breath, was seen in office 2 days ago. Reason for Disposition  SEVERE chest pain  Answer Assessment - Initial Assessment Questions 1. RESPIRATORY STATUS: "Describe your breathing?" (e.g., wheezing, shortness of breath, unable to speak, severe coughing)      *No Answer* 2. ONSET: "When did this breathing problem begin?"      *No Answer* 3. PATTERN "Does the difficult breathing come and go, or has it been constant since it started?"      *No Answer* 4. SEVERITY: "How bad is your breathing?" (e.g., mild, moderate, severe)    - MILD: No SOB at rest, mild SOB with walking, speaks normally in sentences, can lie down, no retractions, pulse < 100.    - MODERATE: SOB at rest, SOB with minimal exertion and prefers to sit, cannot lie down flat, speaks in phrases, mild retractions, audible wheezing, pulse 100-120.    - SEVERE: Very SOB at rest, speaks in single words, struggling to breathe, sitting hunched forward, retractions, pulse > 120      Moderate-severe 5. RECURRENT SYMPTOM: "Have you had difficulty breathing before?" If Yes, ask: "When was the last time?" and "What happened that time?"      *No Answer* 6. CARDIAC HISTORY: "Do you have any history of heart disease?" (e.g., heart attack, angina, bypass surgery,  angioplasty)      *No Answer* 7. LUNG HISTORY: "Do you have any history of lung disease?"  (e.g., pulmonary embolus, asthma, emphysema)     *No Answer* 8. CAUSE: "What do you think is causing the breathing problem?"      *No Answer* 9. OTHER SYMPTOMS: "Do you have any other symptoms? (e.g., dizziness, runny nose, cough, chest pain, fever)     *No Answer*  Answer Assessment - Initial Assessment Questions Chief Complaint: Chest tightness  Symptoms: Coughing, sore throat, difficulty breathing, vomiting x2, not eating, chest tightness constant (8/10), sneezing, "feel hot"  Pertinent Negatives: Patient denies diarrhea  Protocols used: Breathing Difficulty-A-AH, Chest Pain-A-AH

## 2023-05-22 ENCOUNTER — Ambulatory Visit: Payer: Self-pay

## 2023-05-22 NOTE — Telephone Encounter (Signed)
 Routing to provider. Does the patient need another appointment? Or is there something we can do for her?

## 2023-05-22 NOTE — Telephone Encounter (Signed)
 Copied from CRM [id number]. Topic: Appointments - Appointment Scheduling >> May 22, 2023  8:13 AM Emylou G wrote: sinus, ear infection, throat, cough, chest.. worsening.Aaron Aas

## 2023-05-22 NOTE — Telephone Encounter (Addendum)
   Pt called in asking for an appointment. Pt stated her sx are worse. Saw that pt was advised to go to ED 05/21/23. Advised pt that since she was advised to go to the ED yesterday and she is worse, advised pt to go to the ED. Pt yelled, "Fine! Ill just die!" Pt disconnected call.   Copied from CRM [id number]. Topic: Appointments - Appointment Scheduling >> May 22, 2023  8:13 AM Beth Parker wrote: sinus, ear infection, throat, cough, chest.. worsening.Aaron Aas

## 2023-05-22 NOTE — Telephone Encounter (Signed)
 This RN attempted to contact patient. Patient was disconnected while speaking with specialist who was transferring her to Nurse Triage. No answer. LVM. Unsure if previous attempt or conversation with other RN.

## 2023-05-22 NOTE — Telephone Encounter (Signed)
 This RN attempted third attempt to patient. No answer. LVM. Patient was earlier encouraged by RN triage to be seen in ED for symptoms. Will route to office for further follow up.

## 2023-05-22 NOTE — Telephone Encounter (Signed)
Tried calling patient, no answer and no VM. Will try to call again later.  

## 2023-05-31 NOTE — Patient Instructions (Incomplete)

## 2023-06-02 ENCOUNTER — Ambulatory Visit: Payer: Medicare (Managed Care) | Admitting: Nurse Practitioner

## 2023-06-11 ENCOUNTER — Ambulatory Visit: Payer: Self-pay

## 2023-06-11 NOTE — Telephone Encounter (Signed)
 Routing to provider to advise. Can patient be worked in on Friday? Possibly with the 1:00 virtual?

## 2023-06-11 NOTE — Telephone Encounter (Signed)
  Chief Complaint: Back/Buttocks pain Symptoms: pain Frequency: ranges up to 9/10 Pertinent Negatives: Patient denies bleeding Disposition: [] ED /[] Urgent Care (no appt availability in office) / [x] Appointment(In office/virtual)/ []  [place] [facility]/ [] Home Care/ [] Refused Recommended Disposition /[] [place] [facility]/ [x]  Follow-up with PCP Additional Notes: Patient called in stating she has severe pain in her back and buttocks from a fall, where she was attempting to sit in a roller chair and the chair went out from under her and her buttocks hit the floor. Patient states this has happened on multiple attempts over the last few weeks. Patient denies any open areas with bleeding but states she is unable to view the area to determine if it is bruised or swollen. Patient states pain is relieved mildly with Tylenol . Patient informed that her PCP doesn't have availability for over a week and patient is added to wait list. Patient states she has to see her PCP for her insurance to cover it. Please advise if any cancellations occur and patient can be seen.   Copied from CRM [id number]. Topic: Clinical - Red Word Triage >> Jun 11, 2023  2:06 PM Turkey B wrote: [facility] that prompted transfer to Nurse Triage: pt states has severe pain on buttocks and back Additional Information  Commented on: MILD weakness (i.e., does not interfere with ability to work, go to school, normal activities)  (Exception: Mild weakness is a chronic symptom.)    Pain  Answer Assessment - Initial Assessment Questions 1. MECHANISM: "How did the fall happen?"     Patient was trying to sit on a rolling chair and it went out from under her on multiple attempts 3. ONSET: "When did the fall happen?" (e.g., minutes, hours, or days ago)     Multiple attempts over several weeks, most recent within last two weeks 4. LOCATION: "What part of the body hit the ground?" (e.g., back, buttocks, head, hips, knees, hands, head,  stomach)     buttock 5. INJURY: "Did you hurt (injure) yourself when you fell?" If Yes, ask: "What did you injure? Tell me more about this?" (e.g., body area; type of injury; pain severity)"     Unsure, patient cannot see that area to determine if there is bruising or swelling 6. PAIN: "Is there any pain?" If Yes, ask: "How bad is the pain?" (e.g., Scale 1-10; or mild,  moderate, severe)   - NONE (0): No pain   - MILD (1-3): Doesn't interfere with normal activities    - MODERATE (4-7): Interferes with normal activities or awakens from sleep    - SEVERE (8-10): Excruciating pain, unable to do any normal activities      9-10 7. SIZE: For cuts, bruises, or swelling, ask: "How large is it?" (e.g., inches or centimeters)      Patient unsure 9. OTHER SYMPTOMS: "Do you have any other symptoms?" (e.g., dizziness, fever, weakness; new onset or worsening).      N/a 10. CAUSE: "What do you think caused the fall (or falling)?" (e.g., tripped, dizzy spell)       Missed the chair because the chair rolls and rolled out from under her  Protocols used: Falls and [facility]

## 2023-06-12 NOTE — Telephone Encounter (Signed)
 Please call and see if the patient can come tomorrow at 1:40 per Jolene.

## 2023-06-18 NOTE — Telephone Encounter (Signed)
 Several attempts to contact patient, number not in service. Will try contacting patient at a later time.

## 2023-06-19 ENCOUNTER — Ambulatory Visit: Payer: Self-pay

## 2023-06-19 NOTE — Telephone Encounter (Signed)
  Chief Complaint: itching/rash   Disposition: [] ED /[] Urgent Care (no appt availability in office) / [x] Appointment(In office/virtual)/ []  [place] [facility]/ [] Home Care/ [] Refused Recommended Disposition /[] [place] [facility]/ []  Follow-up with PCP Additional Notes: Pt called with itching and rash on neck. Pt stated the whelps on neck are smaller than a coin, red, and raised. Pt stated the whelps do not itch but rest of body does. RN gave care advice and pt verbalized understanding. Pt has soonest PCP appt of 6/4 @ 1420.           Copied from CRM [id number]. Topic: Clinical - Red Word Triage >> Jun 19, 2023  4:27 PM Turkey B wrote: [facility] that prompted transfer to Nurse Triage: pt itches all over and neck is breaking out Reason for Disposition  Mild widespread rash  (Exception: Heat rash lasting 3 days or less.)  Answer Assessment - Initial Assessment Questions 1. APPEARANCE of RASH: "Describe the rash." (e.g., spots, blisters, raised areas, skin peeling, scaly)     Red and raised  2. SIZE: "How big are the spots?" (e.g., tip of pen, eraser, coin; inches, centimeters)     Smaller than coin 3. LOCATION: "Where is the rash located?"     On neck 4. COLOR: "What color is the rash?" (Note: It is difficult to assess rash color in people with darker-colored skin. When this situation occurs, simply ask the caller to describe what they see.)     red 5. ONSET: "When did the rash begin?"     1 week  6. FEVER: "Do you have a fever?" If Yes, ask: "What is your temperature, how was it measured, and when did it start?"     Not sure 7. ITCHING: "Does the rash itch?" If Yes, ask: "How bad is the itch?" (Scale 1-10; or mild, moderate, severe)     moderate 8. CAUSE: "What do you think is causing the rash?"     Not sure  9. MEDICINE FACTORS: "Have you started any new medicines within the last 2 weeks?" (e.g., antibiotics)      Not sure  10. OTHER SYMPTOMS: "Do you have any other  symptoms?" (e.g., dizziness, headache, sore throat, joint pain)       Dizzy, ear popping  Protocols used: Rash or Redness - [facility]

## 2023-06-19 NOTE — Telephone Encounter (Signed)
Attempted to contact patient, no answer and no VM.

## 2023-06-19 NOTE — Telephone Encounter (Signed)
 Copied from CRM [id number]. Topic: Clinical - Red Word Triage >> Jun 19, 2023  8:46 AM Elle L wrote: Red Word that prompted transfer to Nurse Triage: The patient is broken out on her neck with a rash and it is itchy and painful.

## 2023-06-19 NOTE — Telephone Encounter (Signed)
Attempted to contact pt, number not in service.

## 2023-06-19 NOTE — Telephone Encounter (Signed)
 Unable to reach patient after 3 attempts by E2C2 NT, routing to the provider for resolution per protocol.  Reason for Disposition . Third attempt to contact caller AND no contact made. Phone number verified.  Protocols used: No Contact or Duplicate Contact Call-A-AH

## 2023-06-21 NOTE — Patient Instructions (Signed)
 Rash, Adult  A rash is a breakout of spots or blotches on the skin. It can change the way your skin looks and feels. Many things can cause a rash. The goal of treatment is to stop the itching and keep the rash from spreading. Follow these instructions at home: Medicine Take or apply over-the-counter and prescription medicines only as told by your doctor. These may include medicines to treat: Red or swollen skin. Itching. An allergy. Pain. An infection.  Skin care Put a cool, wet cloth on the rash. Do not scratch or rub your skin. Try not to cover the rash. Keep it exposed to air as often as you can. Managing itching and discomfort Avoid hot showers or baths. These can make itching worse. A cold shower may help. Try taking a bath with: Epsom salts. You can get these at your pharmacy or grocery store. Follow the instructions on the package. Baking soda. Pour a small amount into the bath as told by your doctor. Colloidal oatmeal. You can get this at your pharmacy or grocery store. Follow the instructions on the package. Try putting baking soda paste on your skin. Stir water into baking soda until it gets like a paste. Try putting on a lotion to help with itching (calamine lotion). Keep cool. Stay out of the sun. Sweating and being hot can make itching worse. General instructions  Rest as needed. Drink enough fluid to keep your pee (urine) pale yellow. Wear loose-fitting clothes. Avoid scented soaps, detergents, and perfumes. Use gentle soaps, detergents, perfumes, and cosmetics. Avoid the things that cause your rash. Keep a journal to help keep track of what causes your rash. Write down: What you eat. What cosmetics you use. What you drink. What you wear. This includes jewelry. Contact a doctor if: You sweat a lot at night. You pee (urinate) more or less than normal. Your pee is a darker color than normal. Your eyes are sensitive to light. Your skin or the white parts of your  eyes turn yellow. Your skin tingles or is numb. You get painful blisters in your nose or mouth. Your rash does not go away after a few days, or it gets worse. You are more tired than normal. You are more thirsty than normal. You have new or worse symptoms. These may include: Pain in your belly. A fever. Watery poop (diarrhea). Vomiting. Weakness. Weight loss. Get help right away if: You start to feel mixed up (confused). You have a very bad headache or a stiff neck. You have very bad joint pain or stiffness. You get very sleepy or not responsive. You have a seizure. This information is not intended to replace advice given to you by your health care provider. Make sure you discuss any questions you have with your health care provider. Document Revised: 10/26/2021 Document Reviewed: 10/26/2021 Elsevier Patient Education  2024 [employer].

## 2023-06-25 ENCOUNTER — Ambulatory Visit (INDEPENDENT_AMBULATORY_CARE_PROVIDER_SITE_OTHER): Payer: Medicare (Managed Care) | Admitting: Nurse Practitioner

## 2023-06-25 ENCOUNTER — Encounter: Payer: Self-pay | Admitting: Nurse Practitioner

## 2023-06-25 VITALS — BP 138/82 | HR 75 | Temp 98.2°F | Ht 63.0 in | Wt 181.2 lb

## 2023-06-25 DIAGNOSIS — Z599 Problem related to housing and economic circumstances, unspecified: Secondary | ICD-10-CM | POA: Diagnosis not present

## 2023-06-25 DIAGNOSIS — R21 Rash and other nonspecific skin eruption: Secondary | ICD-10-CM | POA: Diagnosis not present

## 2023-06-25 MED ORDER — FLUTICASONE PROPIONATE 50 MCG/ACT NA SUSP: 2.0000 | Freq: Every day | NASAL | 6 refills | Status: DC

## 2023-06-25 MED ORDER — TRIAMCINOLONE ACETONIDE 0.1 % EX CREA: 1.0000 | TOPICAL_CREAM | Freq: Two times a day (BID) | CUTANEOUS | 2 refills | Status: DC

## 2023-06-25 MED ORDER — LEVOCETIRIZINE DIHYDROCHLORIDE 5 MG PO TABS: 5.0000 mg | ORAL_TABLET | Freq: Every evening | ORAL | 2 refills | Status: DC

## 2023-06-25 MED ORDER — PREDNISONE 10 MG PO TABS: ORAL_TABLET | ORAL | 0 refills | Status: DC

## 2023-06-25 NOTE — Assessment & Plan Note (Signed)
 On and off.  Concern for bug bites.  Will send in Triamcinolone  cream + a steroid taper.  Sent in Xyzal and Flonase to help with current skin pruritus and nasal allergy symptoms.  Educated patient on this.

## 2023-06-25 NOTE — Progress Notes (Signed)
 BP 138/82 (BP Location: Left Arm, Patient Position: Sitting, Cuff Size: Normal)   Pulse 75   Temp 98.2 F (36.8 C) (Oral)   Ht 5\' 3"  (1.6 m)   Wt 181 lb 3.2 oz (82.2 kg)   SpO2 97%   BMI 32.10 kg/m    Subjective:    Patient ID: Beth Parker, female    DOB: 24-Mar-1955, 68 y.o.   MRN: [id number]  HPI: Beth Parker is a 68 y.o. female  Chief Complaint  Patient presents with   Rash   RASH Has been having spots all over for months. Used to have issues with lady bugs in house, but got rid of them.  Occasional ants in home and spiders.  Uses back scratcher to get back area.   Duration:  weeks  Location: generalized  Itching: yes Burning: no Redness: yes Oozing: no Scaling: no Blisters: no Painful: no Fevers: no Change in detergents/soaps/personal care products: no Recent illness: yes Recent travel:no History of same: no Context: fluctuating Alleviating factors: nothing Treatments attempted:hydrocortisone cream Shortness of breath: no  Throat/tongue swelling: no Myalgias/arthralgias: no  Relevant past medical, surgical, family and social history reviewed and updated as indicated. Interim medical history since our last visit reviewed. Allergies and medications reviewed and updated.  Review of Systems  Constitutional:  Negative for activity change, appetite change, diaphoresis, fatigue and fever.  Respiratory:  Negative for cough, chest tightness, shortness of breath and wheezing.   Cardiovascular:  Negative for chest pain, palpitations and leg swelling.  Gastrointestinal: Negative.   Skin:  Positive for rash.  Neurological: Negative.   Psychiatric/Behavioral:  Negative for decreased concentration, self-injury, sleep disturbance and suicidal ideas. The patient is nervous/anxious.     Per HPI unless specifically indicated above     Objective:     BP 138/82 (BP Location: Left Arm, Patient Position: Sitting, Cuff Size: Normal)   Pulse 75   Temp 98.2 F (36.8  C) (Oral)   Ht 5\' 3"  (1.6 m)   Wt 181 lb 3.2 oz (82.2 kg)   SpO2 97%   BMI 32.10 kg/m   Wt Readings from Last 3 Encounters:  06/25/23 181 lb 3.2 oz (82.2 kg)  05/19/23 186 lb 12.8 oz (84.7 kg)  05/06/23 190 lb 3.2 oz (86.3 kg)    Physical Exam Vitals and nursing note reviewed.  Constitutional:      General: She is awake. She is not in acute distress.    Appearance: She is well-developed and well-groomed. She is obese. She is not ill-appearing or toxic-appearing.  HENT:     Head: Normocephalic.     Right Ear: Hearing and external ear normal.     Left Ear: Hearing and external ear normal.  Eyes:     General: Lids are normal.        Right eye: No discharge.        Left eye: No discharge.     Conjunctiva/sclera: Conjunctivae normal.     Pupils: Pupils are equal, round, and reactive to light.  Neck:     Thyroid: No thyromegaly.     Vascular: No carotid bruit.  Cardiovascular:     Rate and Rhythm: Normal rate and regular rhythm.     Heart sounds: Normal heart sounds. No murmur heard.    No gallop.  Pulmonary:     Effort: Pulmonary effort is normal. No accessory muscle usage or respiratory distress.     Breath sounds: Normal breath sounds.  Abdominal:  General: Bowel sounds are normal. There is no distension.     Palpations: Abdomen is soft.     Tenderness: There is no abdominal tenderness.  Musculoskeletal:     Cervical back: Normal range of motion and neck supple.     Right lower leg: No edema.     Left lower leg: No edema.  Lymphadenopathy:     Cervical: No cervical adenopathy.  Skin:    General: Skin is warm and dry.     Findings: Rash present. Rash is papular.     Comments: Scattered singular, round bite-like rash areas. No blisters.  Red and raised.  To neck, back, and leg.  Neurological:     Mental Status: She is alert and oriented to person, place, and time.     Deep Tendon Reflexes: Reflexes are normal and symmetric.     Reflex Scores:      Brachioradialis  reflexes are 2+ on the right side and 2+ on the left side.      Patellar reflexes are 2+ on the right side and 2+ on the left side. Psychiatric:        Attention and Perception: Attention normal.        Mood and Affect: Mood normal.        Speech: Speech normal.        Behavior: Behavior normal. Behavior is cooperative.        Thought Content: Thought content normal.     Results for orders placed or performed in visit on 01/07/23  Microscopic Examination   Collection Time: 01/07/23  3:22 PM   Urine  Result Value Ref Range   WBC, UA 6-10 (A) 0 - 5 /hpf   RBC, Urine 11-30 (A) 0 - 2 /hpf   Epithelial Cells (non renal) 0-10 0 - 10 /hpf   Mucus, UA Present (A) Not Estab.   Bacteria, UA None seen None seen/Few  Urinalysis, Routine w reflex microscopic   Collection Time: 01/07/23  3:22 PM  Result Value Ref Range   Specific Gravity, UA 1.025 1.005 - 1.030   pH, UA 6.0 5.0 - 7.5   Color, UA Yellow Yellow   Appearance Ur Cloudy (A) Clear   Leukocytes,UA 1+ (A) Negative   Protein,UA 2+ (A) Negative/Trace   Glucose, UA Negative Negative   Ketones, UA Negative Negative   RBC, UA 3+ (A) Negative   Bilirubin, UA Negative Negative   Urobilinogen, Ur 0.2 0.2 - 1.0 mg/dL   Nitrite, UA Negative Negative   Microscopic Examination See below:   Urine Culture   Collection Time: 01/07/23  3:26 PM   Specimen: Urine   UR  Result Value Ref Range   Urine Culture, Routine Final report (A)    Organism ID, Bacteria Escherichia coli (A)    ORGANISM ID, BACTERIA Comment    Antimicrobial Susceptibility Comment       Assessment & Plan:   Problem List Items Addressed This Visit       Musculoskeletal and Integument   Rash - Primary   On and off.  Concern for bug bites.  Will send in Triamcinolone  cream + a steroid taper.  Sent in Xyzal and Flonase to help with current skin pruritus and nasal allergy symptoms.  Educated patient on this.      Other Visit Diagnoses       Financial difficulties        Referral to [facility].   Relevant Orders   AMB Referral [facility] Care Management  Follow up plan: Return for as scheduled August 18th.

## 2023-06-26 ENCOUNTER — Telehealth: Payer: Self-pay

## 2023-06-26 NOTE — Progress Notes (Signed)
 Complex Care Management Note Care Guide Note  06/26/2023 Name: Beth Parker MRN: [id number] DOB: 08-Jul-1955   Complex Care Management Outreach Attempts: An unsuccessful telephone outreach was attempted today to offer the patient information about available complex care management services.  Follow Up Plan:  Additional outreach attempts will be made to offer the patient complex care management information and services.   Encounter Outcome:  No Answer  Lenton Rail , RMA     [place]  [facility], [facility] Guide  Direct Dial: [phone]  Website: [email or web address]

## 2023-07-01 NOTE — Progress Notes (Unsigned)
 Complex Care Management Note Care Guide Note  07/01/2023 Name: Beth Parker MRN: [id number] DOB: [birth year]   Complex Care Management Outreach Attempts: A second unsuccessful outreach was attempted today to offer the patient with information about available complex care management services.  Follow Up Plan:  Additional outreach attempts will be made to offer the patient complex care management information and services.   Encounter Outcome:  No Answer  Lenton Rail , RMA     [place]  [facility], [facility] Guide  Direct Dial: [phone]  Website: [email or web address]

## 2023-07-03 NOTE — Progress Notes (Signed)
 Complex Care Management Note Care Guide Note  07/03/2023 Name: Beth Parker MRN: [id number] DOB: Apr 19, 1955   Complex Care Management Outreach Attempts: A third unsuccessful outreach was attempted today to offer the patient with information about available complex care management services.  Follow Up Plan:  No further outreach attempts will be made at this time. We have been unable to contact the patient to offer or enroll patient in complex care management services.  Encounter Outcome:  No Answer  Lenton Rail , RMA     [place]  [facility], [facility] Guide  Direct Dial: [phone]  Website: [email or web address]

## 2023-07-21 ENCOUNTER — Telehealth: Payer: Self-pay | Admitting: Nurse Practitioner

## 2023-07-21 NOTE — Telephone Encounter (Signed)
 Copied from CRM [id number]. Topic: General - Other >> Jul 21, 2023  9:50 AM DeAngela L wrote: Reason for CRM: patient needs a doctors note for court and she would like pick it up today and turn it in to the court (she can't sit that long and she has a bladder problem and uses the restroom a lot and her back hurts if she sits that long period of time) Pt num [phone]

## 2023-07-21 NOTE — Telephone Encounter (Signed)
 Called and spoke with the patient. Needed additional information from the patient for the letter if provider approves.   Juror Number: [id number] Jury Duty Date: 08/19/23

## 2023-07-22 ENCOUNTER — Encounter: Payer: Self-pay | Admitting: Nurse Practitioner

## 2023-07-22 NOTE — Telephone Encounter (Signed)
 Letter placed in the bin for the patient to pick up. Called and notified her that it was ready.

## 2023-09-06 NOTE — Patient Instructions (Signed)

## 2023-09-08 ENCOUNTER — Encounter: Payer: Self-pay | Admitting: Nurse Practitioner

## 2023-09-08 ENCOUNTER — Ambulatory Visit (INDEPENDENT_AMBULATORY_CARE_PROVIDER_SITE_OTHER): Payer: Medicare (Managed Care) | Admitting: Nurse Practitioner

## 2023-09-08 VITALS — BP 140/84 | HR 65 | Temp 98.2°F | Ht 63.0 in | Wt 182.0 lb

## 2023-09-08 DIAGNOSIS — I10 Essential (primary) hypertension: Secondary | ICD-10-CM

## 2023-09-08 DIAGNOSIS — F5104 Psychophysiologic insomnia: Secondary | ICD-10-CM

## 2023-09-08 DIAGNOSIS — E559 Vitamin D deficiency, unspecified: Secondary | ICD-10-CM

## 2023-09-08 DIAGNOSIS — R7301 Impaired fasting glucose: Secondary | ICD-10-CM

## 2023-09-08 DIAGNOSIS — F1911 Other psychoactive substance abuse, in remission: Secondary | ICD-10-CM | POA: Diagnosis not present

## 2023-09-08 DIAGNOSIS — Z599 Problem related to housing and economic circumstances, unspecified: Secondary | ICD-10-CM

## 2023-09-08 DIAGNOSIS — E6609 Other obesity due to excess calories: Secondary | ICD-10-CM

## 2023-09-08 DIAGNOSIS — E782 Mixed hyperlipidemia: Secondary | ICD-10-CM | POA: Diagnosis not present

## 2023-09-08 DIAGNOSIS — F339 Major depressive disorder, recurrent, unspecified: Secondary | ICD-10-CM | POA: Diagnosis not present

## 2023-09-08 DIAGNOSIS — Z6834 Body mass index (BMI) 34.0-34.9, adult: Secondary | ICD-10-CM

## 2023-09-08 DIAGNOSIS — E66811 Obesity, class 1: Secondary | ICD-10-CM

## 2023-09-08 DIAGNOSIS — M545 Low back pain, unspecified: Secondary | ICD-10-CM

## 2023-09-08 DIAGNOSIS — E538 Deficiency of other specified B group vitamins: Secondary | ICD-10-CM

## 2023-09-08 MED ORDER — TRIAMCINOLONE ACETONIDE 0.1 % EX CREA: 1.0000 | TOPICAL_CREAM | Freq: Two times a day (BID) | CUTANEOUS | 2 refills | Status: DC

## 2023-09-08 MED ORDER — OMEPRAZOLE 40 MG PO CPDR: 40.0000 mg | DELAYED_RELEASE_CAPSULE | Freq: Every day | ORAL | 4 refills | Status: AC

## 2023-09-08 MED ORDER — LISINOPRIL 40 MG PO TABS: 40.0000 mg | ORAL_TABLET | Freq: Every day | ORAL | 4 refills | Status: AC

## 2023-09-08 NOTE — Assessment & Plan Note (Signed)
 She denies any further use.  Would avoid any addictive medications on a long term basis with her due to concerns for abuse.

## 2023-09-08 NOTE — Progress Notes (Addendum)
 BP (!) 140/84 (BP Location: Left Arm, Patient Position: Sitting, Cuff Size: Normal)   Pulse 65   Temp 98.2 F (36.8 C) (Oral)   Ht 5' 3 (1.6 m)   Wt 182 lb (82.6 kg)   SpO2 96%   BMI 32.24 kg/m    Subjective:    Patient ID: Beth Parker, female    DOB: 12-24-1955, 68 y.o.   MRN: [id number]  HPI: Beth Parker is a 68 y.o. female  Chief Complaint  Patient presents with   Anxiety   Hyperlipidemia   Hypertension   Pain   They attempted to draw labs, no success on first one and she refused to have them try again reporting she had places to go.  HYPERTENSION without Chronic Kidney Disease Continues to not take medication, refuses to restart these -- only taking Lisinopril  and Omeprazole .  Continues Metoprolol  and Lisinopril .  Atorvastatin  for HLD. Cut her food stamps reduced from $214 to $24 recently + needs help with gas.  She is in need of assistance with food. Is not taking supplements for her low B12 and Vitamin D.  Hypertension status: stable  Satisfied with current treatment? yes Duration of hypertension: chronic BP monitoring frequency:  a few times a week BP range: 116/67 to 153/83 -- on average <130/80 BP medication side effects:  no Medication compliance: good compliance Aspirin: no Recurrent headaches: no Visual changes: no Palpitations: no Dyspnea: no Chest pain: does get occasional tightness and burning Lower extremity edema: no Dizzy/lightheaded: no   Impaired Fasting Glucose Duration of elevated blood sugar: chronic Polydipsia: no Polyuria: no Weight change: no Visual disturbance: no Glucose Monitoring: no    Accucheck frequency: Not Checking    Fasting glucose:     Post prandial:  Diabetic Education: Not Completed Family history of diabetes: no      09/08/2023    1:42 PM 05/06/2023   10:24 AM 04/29/2023    1:23 PM 04/07/2023    1:55 PM 03/10/2023   11:13 AM  Depression screen PHQ 2/9  Decreased Interest 1 0 0 0 0  Down, Depressed, Hopeless  1 0 0 0 0  PHQ - 2 Score 2 0 0 0 0  Altered sleeping 1 0 0 3 1  Tired, decreased energy 2 0 0 3 0  Change in appetite 0 0 0 1 2  Feeling bad or failure about yourself  0 0 0 0 0  Trouble concentrating 0 0 0 1 0  Moving slowly or fidgety/restless 0 0 0 0 0  Suicidal thoughts 0 0 0 0 0  PHQ-9 Score 5 0 0 8 3  Difficult doing work/chores Somewhat difficult Not difficult at all Not difficult at all Not difficult at all Not difficult at all       09/08/2023    1:43 PM 05/06/2023   10:24 AM 04/29/2023    1:23 PM 04/07/2023    1:55 PM  GAD 7 : Generalized Anxiety Score  Nervous, Anxious, on Edge 1 0 0 1  Control/stop worrying 0 0 0 0  Worry too much - different things 0 0 0 0  Trouble relaxing 1 0 0 0  Restless 0 0 0 0  Easily annoyed or irritable 1 0 0 0  Afraid - awful might happen 1 0 0 0  Total GAD 7 Score 4 0 0 1  Anxiety Difficulty Somewhat difficult Not difficult at all Not difficult at all Not difficult at all   Relevant past medical,  surgical, family and social history reviewed and updated as indicated. Interim medical history since our last visit reviewed. Allergies and medications reviewed and updated.  Review of Systems  Constitutional:  Negative for activity change, appetite change, diaphoresis, fatigue and fever.  Respiratory:  Negative for cough, chest tightness, shortness of breath and wheezing.   Cardiovascular:  Negative for chest pain, palpitations and leg swelling.  Gastrointestinal: Negative.   Neurological: Negative.   Psychiatric/Behavioral:  Negative for decreased concentration, self-injury, sleep disturbance and suicidal ideas. The patient is nervous/anxious.     Per HPI unless specifically indicated above     Objective:    BP (!) 140/84 (BP Location: Left Arm, Patient Position: Sitting, Cuff Size: Normal)   Pulse 65   Temp 98.2 F (36.8 C) (Oral)   Ht 5' 3 (1.6 m)   Wt 182 lb (82.6 kg)   SpO2 96%   BMI 32.24 kg/m   Wt Readings from Last 3  Encounters:  09/08/23 182 lb (82.6 kg)  06/25/23 181 lb 3.2 oz (82.2 kg)  05/19/23 186 lb 12.8 oz (84.7 kg)    Physical Exam Vitals and nursing note reviewed.  Constitutional:      General: She is awake. She is not in acute distress.    Appearance: She is well-developed and well-groomed. She is obese. She is not ill-appearing or toxic-appearing.  HENT:     Head: Normocephalic.     Right Ear: Hearing and external ear normal.     Left Ear: Hearing and external ear normal.  Eyes:     General: Lids are normal.        Right eye: No discharge.        Left eye: No discharge.     Conjunctiva/sclera: Conjunctivae normal.     Pupils: Pupils are equal, round, and reactive to light.  Neck:     Thyroid: No thyromegaly.     Vascular: No carotid bruit.  Cardiovascular:     Rate and Rhythm: Normal rate and regular rhythm.     Heart sounds: Normal heart sounds. No murmur heard.    No gallop.  Pulmonary:     Effort: Pulmonary effort is normal. No accessory muscle usage or respiratory distress.     Breath sounds: Normal breath sounds.  Abdominal:     General: Bowel sounds are normal. There is no distension.     Palpations: Abdomen is soft.     Tenderness: There is no abdominal tenderness.  Musculoskeletal:     Cervical back: Normal range of motion and neck supple.     Right lower leg: No edema.     Left lower leg: No edema.  Lymphadenopathy:     Cervical: No cervical adenopathy.  Skin:    General: Skin is warm and dry.  Neurological:     Mental Status: She is alert and oriented to person, place, and time.     Deep Tendon Reflexes: Reflexes are normal and symmetric.     Reflex Scores:      Brachioradialis reflexes are 2+ on the right side and 2+ on the left side.      Patellar reflexes are 2+ on the right side and 2+ on the left side. Psychiatric:        Attention and Perception: Attention normal.        Mood and Affect: Mood normal.        Speech: Speech normal.        Behavior:  Behavior normal. Behavior is cooperative.  Thought Content: Thought content normal.    Results for orders placed or performed in visit on 01/07/23  Microscopic Examination   Collection Time: 01/07/23  3:22 PM   Urine  Result Value Ref Range   WBC, UA 6-10 (A) 0 - 5 /hpf   RBC, Urine 11-30 (A) 0 - 2 /hpf   Epithelial Cells (non renal) 0-10 0 - 10 /hpf   Mucus, UA Present (A) Not Estab.   Bacteria, UA None seen None seen/Few  Urinalysis, Routine w reflex microscopic   Collection Time: 01/07/23  3:22 PM  Result Value Ref Range   Specific Gravity, UA 1.025 1.005 - 1.030   pH, UA 6.0 5.0 - 7.5   Color, UA Yellow Yellow   Appearance Ur Cloudy (A) Clear   Leukocytes,UA 1+ (A) Negative   Protein,UA 2+ (A) Negative/Trace   Glucose, UA Negative Negative   Ketones, UA Negative Negative   RBC, UA 3+ (A) Negative   Bilirubin, UA Negative Negative   Urobilinogen, Ur 0.2 0.2 - 1.0 mg/dL   Nitrite, UA Negative Negative   Microscopic Examination See below:   Urine Culture   Collection Time: 01/07/23  3:26 PM   Specimen: Urine   UR  Result Value Ref Range   Urine Culture, Routine Final report (A)    Organism ID, Bacteria Escherichia coli (A)    ORGANISM ID, BACTERIA Comment    Antimicrobial Susceptibility Comment       Assessment & Plan:   Problem List Items Addressed This Visit       Cardiovascular and Mediastinum   Essential hypertension   Chronic, ongoing. BP trending on average <130/80 at home, but occasional elevations present.  Initially elevated today, improving on recheck but still above goal.  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  Is not taking Metoprolol  or Hydralazine  as ordered because of side effects she read about online, we have discussed many times side effect profiles and she was tolerating these.  DASH diet at home.  Continue current medication regimen: only taking Lisinopril , but recommend she restart Hydralazine  which was offering benefit.   She refuses other medications at this time.  LABS: CBC, CMP, TSH.      Relevant Medications   lisinopril  (ZESTRIL ) 40 MG tablet   Other Relevant Orders   CBC with Differential/Platelet   Comprehensive metabolic panel with GFR   TSH     Endocrine   IFG (impaired fasting glucose)   Check A1c today.      Relevant Orders   HgB A1c     Other   Vitamin D deficiency   Chronic, recheck level today and initiate supplement as needed.      Relevant Orders   VITAMIN D 25 Hydroxy (Vit-D Deficiency, Fractures)   Obesity   BMI 32.24.  Recommended eating smaller high protein, low fat meals more frequently and exercising 30 mins a day 5 times a week with a goal of 10-15lb weight loss in the next 3 months. Patient voiced their understanding and motivation to adhere to these recommendations.       Mixed hyperlipidemia   Chronic, ongoing.  Not taking medication as instructed due to concern about side effects she read online, have highly recommended she restart due to risk for stroke or CVA.  Discussed side effects at length with her.  Refuses to take medication.      Relevant Medications   lisinopril  (ZESTRIL ) 40 MG tablet   Other Relevant Orders  Comprehensive metabolic panel with GFR   Lipid Panel w/o Chol/HDL Ratio   History of drug abuse (HCC)   She denies any further use.  Would avoid any addictive medications on a long term basis with her due to concerns for abuse.      Depression, recurrent (HCC) - Primary   Chronic, ongoing.  Denies Si/HI.  Has strong faith in God per her report and goes to two different churches.  States Xanax  is only thing that has helped her in past and is insistent it is only thing that will work, have had discussions on long term use of this with her + addictive element, although she reports not wanting to take anything addictive or harmful for her brain.  She has tried multiple medications in past, not taking Buspar . She did not take Lexapro  due to concerns for  harm to her brain.  Referral placed to psychiatry past visit since she has been on multiple medications in past and these did not work, but she refuses to go.        B12 deficiency   Chronic, recheck level today and initiate supplement as needed.      Relevant Orders   Vitamin B12   Other Visit Diagnoses       Financial difficulty       Referral to [facility].   Relevant Orders   AMB Referral [facility] Care Management        Follow up plan: Return in about 6 months (around 03/10/2024) for HTN/HLD, PAIN, ANXIETY.

## 2023-09-08 NOTE — Assessment & Plan Note (Signed)
BMI 32.24.  Recommended eating smaller high protein, low fat meals more frequently and exercising 30 mins a day 5 times a week with a goal of 10-15lb weight loss in the next 3 months. Patient voiced their understanding and motivation to adhere to these recommendations.

## 2023-09-08 NOTE — Assessment & Plan Note (Signed)
 Chronic, recheck level today and initiate supplement as needed.

## 2023-09-08 NOTE — Assessment & Plan Note (Signed)
 Chronic, ongoing.  Not taking medication as instructed due to concern about side effects she read online, have highly recommended she restart due to risk for stroke or CVA.  Discussed side effects at length with her.  Refuses to take medication.

## 2023-09-08 NOTE — Addendum Note (Signed)
 Addended by: Lateka Rady T on: 09/08/2023 02:18 PM   Modules accepted: Orders

## 2023-09-08 NOTE — Assessment & Plan Note (Signed)
 Check A1c today.

## 2023-09-08 NOTE — Assessment & Plan Note (Addendum)
 Chronic, ongoing. BP trending on average <130/80 at home, but occasional elevations present.  Initially elevated today, improving on recheck but still above goal.  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  Is not taking Metoprolol  or Hydralazine  as ordered because of side effects she read about online, we have discussed many times side effect profiles and she was tolerating these.  DASH diet at home.  Continue current medication regimen: only taking Lisinopril , but recommend she restart Hydralazine  which was offering benefit.  She refuses other medications at this time.  LABS: CBC, CMP, TSH.

## 2023-09-08 NOTE — Assessment & Plan Note (Signed)
 Chronic, ongoing.  Denies Si/HI.  Has strong faith in God per her report and goes to two different churches.  States Xanax  is only thing that has helped her in past and is insistent it is only thing that will work, have had discussions on long term use of this with her + addictive element, although she reports not wanting to take anything addictive or harmful for her brain.  She has tried multiple medications in past, not taking Buspar . She did not take Lexapro  due to concerns for harm to her brain.  Referral placed to psychiatry past visit since she has been on multiple medications in past and these did not work, but she refuses to go.

## 2023-09-09 ENCOUNTER — Telehealth: Payer: Self-pay

## 2023-09-09 NOTE — Progress Notes (Signed)
 Complex Care Management Note  Care Guide Note 09/09/2023 Name: Beth Parker MRN: [id number] DOB: [birth year]  Beth Parker is a 68 y.o. year old female who sees [place], Melanie T, NP for primary care. I reached out to Darice GORMAN Ochs by phone today to offer complex care management services.  Ms. Baldi was given information about Complex Care Management services today including:   The Complex Care Management services include support from the care team which includes your Nurse Care Manager, Clinical Social Worker, or Pharmacist.  The Complex Care Management team is here to help remove barriers to the health concerns and goals most important to you. Complex Care Management services are voluntary, and the patient may decline or stop services at any time by request to their care team member.   Complex Care Management Consent Status: Patient agreed to services and verbal consent obtained.   Follow up plan:  Telephone appointment with complex care management team member scheduled for:  BSW 09/11/2023 RNCM   Encounter Outcome:  Patient Scheduled  Beth Parker , RMA     [place]  [facility], [address] [facility] Guide  Direct Dial: [phone]  Website: [email or web address]

## 2023-09-09 NOTE — Progress Notes (Signed)
 Complex Care Management Note Care Guide Note  09/09/2023 Name: Beth Parker MRN: [id number] DOB: 1955-02-03   Complex Care Management Outreach Attempts: An unsuccessful telephone outreach was attempted today to offer the patient information about available complex care management services.  Follow Up Plan:  Additional outreach attempts will be made to offer the patient complex care management information and services.   Encounter Outcome:  No Answer  Jeoffrey Buffalo , RMA     [place]  [facility], [facility] Guide  Direct Dial: [phone]  Website: [email or web address]

## 2023-09-11 ENCOUNTER — Telehealth: Payer: Self-pay

## 2023-09-23 ENCOUNTER — Telehealth: Payer: Self-pay

## 2023-09-30 ENCOUNTER — Ambulatory Visit (INDEPENDENT_AMBULATORY_CARE_PROVIDER_SITE_OTHER): Admitting: Nurse Practitioner

## 2023-09-30 ENCOUNTER — Telehealth: Payer: Self-pay

## 2023-09-30 ENCOUNTER — Encounter: Payer: Self-pay | Admitting: Nurse Practitioner

## 2023-09-30 VITALS — BP 138/84 | HR 66 | Temp 98.6°F | Resp 15 | Ht 62.99 in | Wt 184.2 lb

## 2023-09-30 DIAGNOSIS — F339 Major depressive disorder, recurrent, unspecified: Secondary | ICD-10-CM | POA: Diagnosis not present

## 2023-09-30 DIAGNOSIS — R21 Rash and other nonspecific skin eruption: Secondary | ICD-10-CM | POA: Diagnosis not present

## 2023-09-30 DIAGNOSIS — E782 Mixed hyperlipidemia: Secondary | ICD-10-CM | POA: Diagnosis not present

## 2023-09-30 DIAGNOSIS — I10 Essential (primary) hypertension: Secondary | ICD-10-CM

## 2023-09-30 DIAGNOSIS — Z87898 Personal history of other specified conditions: Secondary | ICD-10-CM

## 2023-09-30 DIAGNOSIS — F419 Anxiety disorder, unspecified: Secondary | ICD-10-CM

## 2023-09-30 MED ORDER — NYSTATIN 100000 UNIT/GM EX POWD: 1.0000 | Freq: Three times a day (TID) | CUTANEOUS | 2 refills | Status: AC

## 2023-09-30 MED ORDER — MUPIROCIN 2 % EX OINT: 1.0000 | TOPICAL_OINTMENT | Freq: Two times a day (BID) | CUTANEOUS | 3 refills | Status: AC

## 2023-09-30 MED ORDER — FLUCONAZOLE 150 MG PO TABS: ORAL_TABLET | ORAL | 0 refills | Status: AC

## 2023-09-30 MED ORDER — CLOTRIMAZOLE-BETAMETHASONE 1-0.05 % EX CREA: 1.0000 | TOPICAL_CREAM | Freq: Every day | CUTANEOUS | 4 refills | Status: AC

## 2023-09-30 NOTE — Assessment & Plan Note (Signed)
 Chronic, ongoing. BP trending down today but occasional elevations present.  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 Recommend she continue Lisinopril  daily, but to also take one pill of Hydralazine  daily with this and not take as needed only -- if tolerates and BP remains >130/80 over next few weeks increase to two pills of Hydralazine  daily.  DASH diet at home.  Continue current medication regimen.  She refuses other medications at this time and refuses to have labs drawn.

## 2023-09-30 NOTE — Progress Notes (Unsigned)
 Complex Care Management Care Guide Note  09/30/2023 Name: Beth Parker MRN: [id number] DOB: Apr 18, 1955  Beth Parker is a 68 y.o. year old female who is a primary care patient of Cannady, Jolene T, NP and is actively engaged with the care management team. I reached out to Darice GORMAN Ochs by phone today to assist with re-scheduling  with the RN Case Manager BSW.  Follow up plan: Unsuccessful telephone outreach attempt made. A HIPAA compliant phone message was left for the patient providing contact information and requesting a return call.  Jeoffrey Buffalo , RMA     [facility]  [facility], [facility] Guide  Direct Dial: [phone]  Website: [email or web address]

## 2023-09-30 NOTE — Assessment & Plan Note (Signed)
 Was prescribed by Dr. Montell in past per her report, many years ago.  Discussed with her that these are not recommended in patients over 65.  Educated on alternate options, however she is adamant Xanax  is only thing that has worked, but at same time she reports not wanting to take anything addictive.  She states Xanax  were not addictive for her. Referral to psychiatry.

## 2023-09-30 NOTE — Assessment & Plan Note (Signed)
 Chronic, ongoing.  Denies SI/HI.  Has strong faith in God per her report and goes to two different churches.  States Xanax  is only thing that has helped her in past and is insistent it is only thing that will work, have had discussions on long term use of this with her + addictive element, although she reports not wanting to take anything addictive or harmful for her brain.  She has tried multiple medications in past per her report, not taking Buspar . She did not take Lexapro  due to concerns for harm to her brain.  She agrees to another referral to psychiatry for further evaluation and discussions on appropriate treatment.  May benefit therapy too.

## 2023-09-30 NOTE — Assessment & Plan Note (Signed)
 Chronic, ongoing.  Not taking medication as instructed due to concern about side effects she read online, have highly recommended she restart due to risk for stroke or CVA.  Discussed side effects at length with her.  Refuses to take medication and refuses labs today.

## 2023-09-30 NOTE — Patient Instructions (Signed)
 AMBER with the [facility] == [phone]   Rash, Adult  A rash is a breakout of spots or blotches on the skin. It can change the way your skin looks and feels. Many things can cause a rash. The goal of treatment is to stop the itching and keep the rash from spreading. Follow these instructions at home: Medicine Take or apply over-the-counter and prescription medicines only as told by your doctor. These may include medicines to treat: Red or swollen skin. Itching. An allergy. Pain. An infection.  Skin care Put a cool, wet cloth on the rash. Do not scratch or rub your skin. Try not to cover the rash. Keep it exposed to air as often as you can. Managing itching and discomfort Avoid hot showers or baths. These can make itching worse. A cold shower may help. Try taking a bath with: Epsom salts. You can get these at your pharmacy or grocery store. Follow the instructions on the package. Baking soda. Pour a small amount into the bath as told by your doctor. Colloidal oatmeal. You can get this at your pharmacy or grocery store. Follow the instructions on the package. Try putting baking soda paste on your skin. Stir water into baking soda until it gets like a paste. Try putting on a lotion to help with itching (calamine lotion). Keep cool. Stay out of the sun. Sweating and being hot can make itching worse. General instructions  Rest as needed. Drink enough fluid to keep your pee (urine) pale yellow. Wear loose-fitting clothes. Avoid scented soaps, detergents, and perfumes. Use gentle soaps, detergents, perfumes, and cosmetics. Avoid the things that cause your rash. Keep a journal to help keep track of what causes your rash. Write down: What you eat. What cosmetics you use. What you drink. What you wear. This includes jewelry. Contact a doctor if: You sweat a lot at night. You pee (urinate) more or less than normal. Your pee is a darker color than normal. Your eyes are  sensitive to light. Your skin or the white parts of your eyes turn yellow. Your skin tingles or is numb. You get painful blisters in your nose or mouth. Your rash does not go away after a few days, or it gets worse. You are more tired than normal. You are more thirsty than normal. You have new or worse symptoms. These may include: Pain in your belly. A fever. Watery poop (diarrhea). Vomiting. Weakness. Weight loss. Get help right away if: You start to feel mixed up (confused). You have a very bad headache or a stiff neck. You have very bad joint pain or stiffness. You get very sleepy or not responsive. You have a seizure. This information is not intended to replace advice given to you by your health care provider. Make sure you discuss any questions you have with your health care provider. Document Revised: 10/26/2021 Document Reviewed: 10/26/2021 Elsevier Patient Education  2024 [employer].

## 2023-09-30 NOTE — Assessment & Plan Note (Signed)
 Refer to depression plan of care.

## 2023-09-30 NOTE — Assessment & Plan Note (Signed)
 Acute, appears fungal.  She never got Nystatin  from pharmacy at past visit, reports they did not fill.  Will resend today along with steroid cream and abx ointment to apply to areas of possible bug bites noted as well.

## 2023-09-30 NOTE — Progress Notes (Signed)
 BP 138/84 (BP Location: Left Arm, Patient Position: Sitting)   Pulse 66   Temp 98.6 F (37 C) (Oral)   Resp 15   Ht 5' 2.99 (1.6 m)   Wt 184 lb 3.2 oz (83.6 kg)   SpO2 96%   BMI 32.64 kg/m    Subjective:    Patient ID: Beth Parker, female    DOB: 06/15/1955, 68 y.o.   MRN: [id number]  HPI: Beth Parker is a 68 y.o. female  Chief Complaint  Patient presents with   Rash    Started as spots and now is larger and spread all over. Ongoing about a year but over the last month has gotten worse.    Anxiety    Would like something for her nerves.    Hypertension    Says she was on a water pill before (hydrochlorothiazide) and would take 1/2 a pill when she needed it.    RASH Has rash to under both axilla and to groin area.  Denies any bed bugs at home.  Sees little mosquitos at home. Does not take any allergy medications at home. Has cat at home who has skin issues, she does not know what it is. Duration:  weeks  Location: arms and groin  Itching: yes Burning: yes Redness: yes Oozing: no Scaling: no Blisters: no Painful: no Fevers: no Change in detergents/soaps/personal care products: no Recent illness: no Recent travel:no History of same: yes Context: fluctuating Alleviating factors: nothing Treatments attempted: steroid cream -- never received anti fungal cream or powder from pharmacy -- she reports they never gave to her Shortness of breath: no  Throat/tongue swelling: no Myalgias/arthralgias: no   HYPERTENSION without Chronic Kidney Disease Taking Lisinopril  daily + takes Hydralazine  as needed -- to be taking daily. In past Metoprolol  + Atorvastatin , but reports reading about these online and did not like side effects she read.  So she does not take them.  Hypertension status: stable  Satisfied with current treatment? yes Duration of hypertension: chronic BP monitoring frequency:  daily BP range: 117/67 to 147/87 BP medication side effects:  no Medication  compliance: good compliance Aspirin: no Recurrent headaches: no Visual changes: no Palpitations: no Dyspnea: no Chest pain: no Lower extremity edema: no Dizzy/lightheaded: no   ANXIETY Had Buspar  but stopped taking as reports no benefit. Belsomra  was ordered for sleep in past but stopped taking as reports no benefit.  Never did take Lexapro  as she does not want to mess with her brain, which she continues to report these medications do as has read this online.  Have educated her multiple times on how these work. Took Xanax  in past from previous PCP in clinic many years ago and continues to report this is only thing that worked for mood and sleep.  Has been off of this for many years. Had referral to psychiatry placed in past, but never attended.  We have had many discussions that benzos are no longer considered a first line treatment for anxiety and sleep + discussed risks with long term use.   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  Mood status: stable Satisfied with current treatment?: no Symptom severity: moderate  Medication compliance: poor compliance Psychotherapy/counseling: none Depressed mood: occasional Anxious mood: yes Anhedonia: no Significant weight loss or gain: no Insomnia: yes hard to fall asleep Fatigue: no Feelings of worthlessness or guilt: no Impaired concentration/indecisiveness: no Suicidal ideations: no  Hopelessness: no Crying spells: no    09/30/2023   11:20 AM 09/08/2023    1:42 PM 05/06/2023   10:24 AM 04/29/2023    1:23 PM 04/07/2023    1:55 PM  Depression screen PHQ 2/9  Decreased Interest 0 1 0 0 0  Down, Depressed, Hopeless 1 1 0 0 0  PHQ - 2 Score 1 2 0 0 0  Altered sleeping 2 1 0 0 3  Tired, decreased energy 1 2 0 0 3  Change in appetite 1 0 0 0 1  Feeling bad or failure about yourself  0 0 0 0 0  Trouble concentrating 0 0 0 0 1  Moving slowly or fidgety/restless 0 0 0 0 0  Suicidal thoughts  0 0 0 0 0  PHQ-9 Score 5 5 0 0 8  Difficult doing work/chores Somewhat difficult Somewhat difficult Not difficult at all Not difficult at all Not difficult at all       09/30/2023   11:21 AM 09/08/2023    1:43 PM 05/06/2023   10:24 AM 04/29/2023    1:23 PM  GAD 7 : Generalized Anxiety Score  Nervous, Anxious, on Edge 1 1 0 0  Control/stop worrying 1 0 0 0  Worry too much - different things 1 0 0 0  Trouble relaxing 1 1 0 0  Restless 0 0 0 0  Easily annoyed or irritable 0 1 0 0  Afraid - awful might happen 1 1 0 0  Total GAD 7 Score 5 4 0 0  Anxiety Difficulty Somewhat difficult Somewhat difficult Not difficult at all Not difficult at all   Relevant past medical, surgical, family and social history reviewed and updated as indicated. Interim medical history since our last visit reviewed. Allergies and medications reviewed and updated.  Review of Systems  Constitutional:  Negative for activity change, appetite change, diaphoresis, fatigue and fever.  Respiratory:  Negative for cough, chest tightness, shortness of breath and wheezing.   Cardiovascular:  Negative for chest pain, palpitations and leg swelling.  Gastrointestinal: Negative.   Skin:  Positive for rash.  Neurological: Negative.   Psychiatric/Behavioral:  Positive for sleep disturbance. Negative for decreased concentration, self-injury and suicidal ideas. The patient is nervous/anxious.     Per HPI unless specifically indicated above     Objective:    BP 138/84 (BP Location: Left Arm, Patient Position: Sitting)   Pulse 66   Temp 98.6 F (37 C) (Oral)   Resp 15   Ht 5' 2.99 (1.6 m)   Wt 184 lb 3.2 oz (83.6 kg)   SpO2 96%   BMI 32.64 kg/m   Wt Readings from Last 3 Encounters:  09/30/23 184 lb 3.2 oz (83.6 kg)  09/08/23 182 lb (82.6 kg)  06/25/23 181 lb 3.2 oz (82.2 kg)    Physical Exam Vitals and nursing note reviewed.  Constitutional:      General: She is awake. She is not in acute distress.    Appearance:  She is well-developed and well-groomed. She is obese. She is not ill-appearing or toxic-appearing.  HENT:     Head: Normocephalic.     Right Ear: Hearing and external ear normal.     Left Ear: Hearing and external ear normal.  Eyes:     General: Lids are normal.        Right eye: No discharge.        Left eye: No discharge.     Conjunctiva/sclera: Conjunctivae normal.  Pupils: Pupils are equal, round, and reactive to light.  Neck:     Thyroid: No thyromegaly.     Vascular: No carotid bruit.  Cardiovascular:     Rate and Rhythm: Normal rate and regular rhythm.     Heart sounds: Normal heart sounds. No murmur heard.    No gallop.  Pulmonary:     Effort: Pulmonary effort is normal. No accessory muscle usage or respiratory distress.     Breath sounds: Normal breath sounds.  Abdominal:     General: Bowel sounds are normal. There is no distension.     Palpations: Abdomen is soft.     Tenderness: There is no abdominal tenderness.  Musculoskeletal:     Cervical back: Normal range of motion and neck supple.     Right lower leg: No edema.     Left lower leg: No edema.  Lymphadenopathy:     Cervical: No cervical adenopathy.  Skin:    General: Skin is warm and dry.     Comments: Yeast like rash under axilla and groin area.   Neurological:     Mental Status: She is alert and oriented to person, place, and time.     Deep Tendon Reflexes: Reflexes are normal and symmetric.     Reflex Scores:      Brachioradialis reflexes are 2+ on the right side and 2+ on the left side.      Patellar reflexes are 2+ on the right side and 2+ on the left side. Psychiatric:        Attention and Perception: Attention normal.        Mood and Affect: Mood normal.        Speech: Speech normal.        Behavior: Behavior normal. Behavior is cooperative.        Thought Content: Thought content normal.     Results for orders placed or performed in visit on 01/07/23  Microscopic Examination   Collection  Time: 01/07/23  3:22 PM   Urine  Result Value Ref Range   WBC, UA 6-10 (A) 0 - 5 /hpf   RBC, Urine 11-30 (A) 0 - 2 /hpf   Epithelial Cells (non renal) 0-10 0 - 10 /hpf   Mucus, UA Present (A) Not Estab.   Bacteria, UA None seen None seen/Few  Urinalysis, Routine w reflex microscopic   Collection Time: 01/07/23  3:22 PM  Result Value Ref Range   Specific Gravity, UA 1.025 1.005 - 1.030   pH, UA 6.0 5.0 - 7.5   Color, UA Yellow Yellow   Appearance Ur Cloudy (A) Clear   Leukocytes,UA 1+ (A) Negative   Protein,UA 2+ (A) Negative/Trace   Glucose, UA Negative Negative   Ketones, UA Negative Negative   RBC, UA 3+ (A) Negative   Bilirubin, UA Negative Negative   Urobilinogen, Ur 0.2 0.2 - 1.0 mg/dL   Nitrite, UA Negative Negative   Microscopic Examination See below:   Urine Culture   Collection Time: 01/07/23  3:26 PM   Specimen: Urine   UR  Result Value Ref Range   Urine Culture, Routine Final report (A)    Organism ID, Bacteria Escherichia coli (A)    ORGANISM ID, BACTERIA Comment    Antimicrobial Susceptibility Comment       Assessment & Plan:   Problem List Items Addressed This Visit       Cardiovascular and Mediastinum   Essential hypertension   Chronic, ongoing. BP trending down today but  occasional elevations present.  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 Recommend she continue Lisinopril  daily, but to also take one pill of Hydralazine  daily with this and not take as needed only -- if tolerates and BP remains >130/80 over next few weeks increase to two pills of Hydralazine  daily.  DASH diet at home.  Continue current medication regimen.  She refuses other medications at this time and refuses to have labs drawn.      Relevant Medications   hydrALAZINE  (APRESOLINE ) 10 MG tablet     Musculoskeletal and Integument   Rash   Acute, appears fungal.  She never got Nystatin  from pharmacy at past visit, reports they did not fill.  Will resend today along  with steroid cream and abx ointment to apply to areas of possible bug bites noted as well.          Other   Mixed hyperlipidemia - Primary   Chronic, ongoing.  Not taking medication as instructed due to concern about side effects she read online, have highly recommended she restart due to risk for stroke or CVA.  Discussed side effects at length with her.  Refuses to take medication and refuses labs today.      Relevant Medications   hydrALAZINE  (APRESOLINE ) 10 MG tablet   History of benzodiazepine use   Was prescribed by Dr. Montell in past per her report, many years ago.  Discussed with her that these are not recommended in patients over 65.  Educated on alternate options, however she is adamant Xanax  is only thing that has worked, but at same time she reports not wanting to take anything addictive.  She states Xanax  were not addictive for her. Referral to psychiatry.      Relevant Orders   Ambulatory referral to Psychiatry   Depression, recurrent (HCC)   Chronic, ongoing.  Denies SI/HI.  Has strong faith in God per her report and goes to two different churches.  States Xanax  is only thing that has helped her in past and is insistent it is only thing that will work, have had discussions on long term use of this with her + addictive element, although she reports not wanting to take anything addictive or harmful for her brain.  She has tried multiple medications in past per her report, not taking Buspar . She did not take Lexapro  due to concerns for harm to her brain.  She agrees to another referral to psychiatry for further evaluation and discussions on appropriate treatment.  May benefit therapy too.      Relevant Orders   Ambulatory referral to Psychiatry   Anxiety   Refer to depression plan of care.      Relevant Orders   Ambulatory referral to Psychiatry     Follow up plan: No follow-ups on file.

## 2023-10-01 NOTE — Progress Notes (Signed)
 Complex Care Management Care Guide Note  10/01/2023 Name: Beth Parker MRN: [id number] DOB: 1955/08/14  Beth Parker is a 68 y.o. year old female who is a primary care patient of Cannady, Jolene T, NP and is actively engaged with the care management team. I reached out to Darice GORMAN Ochs by phone today to assist with re-scheduling  with the RN Case Manager BSW.  Follow up plan: Unsuccessful telephone outreach attempt made. A HIPAA compliant phone message was left for the patient providing contact information and requesting a return call.  Jeoffrey Buffalo , RMA     [facility]  [facility], [facility] Guide  Direct Dial: [phone]  Website: [email or web address]

## 2023-11-03 ENCOUNTER — Ambulatory Visit: Payer: Self-pay

## 2023-11-03 ENCOUNTER — Telehealth: Payer: Self-pay | Admitting: Nurse Practitioner

## 2023-11-03 NOTE — Telephone Encounter (Signed)
 Scheduled

## 2023-11-03 NOTE — Telephone Encounter (Signed)
 FYI Only or Action Required?: FYI only for provider.  Patient was last seen in primary care on 09/30/2023 by Cannady, Jolene T, NP.  Called Nurse Triage reporting Neck Pain.  Symptoms began several weeks ago.  Interventions attempted: Nothing.  Symptoms are: gradually worsening.  Triage Disposition: See HCP Within 4 Hours (Or PCP Triage)  Patient/caregiver understands and will follow disposition?: No, refuses disposition    Copied from CRM #[id number]. Topic: Clinical - Red Word Triage >> Nov 03, 2023  8:16 AM Emylou G wrote: [facility] that prompted transfer to Nurse Triage: right shoulder pain - radiating down her arm from elbow to hand ( concerned about her BP ) Reason for Disposition  [1] Shoulder pains with exertion (e.g., walking) AND [2] pain goes away on resting AND [3] not present now  Answer Assessment - Initial Assessment Questions Patient states she had chest pain one week ago that felt like a stabbing pain, occasional tingling. Patient states her BP has been fluctuating and this morning it was 143/77.   Patient states she refuses to see anyone other than Jolene Cannady. The patient asked that I book appt for earliest availability in November, this RN stated I was not able to book an appt that far out when recommendation is to be evaluated immediately. Patient refused UC and hung up the phone. Please contact patient if any availability to be seen or any further recommendation.  1. ONSET: When did the pain start?     Right shoulder 2. LOCATION: Where is the pain located?     Right shoulder and radiates down into hand 3. PAIN: How bad is the pain? (Scale 1-10; or mild, moderate, severe)     9 4. WORK OR EXERCISE: Has there been any recent work or exercise that involved this part of the body?     No 5. CAUSE: What do you think is causing the shoulder pain?     Patient doesn't know  6. OTHER SYMPTOMS: Do you have any other symptoms? (e.g., neck pain, swelling,  rash, fever, numbness, weakness)     Feels like it might be swollen, when I move it a certain position it gets worse  Protocols used: Shoulder Pain-A-AH

## 2023-11-03 NOTE — Telephone Encounter (Signed)
 Copied from CRM [id number]. Topic: Clinical - Red Word Triage >> Nov 03, 2023  8:16 AM Emylou G wrote: [facility] that prompted transfer to Nurse Triage: right shoulder pain - radiating down her arm from elbow to hand ( concerned about her BP )

## 2023-11-03 NOTE — Telephone Encounter (Signed)
 Copied from CRM [id number]. Topic: Appointments - Scheduling Inquiry for Clinic >> Nov 03, 2023  4:07 PM Winona R wrote: Pt refused triaged today only wants to see her pcp, Pt ended call please see previous encounters

## 2023-11-04 NOTE — Telephone Encounter (Signed)
 Noted, do recommend a visit if she calls back.

## 2023-11-06 ENCOUNTER — Encounter: Payer: Self-pay | Admitting: Nurse Practitioner

## 2023-11-06 ENCOUNTER — Ambulatory Visit: Admitting: Nurse Practitioner

## 2023-11-06 VITALS — BP 136/84 | HR 92 | Temp 97.9°F | Resp 17 | Ht 62.99 in | Wt 183.2 lb

## 2023-11-06 DIAGNOSIS — M25511 Pain in right shoulder: Secondary | ICD-10-CM

## 2023-11-06 DIAGNOSIS — G8929 Other chronic pain: Secondary | ICD-10-CM | POA: Diagnosis not present

## 2023-11-06 MED ORDER — HYDRALAZINE HCL 10 MG PO TABS: 10.0000 mg | ORAL_TABLET | Freq: Three times a day (TID) | ORAL | 4 refills | Status: AC

## 2023-11-06 MED ORDER — TRAMADOL HCL 50 MG PO TABS: 50.0000 mg | ORAL_TABLET | Freq: Three times a day (TID) | ORAL | 0 refills | Status: AC | PRN

## 2023-11-06 MED ORDER — PREDNISONE 20 MG PO TABS: 40.0000 mg | ORAL_TABLET | Freq: Every day | ORAL | 0 refills | Status: AC

## 2023-11-06 NOTE — Progress Notes (Signed)
 BP 136/84 (BP Location: Left Arm, Patient Position: Sitting, Cuff Size: Large)   Pulse 92   Temp 97.9 F (36.6 C) (Oral)   Resp 17   Ht 5' 2.99 (1.6 m)   Wt 183 lb 3.2 oz (83.1 kg)   SpO2 98%   BMI 32.46 kg/m    Subjective:    Patient ID: Beth Parker, female    DOB: 1955-09-03, 68 y.o.   MRN: [id number]  HPI: Beth Parker is a 68 y.o. female  Chief Complaint  Patient presents with   Shoulder Pain    Describes as the whole arm, right arm. Unsure of what might have caused it. Worse pain is towards the front and back and also goes into the arm. Used ES Tylenol , Aspercreme lidocaine  patches, heat/ice packs.    BP levels at home 112/62 to 153/80, often <130/80 on average  SHOULDER PAIN To right shoulder. No recent trauma.  Duration: weeks two weeks Involved shoulder: right Mechanism of injury: unknown Location: diffuse Onset:sudden Severity: 9/10  Quality:  sharp, dull, aching, and throbbing Frequency: constant aching is constant Radiation: yes numb and tingly down arm at times Aggravating factors: lifting and movement  Alleviating factors: nothing Status: fluctuating Treatments attempted: icy/hot patches, ice, APAP, and rest , heating pad Relief with NSAIDs?:  No NSAIDs Taken Weakness: no Numbness: as above Decreased grip strength: no Redness: no Swelling: no Bruising: a little to upper arm Fevers: no   Relevant past medical, surgical, family and social history reviewed and updated as indicated. Interim medical history since our last visit reviewed. Allergies and medications reviewed and updated.  Review of Systems  Constitutional:  Negative for activity change, appetite change, diaphoresis, fatigue and fever.  Respiratory:  Negative for cough, chest tightness, shortness of breath and wheezing.   Cardiovascular:  Negative for chest pain, palpitations and leg swelling.  Gastrointestinal: Negative.   Musculoskeletal:  Positive for arthralgias.   Neurological: Negative.   Psychiatric/Behavioral:  Negative for decreased concentration, self-injury, sleep disturbance and suicidal ideas. The patient is not nervous/anxious.     Per HPI unless specifically indicated above     Objective:    BP 136/84 (BP Location: Left Arm, Patient Position: Sitting, Cuff Size: Large)   Pulse 92   Temp 97.9 F (36.6 C) (Oral)   Resp 17   Ht 5' 2.99 (1.6 m)   Wt 183 lb 3.2 oz (83.1 kg)   SpO2 98%   BMI 32.46 kg/m   Wt Readings from Last 3 Encounters:  11/06/23 183 lb 3.2 oz (83.1 kg)  09/30/23 184 lb 3.2 oz (83.6 kg)  09/08/23 182 lb (82.6 kg)    Physical Exam Vitals and nursing note reviewed.  Constitutional:      General: She is awake. She is not in acute distress.    Appearance: She is well-developed and well-groomed. She is obese. She is not ill-appearing or toxic-appearing.  HENT:     Head: Normocephalic.     Right Ear: Hearing and external ear normal.     Left Ear: Hearing and external ear normal.  Eyes:     General: Lids are normal.        Right eye: No discharge.        Left eye: No discharge.     Conjunctiva/sclera: Conjunctivae normal.     Pupils: Pupils are equal, round, and reactive to light.  Neck:     Thyroid: No thyromegaly.     Vascular: No carotid bruit.  Cardiovascular:     Rate and Rhythm: Normal rate and regular rhythm.     Heart sounds: Normal heart sounds. No murmur heard.    No gallop.  Pulmonary:     Effort: Pulmonary effort is normal. No accessory muscle usage or respiratory distress.     Breath sounds: Normal breath sounds. No decreased breath sounds, wheezing or rales.  Abdominal:     General: Bowel sounds are normal. There is no distension.     Palpations: Abdomen is soft.     Tenderness: There is no abdominal tenderness.  Musculoskeletal:     Right shoulder: Tenderness (along posterior aspect.) and crepitus present. No swelling or bony tenderness. Decreased range of motion. Decreased strength.  Normal pulse.     Left shoulder: No swelling, tenderness, bony tenderness or crepitus. Normal range of motion. Normal strength. Normal pulse.     Cervical back: Normal range of motion and neck supple.     Right lower leg: No edema.     Left lower leg: No edema.     Comments: No rashes noted.  Lymphadenopathy:     Cervical: No cervical adenopathy.  Skin:    General: Skin is warm and dry.  Neurological:     Mental Status: She is alert and oriented to person, place, and time.     Deep Tendon Reflexes: Reflexes are normal and symmetric.     Reflex Scores:      Brachioradialis reflexes are 2+ on the right side and 2+ on the left side.      Patellar reflexes are 2+ on the right side and 2+ on the left side. Psychiatric:        Attention and Perception: Attention normal.        Mood and Affect: Mood normal.        Speech: Speech normal.        Behavior: Behavior normal. Behavior is cooperative.        Thought Content: Thought content normal.     Results for orders placed or performed in visit on 01/07/23  Microscopic Examination   Collection Time: 01/07/23  3:22 PM   Urine  Result Value Ref Range   WBC, UA 6-10 (A) 0 - 5 /hpf   RBC, Urine 11-30 (A) 0 - 2 /hpf   Epithelial Cells (non renal) 0-10 0 - 10 /hpf   Mucus, UA Present (A) Not Estab.   Bacteria, UA None seen None seen/Few  Urinalysis, Routine w reflex microscopic   Collection Time: 01/07/23  3:22 PM  Result Value Ref Range   Specific Gravity, UA 1.025 1.005 - 1.030   pH, UA 6.0 5.0 - 7.5   Color, UA Yellow Yellow   Appearance Ur Cloudy (A) Clear   Leukocytes,UA 1+ (A) Negative   Protein,UA 2+ (A) Negative/Trace   Glucose, UA Negative Negative   Ketones, UA Negative Negative   RBC, UA 3+ (A) Negative   Bilirubin, UA Negative Negative   Urobilinogen, Ur 0.2 0.2 - 1.0 mg/dL   Nitrite, UA Negative Negative   Microscopic Examination See below:   Urine Culture   Collection Time: 01/07/23  3:26 PM   Specimen: Urine   UR   Result Value Ref Range   Urine Culture, Routine Final report (A)    Organism ID, Bacteria Escherichia coli (A)    ORGANISM ID, BACTERIA Comment    Antimicrobial Susceptibility Comment       Assessment & Plan:   Problem List Items Addressed This Visit  Other   Shoulder pain - Primary   Acute on chronic to right shoulder, dominant side. Will start Prednisone  taper and Tramadol as needed only. Educated her on this plan of care. Discussed with patient that if ongoing pain presents then will obtain imaging and could consider steroid injection here in office or PT. No red flags on exam. Continue current OTC regimen.        Follow up plan: Return for as scheduled February 18th.

## 2023-11-06 NOTE — Patient Instructions (Signed)
 Shoulder Pain  Many things can cause shoulder pain, including:  An injury.  Moving the shoulder in the same way again and again (overuse).  Joint pain (arthritis).  Pain can come from:  Swelling and irritation (inflammation) of any part of the shoulder.  An injury to:  The shoulder joint.  Tissues that connect muscle to bone (tendons).  Tissues that connect bones to each other (ligaments).  Bones.  Follow these instructions at home:  Watch for changes in your symptoms. Let your doctor know about them. Follow these instructions to help with your pain.  If you have a sling that can be taken off:  Wear the sling as told by your doctor. Take it off only as told by your doctor.  Check the skin around the sling every day. Tell your doctor if you see problems.  Loosen the sling if your fingers:  Tingle.  Become numb.  Become cold.  Keep the sling clean.  If the sling is not waterproof:  Do not let it get wet.  Take the sling off when you shower or bathe.  Managing pain, stiffness, and swelling    If told, put ice on the painful area.  Put ice in a plastic bag.  Place a towel between your skin and the bag.  Leave the ice on for 20 minutes, 2-3 times a day. Stop putting ice on if it does not help with the pain.  If your skin turns bright red, take off the ice right away to prevent skin damage. The risk of damage is higher if you cannot feel pain, heat, or cold.  Squeeze a soft ball or a foam pad as much as possible. This prevents swelling in the shoulder. It also helps to strengthen the arm.  General instructions  Take over-the-counter and prescription medicines only as told by your doctor.  Keep all follow-up visits. This will help you avoid any type of permanent shoulder problems.  Contact a doctor if:  Your pain gets worse.  Medicine does not help your pain.  You have new pain in your arm, hand, or fingers.  You loosen your sling and your arm, hand, or fingers:  Tingle.  Are numb.  Are swollen.  Get help right away  if:  Your arm, hand, or fingers turn white or blue.  This information is not intended to replace advice given to you by your health care provider. Make sure you discuss any questions you have with your health care provider.  Document Revised: 08/10/2021 Document Reviewed: 08/10/2021  Elsevier Patient Education  2024 [employer].

## 2023-11-06 NOTE — Assessment & Plan Note (Addendum)
 Acute on chronic to right shoulder, dominant side. Will start Prednisone  taper and Tramadol as needed only. Educated her on this plan of care. Discussed with patient that if ongoing pain presents then will obtain imaging and could consider steroid injection here in office or PT. No red flags on exam. Continue current OTC regimen.

## 2023-11-19 ENCOUNTER — Ambulatory Visit: Payer: Self-pay

## 2023-11-19 ENCOUNTER — Ambulatory Visit (INDEPENDENT_AMBULATORY_CARE_PROVIDER_SITE_OTHER): Admitting: Nurse Practitioner

## 2023-11-19 ENCOUNTER — Ambulatory Visit
Admission: RE | Admit: 2023-11-19 | Discharge: 2023-11-19 | Disposition: A | Discharge status: Home / Self Care | Source: Ambulatory Visit | Attending: Nurse Practitioner | Admitting: Nurse Practitioner

## 2023-11-19 ENCOUNTER — Encounter: Payer: Self-pay | Admitting: Nurse Practitioner

## 2023-11-19 VITALS — BP 136/78 | HR 78 | Temp 98.2°F | Resp 15 | Ht 62.99 in | Wt 183.0 lb

## 2023-11-19 DIAGNOSIS — R21 Rash and other nonspecific skin eruption: Secondary | ICD-10-CM

## 2023-11-19 DIAGNOSIS — E66811 Obesity, class 1: Secondary | ICD-10-CM | POA: Diagnosis not present

## 2023-11-19 DIAGNOSIS — M25511 Pain in right shoulder: Secondary | ICD-10-CM | POA: Diagnosis not present

## 2023-11-19 DIAGNOSIS — E6609 Other obesity due to excess calories: Secondary | ICD-10-CM

## 2023-11-19 DIAGNOSIS — F5104 Psychophysiologic insomnia: Secondary | ICD-10-CM | POA: Diagnosis not present

## 2023-11-19 DIAGNOSIS — G8929 Other chronic pain: Secondary | ICD-10-CM

## 2023-11-19 DIAGNOSIS — M778 Other enthesopathies, not elsewhere classified: Secondary | ICD-10-CM | POA: Diagnosis not present

## 2023-11-19 DIAGNOSIS — S2231XK Fracture of one rib, right side, subsequent encounter for fracture with nonunion: Secondary | ICD-10-CM | POA: Diagnosis not present

## 2023-11-19 DIAGNOSIS — S42031K Displaced fracture of lateral end of right clavicle, subsequent encounter for fracture with nonunion: Secondary | ICD-10-CM | POA: Diagnosis not present

## 2023-11-19 DIAGNOSIS — Z6832 Body mass index (BMI) 32.0-32.9, adult: Secondary | ICD-10-CM

## 2023-11-19 MED ORDER — HYDROCODONE-ACETAMINOPHEN 5-325 MG PO TABS: 1.0000 | ORAL_TABLET | Freq: Three times a day (TID) | ORAL | 0 refills | Status: DC | PRN

## 2023-11-19 MED ORDER — TRIAMCINOLONE ACETONIDE 0.1 % EX CREA: 1.0000 | TOPICAL_CREAM | Freq: Two times a day (BID) | CUTANEOUS | 2 refills | Status: AC

## 2023-11-19 MED ORDER — PREDNISONE 20 MG PO TABS: 40.0000 mg | ORAL_TABLET | Freq: Every day | ORAL | 0 refills | Status: DC

## 2023-11-19 NOTE — Assessment & Plan Note (Signed)
 Chronic, ongoing.  Tried Ambien, Trazodone, and Melatonin in past with no benefit. Reports Belsomra  does not help.  States Xanax  is only thing that has helped her in past and is insistent it is only thing that will work, have had discussions on long term use of this with her + addictive element, although she reports not wanting to take anything addictive or harmful to the brain.  Discussed with her at length safe medications in patients 65 and up.  Recommend heavy focus on sleep hygiene.  Would benefit from sleep study in future, continues to decline.  Suspect some of her insomnia may be related to OSA.  Has referral to psychiatry, but has not scheduled. Provided her with number to call today.

## 2023-11-19 NOTE — Assessment & Plan Note (Signed)
 Acute on chronic to right shoulder, dominant side. Will send in a shorter steroid burst which has helped rash and pain and Norco as needed only. Educated her on this plan of care. She is to obtain imaging today. Discussed with patient that could consider steroid injection here in office or PT. No red flags on exam. Continue current OTC regimen. Will determine next steps after imaging returns.

## 2023-11-19 NOTE — Assessment & Plan Note (Signed)
BMI 32.43.  Recommended eating smaller high protein, low fat meals more frequently and exercising 30 mins a day 5 times a week with a goal of 10-15lb weight loss in the next 3 months. Patient voiced their understanding and motivation to adhere to these recommendations.

## 2023-11-19 NOTE — Assessment & Plan Note (Signed)
 Acute, improving. Will refill steroid cream today as appears to be offering benefit. Short oral steroid burst sent, n further after this as had recent dosing.

## 2023-11-19 NOTE — Progress Notes (Signed)
 BP 136/78 (BP Location: Left Arm, Patient Position: Sitting, Cuff Size: Normal)   Pulse 78   Temp 98.2 F (36.8 C) (Oral)   Resp 15   Ht 5' 2.99 (1.6 m)   Wt 183 lb (83 kg)   SpO2 97%   BMI 32.43 kg/m    Subjective:    Patient ID: Beth Parker, female    DOB: 03-06-1955, 68 y.o.   MRN: [id number]  HPI: Beth Parker is a 68 y.o. female  Chief Complaint  Patient presents with   Shoulder Pain    Right shoulder, goes from elbow to hand   Rash    Mostly on her back right now, doing better.    BP levels at home often <130/80 on average, she reports occasional elevations.  When present will take the extra Hydralazine  as recommended.  SHOULDER PAIN Presents today for ongoing issues with right shoulder pain, was seen for this on 11/06/23 and given short burst of Tramadol and Prednisone  - she reports this did not help.  Returns today for ongoing pain.  At baseline has chronic pain and history of substance abuse in past.  Saw orthopedics last on 12/12/22, with plan for MRI lumbar spine, which she has not obtained. Stopped Gabapentin  in past, she was concerned about her kidneys with this, has on kidney she reports. Has had referrals to pain management and not attended. Is right hand dominant. Duration: weeks two weeks Involved shoulder: right Mechanism of injury: unknown Location: diffuse Onset:sudden Severity: 10/10 Quality:  sharp, dull, aching, and throbbing Frequency: constant aching pain is constant Radiation: yes occasionally gets numbness down to fingers from shoulder Aggravating factors: lifting and movement  Alleviating factors: nothing Status: fluctuating Treatments attempted: icy/hot patches, ice, APAP, and rest , heating pad, Tramadol, Voltaren  gel Relief with NSAIDs?:  No NSAIDs Taken Weakness: no Numbness: as above Decreased grip strength: no Redness: no Swelling: no Bruising: no Fevers: no   RASH Ongoing issue since June on and off.  Has been treated with  steroid cream and taper. No benefit. Continues to be present, but a little bit better. Now mostly to back. Using oatmeal soap. Duration:  weeks  Location: generalized  Itching: yes Burning: no Redness: yes Oozing: no Scaling: no Blisters: no Painful: no Fevers: no Change in detergents/soaps/personal care products: no Recent illness: yes Recent travel:no History of same: no Context: fluctuating Alleviating factors: nothing Treatments attempted:hydrocortisone cream Shortness of breath: no  Throat/tongue swelling: no Myalgias/arthralgias: no   Placed referral to psychiatry in September 2025, has not scheduled. Took Xanax  in past from previous PCP in clinic many years ago and continues to report this is only thing that worked for mood and sleep.  Has been off of this for many years. We have tried Belsomra , Buspar , and Lexapro  in office which she reports do not work.   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     11/19/2023    1:29 PM 11/06/2023   11:24 AM 09/30/2023   11:20 AM 09/08/2023    1:42 PM 05/06/2023   10:24 AM  Depression screen PHQ 2/9  Decreased Interest 0 0 0 1 0  Down, Depressed, Hopeless 0 0 1 1 0  PHQ - 2 Score 0 0 1 2 0  Altered sleeping 1 1 2 1  0  Tired, decreased energy 0 0 1 2 0  Change in appetite 0 0 1 0 0  Feeling bad or failure about yourself  0 0 0 0 0  Trouble concentrating 0 0 0 0 0  Moving slowly or fidgety/restless 0 0 0 0 0  Suicidal thoughts 0 0 0 0 0  PHQ-9 Score 1 1 5 5  0  Difficult doing work/chores   Somewhat difficult Somewhat difficult Not difficult at all       11/19/2023    1:29 PM 11/06/2023   11:24 AM 09/30/2023   11:21 AM 09/08/2023    1:43 PM  GAD 7 : Generalized Anxiety Score  Nervous, Anxious, on Edge 0 0 1 1  Control/stop worrying 0 0 1 0  Worry too much - different things 0 0 1 0  Trouble relaxing 0 0 1 1  Restless 0 0 0 0  Easily annoyed or irritable 0 0 0 1  Afraid -  awful might happen 0 0 1 1  Total GAD 7 Score 0 0 5 4  Anxiety Difficulty   Somewhat difficult Somewhat difficult      Relevant past medical, surgical, family and social history reviewed and updated as indicated. Interim medical history since our last visit reviewed. Allergies and medications reviewed and updated.  Review of Systems  Constitutional:  Negative for activity change, appetite change, diaphoresis, fatigue and fever.  Respiratory:  Negative for cough, chest tightness, shortness of breath and wheezing.   Cardiovascular:  Negative for chest pain, palpitations and leg swelling.  Gastrointestinal: Negative.   Musculoskeletal:  Positive for arthralgias.  Neurological: Negative.   Psychiatric/Behavioral:  Negative for decreased concentration, self-injury, sleep disturbance and suicidal ideas. The patient is not nervous/anxious.     Per HPI unless specifically indicated above     Objective:    BP 136/78 (BP Location: Left Arm, Patient Position: Sitting, Cuff Size: Normal)   Pulse 78   Temp 98.2 F (36.8 C) (Oral)   Resp 15   Ht 5' 2.99 (1.6 m)   Wt 183 lb (83 kg)   SpO2 97%   BMI 32.43 kg/m   Wt Readings from Last 3 Encounters:  11/19/23 183 lb (83 kg)  11/06/23 183 lb 3.2 oz (83.1 kg)  09/30/23 184 lb 3.2 oz (83.6 kg)    Physical Exam Vitals and nursing note reviewed.  Constitutional:      General: She is awake. She is not in acute distress.    Appearance: She is well-developed and well-groomed. She is obese. She is not ill-appearing or toxic-appearing.  HENT:     Head: Normocephalic.     Right Ear: Hearing and external ear normal.     Left Ear: Hearing and external ear normal.  Eyes:     General: Lids are normal.        Right eye: No discharge.        Left eye: No discharge.     Conjunctiva/sclera: Conjunctivae normal.     Pupils: Pupils are equal, round, and reactive to light.  Neck:     Thyroid: No thyromegaly.     Vascular: No carotid bruit.   Cardiovascular:     Rate and Rhythm: Normal rate and regular rhythm.     Heart sounds: Normal heart sounds. No murmur heard.    No gallop.  Pulmonary:     Effort: Pulmonary effort is normal. No accessory muscle usage or respiratory distress.     Breath sounds: Normal breath sounds. No decreased breath sounds, wheezing or rales.  Abdominal:     General: Bowel sounds are normal. There  is no distension.     Palpations: Abdomen is soft.     Tenderness: There is no abdominal tenderness.  Musculoskeletal:     Right shoulder: Tenderness (along posterior aspect.) and crepitus present. No swelling or bony tenderness. Decreased range of motion. Decreased strength. Normal pulse.     Left shoulder: No swelling, tenderness, bony tenderness or crepitus. Normal range of motion. Normal strength. Normal pulse.     Cervical back: Normal range of motion and neck supple.     Right lower leg: No edema.     Left lower leg: No edema.     Comments: No rashes noted.  Lymphadenopathy:     Cervical: No cervical adenopathy.  Skin:    General: Skin is warm and dry.     Findings: Rash present.     Comments: To back her rash is improving, minimal present.  Neurological:     Mental Status: She is alert and oriented to person, place, and time.     Deep Tendon Reflexes: Reflexes are normal and symmetric.     Reflex Scores:      Brachioradialis reflexes are 2+ on the right side and 2+ on the left side.      Patellar reflexes are 2+ on the right side and 2+ on the left side. Psychiatric:        Attention and Perception: Attention normal.        Mood and Affect: Mood normal.        Speech: Speech normal.        Behavior: Behavior normal. Behavior is cooperative.        Thought Content: Thought content normal.     Results for orders placed or performed in visit on 01/07/23  Microscopic Examination   Collection Time: 01/07/23  3:22 PM   Urine  Result Value Ref Range   WBC, UA 6-10 (A) 0 - 5 /hpf   RBC, Urine  11-30 (A) 0 - 2 /hpf   Epithelial Cells (non renal) 0-10 0 - 10 /hpf   Mucus, UA Present (A) Not Estab.   Bacteria, UA None seen None seen/Few  Urinalysis, Routine w reflex microscopic   Collection Time: 01/07/23  3:22 PM  Result Value Ref Range   Specific Gravity, UA 1.025 1.005 - 1.030   pH, UA 6.0 5.0 - 7.5   Color, UA Yellow Yellow   Appearance Ur Cloudy (A) Clear   Leukocytes,UA 1+ (A) Negative   Protein,UA 2+ (A) Negative/Trace   Glucose, UA Negative Negative   Ketones, UA Negative Negative   RBC, UA 3+ (A) Negative   Bilirubin, UA Negative Negative   Urobilinogen, Ur 0.2 0.2 - 1.0 mg/dL   Nitrite, UA Negative Negative   Microscopic Examination See below:   Urine Culture   Collection Time: 01/07/23  3:26 PM   Specimen: Urine   UR  Result Value Ref Range   Urine Culture, Routine Final report (A)    Organism ID, Bacteria Escherichia coli (A)    ORGANISM ID, BACTERIA Comment    Antimicrobial Susceptibility Comment       Assessment & Plan:   Problem List Items Addressed This Visit       Musculoskeletal and Integument   Rash   Acute, improving. Will refill steroid cream today as appears to be offering benefit. Short oral steroid burst sent, n further after this as had recent dosing.        Other   Shoulder pain - Primary   Acute  on chronic to right shoulder, dominant side. Will send in a shorter steroid burst which has helped rash and pain and Norco as needed only. Educated her on this plan of care. She is to obtain imaging today. Discussed with patient that could consider steroid injection here in office or PT. No red flags on exam. Continue current OTC regimen. Will determine next steps after imaging returns.      Relevant Orders   DG Shoulder Right   Obesity   BMI 32.43.  Recommended eating smaller high protein, low fat meals more frequently and exercising 30 mins a day 5 times a week with a goal of 10-15lb weight loss in the next 3 months. Patient voiced their  understanding and motivation to adhere to these recommendations.       Insomnia   Chronic, ongoing.  Tried Ambien, Trazodone, and Melatonin in past with no benefit. Reports Belsomra  does not help.  States Xanax  is only thing that has helped her in past and is insistent it is only thing that will work, have had discussions on long term use of this with her + addictive element, although she reports not wanting to take anything addictive or harmful to the brain.  Discussed with her at length safe medications in patients 65 and up.  Recommend heavy focus on sleep hygiene.  Would benefit from sleep study in future, continues to decline.  Suspect some of her insomnia may be related to OSA.  Has referral to psychiatry, but has not scheduled. Provided her with number to call today.         Follow up plan: Return for as scheduled in February for follow-up.

## 2023-11-19 NOTE — Telephone Encounter (Signed)
 Appointment scheduled on 11/19/2023 at 1:20pm with patient's PCP Melanie Polio NP  FYI Only or Action Required?: FYI only for provider.  Patient was last seen in primary care on 11/06/2023 by Polio Melanie DASEN, NP.  Called Nurse Triage reporting Neck Pain and Shoulder Pain.  Symptoms began over a month ago.  Interventions attempted: OTC medications: Tylenol , Prescription medications: prescriptions from previous visit--patient states didn't help, Rest, hydration, or home remedies, and Ice/heat application.  Symptoms are: gradually worsening.  Triage Disposition: See PCP Within 2 Weeks  Patient/caregiver understands and will follow disposition?: Yes              Copied from CRM [id number]. Topic: Clinical - Red Word Triage >> Nov 19, 2023  8:28 AM Charlet HERO wrote: Red Word that prompted transfer to Nurse Triage: patient is calling she has pain on the right side from neck all the way down and sometimes her lower back. [facility] Reason for Disposition  [1] MILD pain (e.g., does not interfere with normal activities) AND [2] present > 7 days  Answer Assessment - Initial Assessment Questions Patient was seen recently for same symptoms and given Prednisone  taper and Tramadol---she states that these did not help any Little bumps/rash on back --- been there a long time --- patient states they itch sometimes and nothing has worked in the past for the itching    1. ONSET: When did the pain start?     Over a month 2. LOCATION: Where is the pain located?     Neck, right shoulder--down right arm and hand 3. PAIN: How bad is the pain? (Scale 1-10; or mild, moderate, severe)     10 4. WORK OR EXERCISE: Has there been any recent work or exercise that involved this part of the body?     Nothing out of the ordinary 5. CAUSE: What do you think is causing the shoulder pain?     unsure 6. OTHER SYMPTOMS: Do you have any other symptoms? (e.g., neck pain, swelling, rash,  fever, numbness, weakness)     Rash/bumps that have been there  Protocols used: Shoulder Pain-A-AH

## 2023-11-19 NOTE — Patient Instructions (Addendum)
 Referral sent to [facility] Address: [address], [place], [place] [address] Phone: [phone]    You have an order for:  []   2D Mammogram  [x]   3D Mammogram  [x]   Bone Density     Please call for appointment:  [facility]  [address] [place] [address] [phone] [facility] [address] [place], [place] [address] [phone] [facility] [address] Jewell MIRZA [place], [place] [address] [phone]   Make sure to wear two-piece clothing.  No lotions, powders, or deodorants the day of the appointment. Make sure to bring picture ID and insurance card.  Bring list of medications you are currently taking including any supplements.   Schedule your [place] screening mammogram through MyChart!   Log into your MyChart account.  Go to 'Visit' (or 'Appointments' if on mobile App) --> Schedule an Appointment  Under 'Select a Reason for Visit' choose the Mammogram Screening option.  Complete the pre-visit questions and select the time and place that best fits your schedule.

## 2023-11-21 ENCOUNTER — Ambulatory Visit: Payer: Self-pay | Admitting: Nurse Practitioner

## 2023-11-21 DIAGNOSIS — G8929 Other chronic pain: Secondary | ICD-10-CM

## 2023-11-26 ENCOUNTER — Ambulatory Visit: Payer: Self-pay | Admitting: Nurse Practitioner

## 2023-11-26 MED ORDER — HYDROCODONE-ACETAMINOPHEN 5-325 MG PO TABS: 1.0000 | ORAL_TABLET | Freq: Three times a day (TID) | ORAL | 0 refills | Status: DC | PRN

## 2023-11-26 NOTE — Telephone Encounter (Signed)
 FYI Only or Action Required?: Action required by provider: medication refill request, referral request, and update on patient condition.  Patient was last seen in primary care on 11/19/2023 by Beth Melanie DASEN, NP.  Called Nurse Triage reporting Shoulder Pain.  Symptoms began several months ago.  Interventions attempted: OTC medications: Extra Strength Tylenol  and Prescription medications: Norco and Prednisone .  Symptoms are: gradually worsening.  Triage Disposition: See PCP Within 2 Weeks  Patient/caregiver understands and will follow disposition?: Yes RN providee patient with XR results. Pt requesting Ortho referral, preferably in [facility]. Pt also requesting refill on pain medication in the interim. Pharmacy confirmed. Pt says that she does not have a working phone, currently using a friend phone- [phone]  Copied from CRM [id number]. Topic: Clinical - Red Word Triage >> Nov 26, 2023  8:24 AM Carlyon D wrote: Red Word that prompted transfer to Nurse Triage: R Shoulder/arm pain, swollen Reason for Disposition  Shoulder pain is a chronic symptom (recurrent or ongoing AND present > 4 weeks)  Answer Assessment - Initial Assessment Questions 1. ONSET: When did the pain start?     2-3 months, getting worse  2. LOCATION: Where is the pain located?     Right shoulder  3. PAIN: How bad is the pain? (Scale 1-10; or mild, moderate, severe)     Moderate to severe pain  4. WORK OR EXERCISE: Has there been any recent work or exercise that involved this part of the body?     No  5. CAUSE: What do you think is causing the shoulder pain?     Patient was unsure of exact cause. Per chart review, there was a  fracture noted and arthritis noted in the shoulder.   6. OTHER SYMPTOMS: Do you have any other symptoms? (e.g., neck pain, swelling, rash, fever, numbness, weakness)     Mild swelling  7. PREGNANCY: Is there any chance you are pregnant? When was your last  menstrual period?     no  Protocols used: Shoulder Pain-A-AH

## 2023-11-26 NOTE — Addendum Note (Signed)
 Addended by: Leialoha Hanna T on: 11/26/2023 12:23 PM   Modules accepted: Orders

## 2023-11-27 NOTE — Telephone Encounter (Signed)
 Ok for E2C2 to review.   Unable to return call to patient, number not in service. If patient calls back please review information from provider.

## 2023-11-28 ENCOUNTER — Ambulatory Visit (INDEPENDENT_AMBULATORY_CARE_PROVIDER_SITE_OTHER)

## 2023-11-28 ENCOUNTER — Telehealth: Payer: Self-pay | Admitting: Nurse Practitioner

## 2023-11-28 DIAGNOSIS — M7541 Impingement syndrome of right shoulder: Secondary | ICD-10-CM

## 2023-11-28 DIAGNOSIS — M542 Cervicalgia: Secondary | ICD-10-CM

## 2023-11-28 DIAGNOSIS — G8929 Other chronic pain: Secondary | ICD-10-CM
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
## 2023-11-28 NOTE — Patient Instructions (Signed)

## 2023-11-28 NOTE — Telephone Encounter (Signed)
 Copied from CRM #[id number]. Topic: Referral - Status >> Nov 28, 2023  9:17 AM Victoria B wrote: Reason for CRM: Patient called in about status of referral for her shoulder. She wants the referral to be sent to [facility] not [facility], and needs her xrays sent to them

## 2023-11-28 NOTE — Telephone Encounter (Signed)
 Referral team- can you guys resend this referral and information to [facility] for the patient please?

## 2023-11-30 NOTE — Patient Instructions (Incomplete)
 Shoulder Pain Many things can cause shoulder pain, including: An injury. Moving the shoulder in the same way again and again (overuse). Joint pain (arthritis). Pain can come from: Swelling and irritation (inflammation) of any part of the shoulder. An injury to: The shoulder joint. Tissues that connect muscle to bone (tendons). Tissues that connect bones to each other (ligaments). Bones. Follow these instructions at home: Watch for changes in your symptoms. Let your doctor know about them. Follow these instructions to help with your pain. If you have a sling that can be taken off: Wear the sling as told by your doctor. Take it off only as told by your doctor. Check the skin around the sling every day. Tell your doctor if you see problems. Loosen the sling if your fingers: Tingle. Become numb. Become cold. Keep the sling clean. If the sling is not waterproof: Do not let it get wet. Take the sling off when you shower or bathe. Managing pain, stiffness, and swelling  If told, put ice on the painful area. Put ice in a plastic bag. Place a towel between your skin and the bag. Leave the ice on for 20 minutes, 2-3 times a day. Stop putting ice on if it does not help with the pain. If your skin turns bright red, take off the ice right away to prevent skin damage. The risk of damage is higher if you cannot feel pain, heat, or cold. Squeeze a soft ball or a foam pad as much as possible. This prevents swelling in the shoulder. It also helps to strengthen the arm. General instructions Take over-the-counter and prescription medicines only as told by your doctor. Keep all follow-up visits. This will help you avoid any type of permanent shoulder problems. Contact a doctor if: Your pain gets worse. Medicine does not help your pain. You have new pain in your arm, hand, or fingers. You loosen your sling and your arm, hand, or fingers: Tingle. Are numb. Are swollen. Get help right away  if: Your arm, hand, or fingers turn white or blue. This information is not intended to replace advice given to you by your health care provider. Make sure you discuss any questions you have with your health care provider. Document Revised: 08/10/2021 Document Reviewed: 08/10/2021 Elsevier Patient Education  2024 [employer].

## 2023-12-01 ENCOUNTER — Ambulatory Visit: Payer: Self-pay

## 2023-12-01 NOTE — Telephone Encounter (Signed)
 FYI Only or Action Required?: FYI only for provider: appointment scheduled on 12/03/23.  Patient was last seen in primary care on 11/19/2023 by Valerio Melanie DASEN, NP.  Called Nurse Triage reporting Shoulder Pain.  Symptoms began several months ago.  Interventions attempted: Prescription medications: HYDROcodone -acetaminophen  (NORCO/VICODIN) 5-325 MG tablet, Rest, hydration, or home remedies, and Ice/heat application.  Symptoms are: stable.  Triage Disposition: See PCP When Office is Open (Within 3 Days)  Patient/caregiver understands and will follow disposition?: Yes Reason for Disposition  [1] MODERATE pain (e.g., interferes with normal activities) AND [2] present > 3 days  Answer Assessment - Initial Assessment Questions Patient states was on arthritis medication and that did not help, and is inquiring about an injection. Patient stated orthopedist was no help either. Patient has an appointment on 11/12 with PCP for shoulder. Patient was calling in to see if she could be seen sooner, no available appointments. Added patient to waitlist.  1. ONSET: When did the pain start?     Chronic, getting worse  2. LOCATION: Where is the pain located?     Started in right shoulder now its in the left  3. PAIN: How bad is the pain? (Scale 1-10; or mild, moderate, severe)     Currently 10/10  5. CAUSE: What do you think is causing the shoulder pain?     Unsure, maybe arthritis  6. OTHER SYMPTOMS: Do you have any other symptoms? (e.g., neck pain, swelling, rash, fever, numbness, weakness)     Denies  Protocols used: Shoulder Pain-A-AH  Copied from CRM [id number]. Topic: Clinical - Red Word Triage >> Dec 01, 2023 12:20 PM Joesph NOVAK wrote: Red Word that prompted transfer to Nurse Triage: shoulder/arm pain is getting worse. Still swollen.

## 2023-12-01 NOTE — Telephone Encounter (Signed)
 Noted, can discuss refills then

## 2023-12-03 ENCOUNTER — Ambulatory Visit (INDEPENDENT_AMBULATORY_CARE_PROVIDER_SITE_OTHER): Admitting: Nurse Practitioner

## 2023-12-03 ENCOUNTER — Encounter: Payer: Self-pay | Admitting: Nurse Practitioner

## 2023-12-03 VITALS — BP 133/83 | HR 81 | Temp 98.4°F | Resp 15 | Ht 62.99 in | Wt 185.6 lb

## 2023-12-03 DIAGNOSIS — M25511 Pain in right shoulder: Secondary | ICD-10-CM | POA: Diagnosis not present

## 2023-12-03 DIAGNOSIS — G8929 Other chronic pain: Secondary | ICD-10-CM | POA: Diagnosis not present

## 2023-12-03 MED ORDER — HYDROCODONE-ACETAMINOPHEN 5-325 MG PO TABS: 1.0000 | ORAL_TABLET | Freq: Three times a day (TID) | ORAL | 0 refills | Status: AC | PRN

## 2023-12-03 MED ORDER — METHYLPREDNISOLONE SODIUM SUCC 40 MG IJ SOLR
40.0000 mg | Freq: Once | INTRAMUSCULAR | Status: AC
  Administered 2023-12-03: 40 mg via INTRAMUSCULAR

## 2023-12-03 NOTE — Progress Notes (Signed)
 BP 133/83 (BP Location: Left Arm, Patient Position: Sitting, Cuff Size: Large)   Pulse 81   Temp 98.4 F (36.9 C) (Oral)   Resp 15   Ht 5' 2.99 (1.6 m)   Wt 185 lb 9.6 oz (84.2 kg)   SpO2 98%   BMI 32.89 kg/m    Subjective:    Patient ID: Beth Parker, female    DOB: Jul 05, 1955, 68 y.o.   MRN: [id number]  HPI: Beth Parker is a 68 y.o. female  Chief Complaint  Patient presents with   Shoulder Pain    Now its both shoulders.    BP levels at home often <130/80 on average, she reports rare elevations.  When present will take the extra Hydralazine  as recommended.  SHOULDER PAIN Ongoing issues with right shoulder pain, has been given short bursts of Norco, Tramadol and Prednisone . The opioids have been for short bursts and she is aware not for chronic use. Returns today for ongoing pain, which she reports is now in both shoulders. 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  At baseline has chronic pain and history of substance abuse in past. Saw orthopedics last on 11/28/23, they recommended PT or injection. She refused both. She asked for referral to [facility]. Stopped Gabapentin  in past, she was concerned about her kidneys with this, has on kidney she reports. Has had referrals to pain management and not attended. Is right hand dominant. She is very fearful of needles. Duration: weeks two weeks Involved shoulder: right Mechanism of injury: unknown Location: diffuse Onset:sudden Severity: 10/10 Quality:  sharp, dull, aching, and throbbing Frequency: constant aching pain is constant Radiation: yes occasionally gets numbness down to fingers from shoulder Aggravating factors: lifting and movement  Alleviating factors: nothing Status: fluctuating Treatments attempted: icy/hot patches, ice, APAP, and rest , heating pad, Tramadol, Voltaren  gel Relief with NSAIDs?:  No NSAIDs Taken Weakness: no Numbness:  as above Decreased grip strength: no Redness: no Swelling: no Bruising: no Fevers: no      12/03/2023    8:18 AM 11/19/2023    1:29 PM 11/06/2023   11:24 AM 09/30/2023   11:20 AM 09/08/2023    1:42 PM  Depression screen PHQ 2/9  Decreased Interest  0 0 0 1  Down, Depressed, Hopeless 0 0 0 1 1  PHQ - 2 Score 0 0 0 1 2  Altered sleeping 0 1 1 2 1   Tired, decreased energy 0 0 0 1 2  Change in appetite 0 0 0 1 0  Feeling bad or failure about yourself  0 0 0 0 0  Trouble concentrating 0 0 0 0 0  Moving slowly or fidgety/restless 0 0 0 0 0  Suicidal thoughts 0 0 0 0 0  PHQ-9 Score 0 1  1  5  5    Difficult doing work/chores    Somewhat difficult Somewhat difficult     Data saved with a previous flowsheet row definition       12/03/2023    8:18 AM 11/19/2023    1:29 PM 11/06/2023   11:24 AM 09/30/2023   11:21 AM  GAD 7 : Generalized Anxiety Score  Nervous, Anxious, on Edge 0 0 0 1  Control/stop worrying 0 0 0 1  Worry too much - different things 0 0 0 1  Trouble relaxing 0 0 0 1  Restless 0 0 0 0  Easily annoyed or irritable 0  0 0 0  Afraid - awful might happen 0 0 0 1  Total GAD 7 Score 0 0 0 5  Anxiety Difficulty    Somewhat difficult      Relevant past medical, surgical, family and social history reviewed and updated as indicated. Interim medical history since our last visit reviewed. Allergies and medications reviewed and updated.  Review of Systems  Constitutional:  Negative for activity change, appetite change, diaphoresis, fatigue and fever.  Respiratory:  Negative for cough, chest tightness, shortness of breath and wheezing.   Cardiovascular:  Negative for chest pain, palpitations and leg swelling.  Gastrointestinal: Negative.   Musculoskeletal:  Positive for arthralgias.  Neurological: Negative.   Psychiatric/Behavioral:  Negative for decreased concentration, self-injury, sleep disturbance and suicidal ideas. The patient is not nervous/anxious.     Per HPI  unless specifically indicated above     Objective:    BP 133/83 (BP Location: Left Arm, Patient Position: Sitting, Cuff Size: Large)   Pulse 81   Temp 98.4 F (36.9 C) (Oral)   Resp 15   Ht 5' 2.99 (1.6 m)   Wt 185 lb 9.6 oz (84.2 kg)   SpO2 98%   BMI 32.89 kg/m   Wt Readings from Last 3 Encounters:  12/03/23 185 lb 9.6 oz (84.2 kg)  11/19/23 183 lb (83 kg)  11/06/23 183 lb 3.2 oz (83.1 kg)    Physical Exam Vitals and nursing note reviewed.  Constitutional:      General: She is awake. She is not in acute distress.    Appearance: She is well-developed and well-groomed. She is obese. She is not ill-appearing or toxic-appearing.  HENT:     Head: Normocephalic.     Right Ear: Hearing and external ear normal.     Left Ear: Hearing and external ear normal.  Eyes:     General: Lids are normal.        Right eye: No discharge.        Left eye: No discharge.     Conjunctiva/sclera: Conjunctivae normal.     Pupils: Pupils are equal, round, and reactive to light.  Neck:     Thyroid: No thyromegaly.     Vascular: No carotid bruit.  Cardiovascular:     Rate and Rhythm: Normal rate and regular rhythm.     Heart sounds: Normal heart sounds. No murmur heard.    No gallop.  Pulmonary:     Effort: Pulmonary effort is normal. No accessory muscle usage or respiratory distress.     Breath sounds: Normal breath sounds. No decreased breath sounds, wheezing or rales.  Abdominal:     General: Bowel sounds are normal. There is no distension.     Palpations: Abdomen is soft.     Tenderness: There is no abdominal tenderness.  Musculoskeletal:     Right shoulder: Tenderness (along posterior aspect.) and crepitus present. No swelling or bony tenderness. Decreased range of motion. Decreased strength. Normal pulse.     Left shoulder: No swelling, tenderness, bony tenderness or crepitus. Normal range of motion. Normal strength. Normal pulse.     Cervical back: Normal range of motion and neck  supple.     Right lower leg: No edema.     Left lower leg: No edema.     Comments: No rashes noted.  Lymphadenopathy:     Cervical: No cervical adenopathy.  Skin:    General: Skin is warm and dry.  Neurological:     Mental Status: She is  alert and oriented to person, place, and time.     Deep Tendon Reflexes: Reflexes are normal and symmetric.     Reflex Scores:      Brachioradialis reflexes are 2+ on the right side and 2+ on the left side.      Patellar reflexes are 2+ on the right side and 2+ on the left side. Psychiatric:        Attention and Perception: Attention normal.        Mood and Affect: Mood normal.        Speech: Speech normal.        Behavior: Behavior normal. Behavior is cooperative.        Thought Content: Thought content normal.     Results for orders placed or performed in visit on 01/07/23  Microscopic Examination   Collection Time: 01/07/23  3:22 PM   Urine  Result Value Ref Range   WBC, UA 6-10 (A) 0 - 5 /hpf   RBC, Urine 11-30 (A) 0 - 2 /hpf   Epithelial Cells (non renal) 0-10 0 - 10 /hpf   Mucus, UA Present (A) Not Estab.   Bacteria, UA None seen None seen/Few  Urinalysis, Routine w reflex microscopic   Collection Time: 01/07/23  3:22 PM  Result Value Ref Range   Specific Gravity, UA 1.025 1.005 - 1.030   pH, UA 6.0 5.0 - 7.5   Color, UA Yellow Yellow   Appearance Ur Cloudy (A) Clear   Leukocytes,UA 1+ (A) Negative   Protein,UA 2+ (A) Negative/Trace   Glucose, UA Negative Negative   Ketones, UA Negative Negative   RBC, UA 3+ (A) Negative   Bilirubin, UA Negative Negative   Urobilinogen, Ur 0.2 0.2 - 1.0 mg/dL   Nitrite, UA Negative Negative   Microscopic Examination See below:   Urine Culture   Collection Time: 01/07/23  3:26 PM   Specimen: Urine   UR  Result Value Ref Range   Urine Culture, Routine Final report (A)    Organism ID, Bacteria Escherichia coli (A)    ORGANISM ID, BACTERIA Comment    Antimicrobial Susceptibility Comment        Assessment & Plan:   Problem List Items Addressed This Visit       Other   Shoulder pain - Primary   Acute on chronic to right shoulder, dominant side. Provide Solu Medrol  IM in office today and 3 days of Norco. Educated her on this plan of care. No further refills on this unless she sees ortho as recommended for discussion of steroid injection.  Concern about performing this in office as she has difficulty with needles, would benefit ortho for this. No red flags on exam. Continue current OTC regimen.       Relevant Medications   methylPREDNISolone  sodium succinate (SOLU-MEDROL ) 40 mg/mL injection 40 mg (Start on 12/03/2023  9:15 AM)       Follow up plan: Return if symptoms worsen or fail to improve.

## 2023-12-03 NOTE — Assessment & Plan Note (Signed)
 Acute on chronic to right shoulder, dominant side. Provide Solu Medrol  IM in office today and 3 days of Norco. Educated her on this plan of care. No further refills on this unless she sees ortho as recommended for discussion of steroid injection.  Concern about performing this in office as she has difficulty with needles, would benefit ortho for this. No red flags on exam. Continue current OTC regimen.

## 2023-12-24 ENCOUNTER — Ambulatory Visit: Payer: Self-pay

## 2023-12-24 NOTE — Telephone Encounter (Signed)
 Patient has been called and a message left for them to return the call to the office. Ok for E2C2 to review if/when they return the call. Please do not transfer to CAL rather send a CRM if needed only.   Please advise patient of provider message.
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
## 2023-12-25 ENCOUNTER — Ambulatory Visit: Payer: Self-pay
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
## 2023-12-25 NOTE — Telephone Encounter (Signed)
 Tried calling patient, no answer and no VM. Will try to call again.   OK for E2C2 to speak to patient and advise her of Jolene's message from the other telephone encounter if she calls back.   Jolene's message- She will need a visit and we discussed last visit no further pain medication refills. I had placed referral to [facility] and she is to see them. She could attend there walk in clinic today if pain is that severe.

## 2023-12-26 ENCOUNTER — Ambulatory Visit: Payer: Self-pay | Admitting: Nurse Practitioner

## 2023-12-26 NOTE — Telephone Encounter (Signed)
 FYI Only or Action Required?: Action required by provider: request for appointment, medication refill request, and referral request. Requesting appt in next 2 weeks, a refill on norco, referral to pain clinic and different ortho referral  Patient was last seen in primary care on 12/03/2023 by Valerio Melanie DASEN, NP.  Called Nurse Triage reporting Shoulder Pain.  Symptoms began several months ago.  Interventions attempted: OTC medications: tylenol  and Prescription medications: norco.  Symptoms are: gradually worsening.  Triage Disposition: See PCP Within 2 Weeks  Patient/caregiver understands and will follow disposition?: Yes  Copied from CRM [id number]. Topic: Clinical - Red Word Triage >> Dec 26, 2023 11:18 AM Gustabo D wrote: Pt has called multiple times saying she's in pain and asking for help and says no one has gotten back to her or gotten her help this is the 3rd time she has called. Reason for Disposition  Shoulder pain is a chronic symptom (recurrent or ongoing AND present > 4 weeks)  Answer Assessment - Initial Assessment Questions 10/10 right shoulder pain x6 months, worse over the past 3 weeks. Has seen PCP multiple times for current issue. Calling to see if norco can be refilled. Note from last provider visit states pt would need to see ortho provider first before more would be refilled.  Referral placed to [facility], pt states she is unable to go there as she owes them money. Pt asking if referral can be placed to pain management clinic. No appts with PCP in next 2 weeks, sending request for appt to f/u on referrals to ortho and pain clinic.  1. ONSET: When did the pain start?     6 months ago, worse over the past 3 weeks  2. LOCATION: Where is the pain located?     Right shoulder  3. PAIN: How bad is the pain? (Scale 1-10; or mild, moderate, severe)     Over 10/10 for the past 3 weeks  4. WORK OR EXERCISE: Has there been any recent work or exercise that involved  this part of the body?     Denies  5. CAUSE: What do you think is causing the shoulder pain?     Unsure  6. OTHER SYMPTOMS: Do you have any other symptoms? (e.g., neck pain, swelling, rash, fever, numbness, weakness)     Some swelling, occasional numbness and tingling for a while, comes and goes. Denies fever.  Protocols used: Shoulder Pain-A-AH

## 2023-12-29 NOTE — Telephone Encounter (Signed)
 Patient has been called and a message left for them to return the call to the office. Ok for E2C2 to review if/when they return the call. Please do not transfer to CAL rather send a CRM if needed only.  Attempted to reach patient to update on provider message however phone is not in service. Please advise patient that our office can not continue pain medication refills and she will need to follow up with ortho. Referral was placed in November but she can also self schedule with them. If she owes them $ and can not see them then she must let us  know she we can try another office.

## 2024-01-05 ENCOUNTER — Ambulatory Visit: Payer: Self-pay
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
## 2024-01-05 NOTE — Telephone Encounter (Signed)
 FYI Only or Action Required?: FYI only for provider: ED advised.- refused  Patient was last seen in primary care on 12/03/2023 by Beth Melanie DASEN, NP.  Called Nurse Triage reporting Pain and Rash.  Symptoms began pt was unable to give timeframe.  Interventions attempted: OTC medications: tylenol  and Ice/heat application.  Symptoms are: gradually worsening.  Triage Disposition: Go to ED Now (Notify PCP) (overriding See Physician Within 24 Hours)  Patient/caregiver understands and will follow disposition?: No, refuses disposition  from CRM [id number]. Topic: Clinical - Red Word Triage >> Jan 05, 2024  8:40 AM Willma R wrote: [facility] that prompted transfer to Nurse Triage: Patient states she is in a lot pain mostly in her right shoulder but all over, has seen for it before but states it is getting worse. Also has a rash that will not go away on her left leg and spots on her back that itch. Reason for Disposition  Numbness (i.e., loss of sensation) in hand or fingers  Answer Assessment - Initial Assessment Questions Pt complains of multiple symptoms. Right shoulder pain that shoots down her arm, states sometimes it goes to the left side also. Right arm numbness which she currently has. Pain is worse with reaching or stretching. Pain is over 10 out of 10. Does lose grip at times with that hand. Rash on her left leg and back of her  head that are itchy. States her BP has been elevated at times also. Last bp was 129/70. States she stopped some of her bp meds on her own because it was too low. States she has chest pain at times- none currently. Cramps in both legs the other day, none currently. Stopped taking tylenol  because it wasn't helping. Difficulty sleeping. Also complains of a headache. Rn advised due to headache and the arm symptoms recommendations are for her to go to the ER to be evaluated. She states the last time she was recommended to go to the ER, her friend who is a nurse said she  shouldn't have been advised to do so. Rn again gave rationale why, numbness in right arm, headache, elevated BP at times. Pt states so I can't just et an appt Rn advised no as recommendation is for her to go tot he ER. She asked about pain management. RN advised will add that to the note to see if referral will be placed. Again gave recs for ER and rationale. She states whatever and disconnected call.    1. ONSET: When did the pain start?      A long time 2. LOCATION: Where is the pain located?     Right shoulder 3. PAIN: How bad is the pain? (Scale 1-10; or mild, moderate, severe)     Over 10 4. WORK OR EXERCISE: Has there been any recent work or exercise that involved this part of the body?     unknown 5. CAUSE: What do you think is causing the shoulder pain?     unknown 6. OTHER SYMPTOMS: Do you have any other symptoms? (e.g., neck pain, swelling, rash, fever, numbness, weakness)     States possible swelling, numbness, weakness, rash on her leg  Protocols used: Shoulder Pain-A-AH

## 2024-01-06 NOTE — Telephone Encounter (Deleted)
 Copied from CRM #[id number]. Topic: Clinical - Red Word Triage >> Jan 06, 2024 10:11 AM Beth Parker wrote: Red Word that prompted transfer to Nurse Triage: Pt has a rash on her left leg and back. Pt is also reports severe shoulder pain.

## 2024-01-06 NOTE — Telephone Encounter (Signed)
 Patient called back and says she was told to call and schedule an appointment. Advised no availability at [facility], but could be seen at [facility]. She says she's not going there. Placed her on hold, call CAL at [facility] and spoke to [facility]. She says the earliest is on Monday 01/12/24 at 1340 with Darice, patient agreed to that appointment, Iris scheduled. Patient asked what if the rash is gone by then, she will still need to be seen for the pain. Advised to come in for the appointment for evaluation of the pain and the rash, even if it's cleared up. She says now she has a spot in her head. Advised ED/UC if symptoms worsen. She says she will not do that.     Copied from CRM #[id number]. Topic: Clinical - Red Word Triage >> Jan 06, 2024 10:11 AM Antwanette L wrote: Red Word that prompted transfer to Nurse Triage: Pt has a rash on her left leg and back. Pt is also reports severe shoulder pain.

## 2024-01-06 NOTE — Telephone Encounter (Signed)
 This encounter was created in error - please disregard.

## 2024-01-06 NOTE — Telephone Encounter (Signed)
 Called patient and left a message to call back to get scheduled.

## 2024-01-06 NOTE — Telephone Encounter (Signed)
 Patient has been called and a message left for them to return the call to the office. Ok for E2C2 to review if/when they return the call. Please do not transfer to CAL rather send a CRM if needed only.   Unable to reach patient, call is not going through.

## 2024-01-12 ENCOUNTER — Ambulatory Visit: Admitting: Nurse Practitioner

## 2024-01-12 ENCOUNTER — Encounter: Payer: Self-pay | Admitting: Nurse Practitioner

## 2024-01-12 VITALS — BP 156/90 | HR 77 | Temp 97.5°F | Ht 62.99 in | Wt 188.2 lb

## 2024-01-12 DIAGNOSIS — M25511 Pain in right shoulder: Secondary | ICD-10-CM

## 2024-01-12 DIAGNOSIS — G8929 Other chronic pain: Secondary | ICD-10-CM

## 2024-01-12 MED ORDER — METHYLPREDNISOLONE 4 MG PO TBPK: ORAL_TABLET | ORAL | 0 refills | Status: AC

## 2024-01-12 NOTE — Patient Instructions (Addendum)
 [facility]: [phone] [facility]: [phone]

## 2024-01-12 NOTE — Progress Notes (Signed)
 "  BP (!) 156/90 (BP Location: Left Arm, Cuff Size: Large)   Pulse 77   Temp (!) 97.5 F (36.4 C) (Oral)   Ht 5' 2.99 (1.6 m)   Wt 188 lb 3.2 oz (85.4 kg)   SpO2 94%   BMI 33.35 kg/m    Subjective:    Patient ID: Beth Parker, female    DOB: 10-Jan-1956, 68 y.o.   MRN: [id number]  HPI: [name] is a 68 y.o. female  Chief Complaint  Patient presents with   office visit   Rash    Patient has a red, itchy rash on both legs.    Shoulder Pain    Patient stated she's been seen twice by Jolene for her shoulder pain and an old wound in it.   SHOULDER PAIN Ongoing issues with right shoulder pain, has been given short bursts of Norco, Tramadol  and Prednisone . The opioids have been for short bursts and she is aware not for chronic use. Returns today for ongoing pain, which she reports is now in both shoulders. 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  At baseline has chronic pain and history of substance abuse in past. Saw orthopedics last on 11/28/23, they recommended PT or injection. She refused both. She asked for referral to [facility]. Stopped Gabapentin  in past, she was concerned about her kidneys with this, has on kidney she reports. Has had referrals to pain management and not attended. Is right hand dominant. She is very fearful of needles.  Patient states she was seen by [facility]. She has not seen Orthopedics since her last visit in this office.  Reports the pain is ongoing.  She tolerated the Norco well and is requesting a refill.  Can't go see [facility] because she owes them money.  Did not want to do the injection with [facility] due to the risk of possibly needing surgery after.  Duration: weeks two weeks Involved shoulder: right Mechanism of injury: unknown Location: diffuse Onset:sudden Severity: 10/10 Quality:  sharp, dull, aching, and throbbing Frequency: constant aching pain is  constant Radiation: yes occasionally gets numbness down to fingers from shoulder Aggravating factors: lifting and movement  Alleviating factors: nothing Status: fluctuating Treatments attempted: icy/hot patches, ice, APAP, and rest , heating pad, Tramadol , Voltaren  gel Relief with NSAIDs?:  No NSAIDs Taken Weakness: no Numbness: as above Decreased grip strength: no Redness: no Swelling: no Bruising: no Fevers: no      12/03/2023    8:18 AM 11/19/2023    1:29 PM 11/06/2023   11:24 AM 09/30/2023   11:20 AM 09/08/2023    1:42 PM  Depression screen PHQ 2/9  Decreased Interest  0 0 0 1  Down, Depressed, Hopeless 0 0 0 1 1  PHQ - 2 Score 0 0 0 1 2  Altered sleeping 0 1 1 2 1   Tired, decreased energy 0 0 0 1 2  Change in appetite 0 0 0 1 0  Feeling bad or failure about yourself  0 0 0 0 0  Trouble concentrating 0 0 0 0 0  Moving slowly or fidgety/restless 0 0 0 0 0  Suicidal thoughts 0 0 0 0 0  PHQ-9 Score 0 1  1  5  5    Difficult doing work/chores    Somewhat difficult Somewhat difficult     Data saved with a previous flowsheet row definition  12/03/2023    8:18 AM 11/19/2023    1:29 PM 11/06/2023   11:24 AM 09/30/2023   11:21 AM  GAD 7 : Generalized Anxiety Score  Nervous, Anxious, on Edge 0 0 0 1  Control/stop worrying 0 0 0 1  Worry too much - different things 0 0 0 1  Trouble relaxing 0 0 0 1  Restless 0 0 0 0  Easily annoyed or irritable 0 0 0 0  Afraid - awful might happen 0 0 0 1  Total GAD 7 Score 0 0 0 5  Anxiety Difficulty    Somewhat difficult      Relevant past medical, surgical, family and social history reviewed and updated as indicated. Interim medical history since our last visit reviewed. Allergies and medications reviewed and updated.  Review of Systems  Constitutional:  Negative for activity change, appetite change, diaphoresis, fatigue and fever.  Respiratory:  Negative for cough, chest tightness, shortness of breath and wheezing.    Cardiovascular:  Negative for chest pain, palpitations and leg swelling.  Gastrointestinal: Negative.   Musculoskeletal:  Positive for arthralgias.  Neurological: Negative.   Psychiatric/Behavioral:  Negative for decreased concentration, self-injury, sleep disturbance and suicidal ideas. The patient is not nervous/anxious.     Per HPI unless specifically indicated above     Objective:    BP (!) 156/90 (BP Location: Left Arm, Cuff Size: Large)   Pulse 77   Temp (!) 97.5 F (36.4 C) (Oral)   Ht 5' 2.99 (1.6 m)   Wt 188 lb 3.2 oz (85.4 kg)   SpO2 94%   BMI 33.35 kg/m   Wt Readings from Last 3 Encounters:  01/12/24 188 lb 3.2 oz (85.4 kg)  12/03/23 185 lb 9.6 oz (84.2 kg)  11/19/23 183 lb (83 kg)    Physical Exam Vitals and nursing note reviewed.  Constitutional:      General: She is awake. She is not in acute distress.    Appearance: She is well-developed and well-groomed. She is obese. She is not ill-appearing or toxic-appearing.  HENT:     Head: Normocephalic.     Right Ear: Hearing and external ear normal.     Left Ear: Hearing and external ear normal.  Eyes:     General: Lids are normal.        Right eye: No discharge.        Left eye: No discharge.     Conjunctiva/sclera: Conjunctivae normal.     Pupils: Pupils are equal, round, and reactive to light.  Neck:     Thyroid: No thyromegaly.     Vascular: No carotid bruit.  Cardiovascular:     Rate and Rhythm: Normal rate and regular rhythm.     Heart sounds: Normal heart sounds. No murmur heard.    No gallop.  Pulmonary:     Effort: Pulmonary effort is normal. No accessory muscle usage or respiratory distress.     Breath sounds: Normal breath sounds. No decreased breath sounds, wheezing or rales.  Abdominal:     General: Bowel sounds are normal. There is no distension.     Palpations: Abdomen is soft.     Tenderness: There is no abdominal tenderness.  Musculoskeletal:     Right shoulder: Tenderness (along  posterior aspect.) and crepitus present. No swelling or bony tenderness. Decreased range of motion. Decreased strength. Normal pulse.     Left shoulder: No swelling, tenderness, bony tenderness or crepitus. Normal range of motion. Normal strength. Normal pulse.  Cervical back: Normal range of motion and neck supple.     Right lower leg: No edema.     Left lower leg: No edema.     Comments: No rashes noted.  Lymphadenopathy:     Cervical: No cervical adenopathy.  Skin:    General: Skin is warm and dry.  Neurological:     Mental Status: She is alert and oriented to person, place, and time.     Deep Tendon Reflexes: Reflexes are normal and symmetric.     Reflex Scores:      Brachioradialis reflexes are 2+ on the right side and 2+ on the left side.      Patellar reflexes are 2+ on the right side and 2+ on the left side. Psychiatric:        Attention and Perception: Attention normal.        Mood and Affect: Mood normal.        Speech: Speech normal.        Behavior: Behavior normal. Behavior is cooperative.        Thought Content: Thought content normal.     Results for orders placed or performed in visit on 01/07/23  Microscopic Examination   Collection Time: 01/07/23  3:22 PM   Urine  Result Value Ref Range   WBC, UA 6-10 (A) 0 - 5 /hpf   RBC, Urine 11-30 (A) 0 - 2 /hpf   Epithelial Cells (non renal) 0-10 0 - 10 /hpf   Mucus, UA Present (A) Not Estab.   Bacteria, UA None seen None seen/Few  Urinalysis, Routine w reflex microscopic   Collection Time: 01/07/23  3:22 PM  Result Value Ref Range   Specific Gravity, UA 1.025 1.005 - 1.030   pH, UA 6.0 5.0 - 7.5   Color, UA Yellow Yellow   Appearance Ur Cloudy (A) Clear   Leukocytes,UA 1+ (A) Negative   Protein,UA 2+ (A) Negative/Trace   Glucose, UA Negative Negative   Ketones, UA Negative Negative   RBC, UA 3+ (A) Negative   Bilirubin, UA Negative Negative   Urobilinogen, Ur 0.2 0.2 - 1.0 mg/dL   Nitrite, UA Negative  Negative   Microscopic Examination See below:   Urine Culture   Collection Time: 01/07/23  3:26 PM   Specimen: Urine   UR  Result Value Ref Range   Urine Culture, Routine Final report (A)    Organism ID, Bacteria Escherichia coli (A)    ORGANISM ID, BACTERIA Comment    Antimicrobial Susceptibility Comment       Assessment & Plan:   Problem List Items Addressed This Visit   None       Follow up plan: No follow-ups on file.      "

## 2024-01-12 NOTE — Assessment & Plan Note (Signed)
 Acute on chronic to right shoulder, dominant side. Has not seen Ortho since last visit.  Requested refill of Norco.  Unable to provide due to chronic nature of pain.  Referral placed for [facility] ortho and Pain Management at patient's request.  No red flags on exam. Continue current OTC regimen.

## 2024-03-10 ENCOUNTER — Ambulatory Visit: Admitting: Nurse Practitioner
